# Patient Record
Sex: Male | Born: 1995 | Hispanic: No | Marital: Single
Health system: Southern US, Community
[De-identification: ages and names within clinical notes are randomized; demographics above are authoritative.]

## PROBLEM LIST (undated history)

## (undated) ENCOUNTER — Ambulatory Visit (HOSPITAL_COMMUNITY): Discharge status: Absent without leave | Payer: Self-pay

## (undated) ENCOUNTER — Ambulatory Visit (HOSPITAL_COMMUNITY): Admission: EM | Payer: Self-pay | Source: Home / Self Care

## (undated) DIAGNOSIS — E669 Obesity, unspecified: Secondary | ICD-10-CM

## (undated) DIAGNOSIS — S83106A Unspecified dislocation of unspecified knee, initial encounter: Secondary | ICD-10-CM

## (undated) DIAGNOSIS — F419 Anxiety disorder, unspecified: Secondary | ICD-10-CM

## (undated) HISTORY — DX: Anxiety disorder, unspecified: F41.9

## (undated) HISTORY — PX: TONSILLECTOMY: SUR1361

---
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
## 2016-05-15 ENCOUNTER — Encounter (HOSPITAL_COMMUNITY): Payer: Self-pay

## 2016-05-15 DIAGNOSIS — Z79899 Other long term (current) drug therapy: Secondary | ICD-10-CM | POA: Insufficient documentation

## 2016-05-15 DIAGNOSIS — K6289 Other specified diseases of anus and rectum: Secondary | ICD-10-CM | POA: Insufficient documentation

## 2016-05-15 LAB — CBC
HEMATOCRIT: 45.7 % (ref 39.0–52.0)
Hemoglobin: 14.2 g/dL (ref 13.0–17.0)
MCH: 25.4 pg — AB (ref 26.0–34.0)
MCHC: 31.1 g/dL (ref 30.0–36.0)
MCV: 81.9 fL (ref 78.0–100.0)
Platelets: 559 10*3/uL — ABNORMAL HIGH (ref 150–400)
RBC: 5.58 MIL/uL (ref 4.22–5.81)
RDW: 15.1 % (ref 11.5–15.5)
WBC: 10.6 10*3/uL — AB (ref 4.0–10.5)

## 2016-05-15 LAB — URINALYSIS, ROUTINE W REFLEX MICROSCOPIC
Bilirubin Urine: NEGATIVE
GLUCOSE, UA: NEGATIVE mg/dL
HGB URINE DIPSTICK: NEGATIVE
Ketones, ur: NEGATIVE mg/dL
LEUKOCYTES UA: NEGATIVE
Nitrite: NEGATIVE
PH: 6 (ref 5.0–8.0)
Protein, ur: NEGATIVE mg/dL
SPECIFIC GRAVITY, URINE: 1.026 (ref 1.005–1.030)

## 2016-05-15 LAB — COMPREHENSIVE METABOLIC PANEL
ALT: 23 U/L (ref 17–63)
AST: 21 U/L (ref 15–41)
Albumin: 3.8 g/dL (ref 3.5–5.0)
Alkaline Phosphatase: 80 U/L (ref 38–126)
Anion gap: 5 (ref 5–15)
CHLORIDE: 108 mmol/L (ref 101–111)
CO2: 26 mmol/L (ref 22–32)
Calcium: 9.1 mg/dL (ref 8.9–10.3)
Creatinine, Ser: 0.65 mg/dL (ref 0.61–1.24)
Glucose, Bld: 111 mg/dL — ABNORMAL HIGH (ref 65–99)
POTASSIUM: 3.9 mmol/L (ref 3.5–5.1)
SODIUM: 139 mmol/L (ref 135–145)
Total Bilirubin: 0.2 mg/dL — ABNORMAL LOW (ref 0.3–1.2)
Total Protein: 7 g/dL (ref 6.5–8.1)

## 2016-05-15 LAB — LIPASE, BLOOD: LIPASE: 22 U/L (ref 11–51)

## 2016-05-15 MED ORDER — OXYCODONE-ACETAMINOPHEN 5-325 MG PO TABS
1.0000 | ORAL_TABLET | ORAL | Status: DC | PRN
  Administered 2016-05-15: 1 via ORAL

## 2016-05-15 MED ORDER — OXYCODONE-ACETAMINOPHEN 5-325 MG PO TABS
ORAL_TABLET | ORAL | Status: AC
  Filled 2016-05-15: qty 1

## 2016-05-15 NOTE — ED Triage Notes (Signed)
Pt complaining of abdominal pain and rectal pain x 2 weeks. Pt denies any bleeding. Pt states painful to have bowel movement.

## 2016-05-16 ENCOUNTER — Emergency Department (HOSPITAL_COMMUNITY)
Admission: EM | Admit: 2016-05-16 | Discharge: 2016-05-16 | Disposition: A | Discharge status: Home / Self Care | Payer: Medicaid Other | Attending: Emergency Medicine | Admitting: Emergency Medicine

## 2016-05-16 ENCOUNTER — Emergency Department (HOSPITAL_COMMUNITY): Payer: Medicaid Other

## 2016-05-16 DIAGNOSIS — K6289 Other specified diseases of anus and rectum: Secondary | ICD-10-CM

## 2016-05-16 MED ORDER — ONDANSETRON HCL 4 MG/2ML IJ SOLN
4.0000 mg | Freq: Once | INTRAMUSCULAR | Status: AC
  Administered 2016-05-16: 4 mg via INTRAVENOUS
  Filled 2016-05-16: qty 2

## 2016-05-16 MED ORDER — IOPAMIDOL (ISOVUE-300) INJECTION 61%
INTRAVENOUS | Status: AC
  Administered 2016-05-16: 100 mL
  Filled 2016-05-16: qty 100

## 2016-05-16 MED ORDER — TRAMADOL HCL 50 MG PO TABS: 50.0000 mg | ORAL_TABLET | Freq: Four times a day (QID) | ORAL | 0 refills | Status: DC | PRN

## 2016-05-16 MED ORDER — HYDROCORTISONE ACETATE 25 MG RE SUPP: 25.0000 mg | Freq: Two times a day (BID) | RECTAL | 0 refills | Status: DC

## 2016-05-16 MED ORDER — DOCUSATE SODIUM 100 MG PO CAPS: 100.0000 mg | ORAL_CAPSULE | Freq: Two times a day (BID) | ORAL | 0 refills | Status: DC

## 2016-05-16 MED ORDER — SODIUM CHLORIDE 0.9 % IV BOLUS (SEPSIS)
1000.0000 mL | Freq: Once | INTRAVENOUS | Status: AC
  Administered 2016-05-16: 1000 mL via INTRAVENOUS

## 2016-05-16 MED ORDER — HYDROMORPHONE HCL 1 MG/ML IJ SOLN
1.0000 mg | Freq: Once | INTRAMUSCULAR | Status: AC
  Administered 2016-05-16: 1 mg via INTRAVENOUS
  Filled 2016-05-16: qty 1

## 2016-05-16 NOTE — ED Notes (Signed)
Patient ambulated to the room independently.   

## 2016-05-16 NOTE — ED Provider Notes (Signed)
[facility]-EMERGENCY DEPT Provider Note   CSN: [id number] Arrival date & time: 05/15/16  1914  By signing my name below, I, Emmanuella Mensah, attest that this documentation has been prepared under the direction and in the presence of Gilda Creasehristopher J Halona Amstutz, MD. Electronically Signed: Angelene GiovanniEmmanuella Mensah, ED Scribe. 05/16/16. 1:03 AM.    History   Chief Complaint Chief Complaint  Patient presents with  . Abdominal Pain  . Rectal Pain    HPI Comments: Devin Barker is a 20 y.o. male who presents to the Emergency Department complaining of gradually worsening moderate left lateral side pain that radiates to his left back onset 2 weeks ago. He reports associated nausea and a stabbing, itchy rectal pain. He adds that he has painful BM with blood. No alleviating factors noted. Pt has not tried any medications PTA. No hx of hemorrhoids. He denies any fever, chills, vomiting, diarrhea, SOB, or any generalized rash.    The history is provided by the patient. No language interpreter was used.  Abdominal Pain  Associated symptoms include nausea. Pertinent negatives include fever, diarrhea and vomiting.    Past Medical History:  Diagnosis Date  . Knee dislocation   . Obesity     Patient Active Problem List   Diagnosis Date Noted  . 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 MEDIAL PATELLOFEMORAL LIGAMENT REPAIR Right 10/16/2013   Procedure: ARTHROSCOPY, RIGHT OPEN RECONSTRUCTION AT THE MEDIAL PATELLA FEMORAL LIGAMENT;  Surgeon: Senaida LangeKevin M Supple, MD;  Location: [facility] OR;  Service: Orthopedics;  Laterality: Right;  ANESTHESIA: GENERAL/FEMORAL NERVE BLOCK  . TONSILLECTOMY         Home Medications    Prior to Admission medications   Medication Sig Start Date End Date Taking? Authorizing Provider  diazepam (VALIUM) 5 MG tablet Take 0.5-1 tablets (2.5-5 mg total) by mouth every 6 (six) hours as needed for muscle spasms or  sedation. Patient not taking: Reported on 08/10/2014 10/16/13   Ralene Batheracy Shuford, PA-C  naproxen (NAPROSYN) 500 MG tablet Take 1 tablet (500 mg total) by mouth 2 (two) times daily with a meal. Patient not taking: Reported on 08/10/2014 10/16/13   Ralene Batheracy Shuford, PA-C  oxyCODONE-acetaminophen (PERCOCET) 5-325 MG per tablet Take 1-2 tablets by mouth every 4 (four) hours as needed. Patient not taking: Reported on 08/10/2014 10/16/13   Ralene Batheracy Shuford, PA-C    Family History Family History  Problem Relation Age of Onset  . Diabetes Mother   . Hyperlipidemia Mother   . Hypertension Father   . Kidney disease Sister   . Diabetes Maternal Grandmother   . Diabetes Paternal Grandfather     Social History Social History  Substance Use Topics  . Smoking status: Never Smoker  . Smokeless tobacco: Never Used  . Alcohol use No     Allergies   Other   Review of Systems Review of Systems  Constitutional: Negative for chills and fever.  Respiratory: Negative for shortness of breath.   Gastrointestinal: Positive for abdominal pain and nausea. Negative for diarrhea and vomiting.  Skin: Negative for rash.  All other systems reviewed and are negative.    Physical Exam Updated Vital Signs BP 151/86 (BP Location: Right Arm)   Pulse 91   Temp 98.5 F (36.9 C) (Oral)   Resp 20   Ht 5\' 7"  (1.702 m)   Wt 259 lb 5 oz (117.6 kg)   SpO2 99%   BMI 40.61 kg/m   Physical Exam  Constitutional:  He is oriented to person, place, and time. He appears well-developed and well-nourished. No distress.  HENT:  Head: Normocephalic and atraumatic.  Right Ear: Hearing normal.  Left Ear: Hearing normal.  Nose: Nose normal.  Mouth/Throat: Oropharynx is clear and moist and mucous membranes are normal.  Eyes: Conjunctivae and EOM are normal. Pupils are equal, round, and reactive to light.  Neck: Normal range of motion. Neck supple.  Cardiovascular: Regular rhythm, S1 normal and S2 normal.  Exam reveals no  gallop and no friction rub.   No murmur heard. Pulmonary/Chest: Effort normal and breath sounds normal. No respiratory distress. He exhibits no tenderness.  Abdominal: Soft. Normal appearance and bowel sounds are normal. There is no hepatosplenomegaly. There is no tenderness. There is no rebound, no guarding, no tenderness at McBurney's point and negative Murphy's sign. No hernia.  Genitourinary:  Genitourinary Comments: Chaperone present Peri-anal tenderness without fluctuance, abscess, or mass.   Musculoskeletal: Normal range of motion.  Neurological: He is alert and oriented to person, place, and time. He has normal strength. No cranial nerve deficit or sensory deficit. Coordination normal. GCS eye subscore is 4. GCS verbal subscore is 5. GCS motor subscore is 6.  Skin: Skin is warm, dry and intact. No rash noted. No cyanosis.  Psychiatric: He has a normal mood and affect. His speech is normal and behavior is normal. Thought content normal.  Nursing note and vitals reviewed.    ED Treatments / Results  DIAGNOSTIC STUDIES: Oxygen Saturation is 100% on RA, normal by my interpretation.    COORDINATION OF CARE: 1:02 AM- Pt advised of plan for treatment and pt agrees. Pt will receive lab work and CT scan for further evaluation.    Labs (all labs ordered are listed, but only abnormal results are displayed) Labs Reviewed  COMPREHENSIVE METABOLIC PANEL - Abnormal; Notable for the following:       Result Value   Glucose, Bld 111 (*)    BUN <5 (*)    Total Bilirubin 0.2 (*)    All other components within normal limits  CBC - Abnormal; Notable for the following:    WBC 10.6 (*)    MCH 25.4 (*)    Platelets 559 (*)    All other components within normal limits  LIPASE, BLOOD  URINALYSIS, ROUTINE W REFLEX MICROSCOPIC (NOT AT [facility])    EKG  EKG Interpretation None       Radiology Ct Abdomen Pelvis W Contrast  Result Date: 05/16/2016 CLINICAL DATA:  Nausea and left-sided abdominal  pain. Onset 2 weeks ago. EXAM: CT ABDOMEN AND PELVIS WITH CONTRAST TECHNIQUE: Multidetector CT imaging of the abdomen and pelvis was performed using the standard protocol following bolus administration of intravenous contrast. CONTRAST:  ISOVUE-300 IOPAMIDOL (ISOVUE-300) INJECTION 61% COMPARISON:  None. FINDINGS: Lower chest:  No significant abnormality Hepatobiliary: Mild fatty infiltration of the liver. No focal liver lesions. Gallbladder and bile ducts are unremarkable. Pancreas: Normal Spleen: Normal Adrenals/Urinary Tract: The adrenals and kidneys are normal in appearance. There is no urinary calculus evident. There is no hydronephrosis or ureteral dilatation. Collecting systems and ureters appear unremarkable. The urinary bladder is unremarkable. Stomach/Bowel: There are normal appearances of the stomach, small bowel and colon. The appendix is normal. Vascular/Lymphatic: The abdominal aorta is normal in caliber. There is no atherosclerotic calcification. There is no adenopathy in the abdomen or pelvis. Reproductive: Unremarkable Other: No acute inflammatory changes are evident in the abdomen or pelvis. There is no ascites. Musculoskeletal: No significant skeletal  lesions. IMPRESSION: Fatty infiltration of the liver.  No acute findings are evident. Electronically Signed   By: Ellery Plunk M.D.   On: 05/16/2016 03:54    Procedures Procedures (including critical care time)  Medications Ordered in ED Medications  oxyCODONE-acetaminophen (PERCOCET/ROXICET) 5-325 MG per tablet 1 tablet (1 tablet Oral Given 05/15/16 2152)  sodium chloride 0.9 % bolus 1,000 mL (0 mLs Intravenous Stopped 05/16/16 0232)  ondansetron (ZOFRAN) injection 4 mg (4 mg Intravenous Given 05/16/16 0142)  HYDROmorphone (DILAUDID) injection 1 mg (1 mg Intravenous Given 05/16/16 0142)  iopamidol (ISOVUE-300) 61 % injection (100 mLs  Contrast Given 05/16/16 9604)     Initial Impression / Assessment and Plan / ED Course   Gilda Crease, MD has reviewed the triage vital signs and the nursing notes.  Pertinent labs & imaging results that were available during my care of the patient were reviewed by me and considered in my medical decision making (see chart for details).  Clinical Course   Patient presents to the emergency department with complaints of rectal pain. He reports experiencing bilateral upper abdominal pain with lower back pain. The pain in these areas is mild and now he is experiencing severe and constant pain in his rectum. This pain worsens when he tries to have a bowel movement. He has not noticed any rectal bleeding. Examination did not reveal any external thrombosed or inflamed hemorrhoids. No tenderness of the Botox to suggest soft tissue abscess. He could not tolerate digital rectal exam. I did not appreciate any obvious fissures.  Patient underwent CT scan to further evaluate for the possibility of deep abscess and proctitis. No acute abnormality was noted. Patient will be treated with stool softener, analgesia, rectal steroids. Follow-up with general surgery.  Final Clinical Impressions(s) / ED Diagnoses   Final diagnoses:  Rectal pain    New Prescriptions New Prescriptions   No medications on file   I personally performed the services described in this documentation, which was scribed in my presence. The recorded information has been reviewed and is accurate.    Gilda Crease, MD 05/16/16 [phone]

## 2016-12-08 ENCOUNTER — Emergency Department (HOSPITAL_COMMUNITY): Payer: Medicaid Other

## 2016-12-08 ENCOUNTER — Encounter (HOSPITAL_COMMUNITY): Payer: Self-pay

## 2016-12-08 ENCOUNTER — Emergency Department (HOSPITAL_COMMUNITY)
Admission: EM | Admit: 2016-12-08 | Discharge: 2016-12-08 | Disposition: A | Discharge status: Home / Self Care | Payer: Medicaid Other | Attending: Emergency Medicine | Admitting: Emergency Medicine

## 2016-12-08 DIAGNOSIS — E876 Hypokalemia: Secondary | ICD-10-CM | POA: Diagnosis not present

## 2016-12-08 DIAGNOSIS — R072 Precordial pain: Secondary | ICD-10-CM | POA: Diagnosis not present

## 2016-12-08 DIAGNOSIS — K2960 Other gastritis without bleeding: Secondary | ICD-10-CM

## 2016-12-08 DIAGNOSIS — Z720 Tobacco use: Secondary | ICD-10-CM

## 2016-12-08 DIAGNOSIS — R1012 Left upper quadrant pain: Secondary | ICD-10-CM | POA: Insufficient documentation

## 2016-12-08 DIAGNOSIS — Z79899 Other long term (current) drug therapy: Secondary | ICD-10-CM | POA: Insufficient documentation

## 2016-12-08 DIAGNOSIS — R0602 Shortness of breath: Secondary | ICD-10-CM | POA: Diagnosis not present

## 2016-12-08 DIAGNOSIS — R079 Chest pain, unspecified: Secondary | ICD-10-CM | POA: Diagnosis present

## 2016-12-08 DIAGNOSIS — R110 Nausea: Secondary | ICD-10-CM

## 2016-12-08 LAB — CBC WITH DIFFERENTIAL/PLATELET
BASOS PCT: 1 %
Basophils Absolute: 0.1 10*3/uL (ref 0.0–0.1)
EOS ABS: 0.3 10*3/uL (ref 0.0–0.7)
Eosinophils Relative: 2 %
HCT: 43.7 % (ref 39.0–52.0)
HEMOGLOBIN: 14.5 g/dL (ref 13.0–17.0)
Lymphocytes Relative: 30 %
Lymphs Abs: 3.7 10*3/uL (ref 0.7–4.0)
MCH: 25.8 pg — ABNORMAL LOW (ref 26.0–34.0)
MCHC: 33.2 g/dL (ref 30.0–36.0)
MCV: 77.6 fL — ABNORMAL LOW (ref 78.0–100.0)
MONOS PCT: 6 %
Monocytes Absolute: 0.7 10*3/uL (ref 0.1–1.0)
NEUTROS PCT: 61 %
Neutro Abs: 7.5 10*3/uL (ref 1.7–7.7)
Platelets: 473 10*3/uL — ABNORMAL HIGH (ref 150–400)
RBC: 5.63 MIL/uL (ref 4.22–5.81)
RDW: 16.1 % — ABNORMAL HIGH (ref 11.5–15.5)
WBC: 12.2 10*3/uL — AB (ref 4.0–10.5)

## 2016-12-08 LAB — COMPREHENSIVE METABOLIC PANEL
ALK PHOS: 70 U/L (ref 38–126)
ALT: 23 U/L (ref 17–63)
ANION GAP: 9 (ref 5–15)
AST: 18 U/L (ref 15–41)
Albumin: 4.1 g/dL (ref 3.5–5.0)
BILIRUBIN TOTAL: 0.5 mg/dL (ref 0.3–1.2)
BUN: 8 mg/dL (ref 6–20)
CALCIUM: 8.9 mg/dL (ref 8.9–10.3)
CO2: 21 mmol/L — ABNORMAL LOW (ref 22–32)
Chloride: 101 mmol/L (ref 101–111)
Creatinine, Ser: 0.5 mg/dL — ABNORMAL LOW (ref 0.61–1.24)
Glucose, Bld: 67 mg/dL (ref 65–99)
Potassium: 3.3 mmol/L — ABNORMAL LOW (ref 3.5–5.1)
Sodium: 131 mmol/L — ABNORMAL LOW (ref 135–145)
TOTAL PROTEIN: 8.1 g/dL (ref 6.5–8.1)

## 2016-12-08 LAB — I-STAT TROPONIN, ED: Troponin i, poc: 0 ng/mL (ref 0.00–0.08)

## 2016-12-08 LAB — D-DIMER, QUANTITATIVE: D-Dimer, Quant: <0.27 ug{FEU}/mL (ref 0.00–0.50)

## 2016-12-08 LAB — LIPASE, BLOOD: Lipase: 15 U/L (ref 11–51)

## 2016-12-08 MED ORDER — ASPIRIN 81 MG PO CHEW
324.0000 mg | CHEWABLE_TABLET | Freq: Once | ORAL | Status: AC
  Administered 2016-12-08: 324 mg via ORAL
  Filled 2016-12-08: qty 4

## 2016-12-08 MED ORDER — POTASSIUM CHLORIDE CRYS ER 20 MEQ PO TBCR
40.0000 meq | EXTENDED_RELEASE_TABLET | Freq: Once | ORAL | Status: AC
  Administered 2016-12-08: 40 meq via ORAL
  Filled 2016-12-08: qty 2

## 2016-12-08 MED ORDER — SODIUM CHLORIDE 0.9 % IV BOLUS (SEPSIS)
1000.0000 mL | Freq: Once | INTRAVENOUS | Status: AC
  Administered 2016-12-08: 1000 mL via INTRAVENOUS

## 2016-12-08 MED ORDER — ONDANSETRON HCL 4 MG/2ML IJ SOLN
4.0000 mg | Freq: Once | INTRAMUSCULAR | Status: AC
  Administered 2016-12-08: 4 mg via INTRAVENOUS
  Filled 2016-12-08: qty 2

## 2016-12-08 MED ORDER — GI COCKTAIL ~~LOC~~
30.0000 mL | Freq: Once | ORAL | Status: AC
  Administered 2016-12-08: 30 mL via ORAL
  Filled 2016-12-08: qty 30

## 2016-12-08 MED ORDER — ONDANSETRON 4 MG PO TBDP: 4.0000 mg | ORAL_TABLET | Freq: Three times a day (TID) | ORAL | 0 refills | Status: DC | PRN

## 2016-12-08 NOTE — Discharge Instructions (Signed)
Your labs, xray, and EKG are all reassuring, your chest pain could be a variety of things including gas pain, muscle pain, indigestion, and other nonemergent issues. You may want to consider using over the counter zantac as needed for symptom relief; Avoid spicy/fatty/acidic foods, avoid soda/coffee/tea/alcohol. Avoid laying down flat within 30 minutes of eating. Avoid NSAIDs like ibuprofen/aleve/motrin/etc on an empty stomach. May consider using over the counter tums/maalox as needed for additional relief. Use zofran as directed as needed for nausea. Use tylenol as needed for pain. You may consider using heat to the areas of pain, no more than 20 minutes every hour. STOP SMOKING! See the list of foods below to help supplement your diet with potassium rich foods. Follow up with your regular doctor in 1 week for recheck of symptoms. Return to the ER for changes or worsening symptoms.  SEEK IMMEDIATE MEDICAL ATTENTION IF: You develop a fever.  Your chest pains become severe or intolerable.  You develop new, unexplained symptoms (problems).  You develop shortness of breath, nausea, vomiting, sweating or feel light headed.  You develop a new cough or you cough up blood. You develop new leg swelling

## 2016-12-08 NOTE — ED Notes (Signed)
Unable to collect labs at this time PT in xray 

## 2016-12-08 NOTE — ED Provider Notes (Signed)
[facility]-EMERGENCY DEPT Provider Note   CSN: [id number] Arrival date & time: 12/08/16  1422     History   Chief Complaint Chief Complaint  Patient presents with  . Chest Pain    HPI Devin Barker is a 21 y.o. male with a PMHx of obesity, who presents to the ED with complaints of gradual onset left-sided chest pain that began last night around 10 PM while he was in bed, was intermittent until this morning when he became more constant. He describes the pain as 10/10 sharp initially intermittent and now constant left-sided chest pain radiating into the left shoulder and neck area, worse with movement, and with no treatments tried prior to arrival. He denies any change with exertion or inspiration. He reports associated intermittent lightheadedness with no known triggers, as well as nausea. He also states that the pain "takes his breath away" and thus he feels 'short of breath' due to pain, but denies actually having difficulty breathing or a genuine short of breath symptom. He states that last night his upper abdomen hurt as well, but this has subsided and has not returned.  He denies diaphoresis, fevers, chills, cough, true SOB, wheezing, hemoptysis, LE swelling, recent travel/surgery/immobilization, personal/family hx of DVT/PE, ongoing abd pain, V/D/C, hematochezia, melena, hematuria, dysuria, myalgias, arthralgias, claudication, orthopnea, numbness, tingling, focal weakness, or any other complaints at this time. +Smoker. No FHx of cardiac disease. Denies EtOH intake or NSAID use. Denies current caffeine intake, however he states he used to drink energy drinks, stopped drinking them ~1 month ago. Denies any heavy lifting.    The history is provided by the patient and medical records. No language interpreter was used.  Chest Pain   This is a new problem. The current episode started yesterday. The problem occurs constantly. The problem has not changed since onset.The pain is associated  with rest. The pain is present in the lateral region. The pain is at a severity of 10/10. The pain is severe. The quality of the pain is described as sharp. The pain radiates to the left neck and left shoulder. Duration of episode(s) is 1 day. Exacerbated by: movement. Associated symptoms include nausea and shortness of breath (pain "takes breath away" but denies SOB). Pertinent negatives include no abdominal pain, no claudication, no cough, no diaphoresis, no fever, no hemoptysis, no lower extremity edema, no numbness, no orthopnea, no vomiting and no weakness. He has tried nothing for the symptoms. The treatment provided no relief. Risk factors include male gender, obesity and smoking/tobacco exposure.  Pertinent negatives for past medical history include no diabetes, no DVT, no hyperlipidemia, no hypertension and no PE.  Pertinent negatives for family medical history include: no CAD, no early MI and no PE.    Past Medical History:  Diagnosis Date  . Knee dislocation   . Obesity     Patient Active Problem List   Diagnosis Date Noted  . 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 MEDIAL PATELLOFEMORAL LIGAMENT REPAIR Right 10/16/2013   Procedure: ARTHROSCOPY, RIGHT OPEN RECONSTRUCTION AT THE MEDIAL PATELLA FEMORAL LIGAMENT;  Surgeon: Senaida Lange, MD;  Location: [facility] OR;  Service: Orthopedics;  Laterality: Right;  ANESTHESIA: GENERAL/FEMORAL NERVE BLOCK  . TONSILLECTOMY         Home Medications    Prior to Admission medications   Medication Sig Start Date End Date Taking? Authorizing Provider  diazepam (VALIUM) 5 MG tablet Take 0.5-1 tablets (2.5-5 mg total) by  mouth every 6 (six) hours as needed for muscle spasms or sedation. Patient not taking: Reported on 08/10/2014 10/16/13   Ralene Bathe, PA-C  docusate sodium (COLACE) 100 MG capsule Take 1 capsule (100 mg total) by mouth every 12 (twelve) hours. 05/16/16   Gilda Crease, MD  hydrocortisone (ANUSOL-HC) 25 MG suppository Place 1 suppository (25 mg total) rectally 2 (two) times daily. For 7 days 05/16/16   Gilda Crease, MD  naproxen (NAPROSYN) 500 MG tablet Take 1 tablet (500 mg total) by mouth 2 (two) times daily with a meal. Patient not taking: Reported on 08/10/2014 10/16/13   Ralene Bathe, PA-C  oxyCODONE-acetaminophen (PERCOCET) 5-325 MG per tablet Take 1-2 tablets by mouth every 4 (four) hours as needed. Patient not taking: Reported on 08/10/2014 10/16/13   Ralene Bathe, PA-C  traMADol (ULTRAM) 50 MG tablet Take 1 tablet (50 mg total) by mouth every 6 (six) hours as needed. 05/16/16   Gilda Crease, MD    Family History Family History  Problem Relation Age of Onset  . Diabetes Mother   . Hyperlipidemia Mother   . Hypertension Father   . Kidney disease Sister   . Diabetes Maternal Grandmother   . Diabetes Paternal Grandfather     Social History Social History  Substance Use Topics  . Smoking status: Never Smoker  . Smokeless tobacco: Never Used  . Alcohol use No     Allergies   Other   Review of Systems Review of Systems  Constitutional: Negative for chills, diaphoresis and fever.  Respiratory: Positive for shortness of breath (pain "takes breath away" but denies SOB). Negative for cough, hemoptysis and wheezing.   Cardiovascular: Positive for chest pain. Negative for orthopnea, claudication and leg swelling.  Gastrointestinal: Positive for nausea. Negative for abdominal pain, blood in stool, constipation, diarrhea and vomiting.  Genitourinary: Negative for dysuria and hematuria.  Musculoskeletal: Negative for arthralgias and myalgias.  Skin: Negative for color change.  Allergic/Immunologic: Negative for immunocompromised state.  Neurological: Positive for light-headedness (intermittent). Negative for weakness and numbness.  Psychiatric/Behavioral: Negative for confusion.   10 Systems reviewed and are negative  for acute change except as noted in the HPI.   Physical Exam Updated Vital Signs BP (!) 141/66 (BP Location: Right Arm)   Pulse 100   Temp 97.9 F (36.6 C) (Oral)   Resp 18   Ht 5\' 7"  (1.702 m)   Wt 113.4 kg   SpO2 100%   BMI 39.16 kg/m   Physical Exam  Constitutional: He is oriented to person, place, and time. Vital signs are normal. He appears well-developed and well-nourished.  Non-toxic appearance. No distress.  Afebrile, nontoxic, NAD although mildly anxious appearing  HENT:  Head: Normocephalic and atraumatic.  Mouth/Throat: Oropharynx is clear and moist and mucous membranes are normal.  Eyes: Conjunctivae and EOM are normal. Right eye exhibits no discharge. Left eye exhibits no discharge.  Neck: Normal range of motion. Neck supple.  Cardiovascular: Regular rhythm, normal heart sounds and intact distal pulses.  Tachycardia present.  Exam reveals no gallop and no friction rub.   No murmur heard. Mildly tachycardic in low 100s. Reg rhythm, nl s1/s2, no m/r/g, distal pulses intact, no pedal edema   Pulmonary/Chest: Effort normal and breath sounds normal. No respiratory distress. He has no decreased breath sounds. He has no wheezes. He has no rhonchi. He has no rales. He exhibits tenderness. He exhibits no crepitus, no deformity and no retraction.    CTAB in all  lung fields, no w/r/r, no hypoxia or increased WOB, speaking in full sentences, SpO2 100% on RA Chest wall with mild L anterior TTP, without crepitus, deformities, or retractions   Abdominal: Soft. Normal appearance and bowel sounds are normal. He exhibits no distension. There is tenderness in the epigastric area and left upper quadrant. There is no rigidity, no rebound, no guarding, no CVA tenderness, no tenderness at McBurney's point and negative Murphy's sign.    Soft, nondistended, +BS throughout, with mild LUQ and epigastric TTP, no r/g/r, neg murphy's, neg mcburney's, no CVA TTP   Musculoskeletal: Normal range of  motion.  MAE x4 Strength and sensation grossly intact in all extremities Distal pulses intact Gait steady No pedal edema, neg homan's bilaterally   Neurological: He is alert and oriented to person, place, and time. He has normal strength. No sensory deficit.  Skin: Skin is warm, dry and intact. No rash noted.  Psychiatric: His mood appears anxious.  Anxious appearing  Nursing note and vitals reviewed.    ED Treatments / Results  Labs (all labs ordered are listed, but only abnormal results are displayed) Labs Reviewed  COMPREHENSIVE METABOLIC PANEL - Abnormal; Notable for the following:       Result Value   Sodium 131 (*)    Potassium 3.3 (*)    CO2 21 (*)    Creatinine, Ser 0.50 (*)    All other components within normal limits  CBC WITH DIFFERENTIAL/PLATELET - Abnormal; Notable for the following:    WBC 12.2 (*)    MCV 77.6 (*)    MCH 25.8 (*)    RDW 16.1 (*)    Platelets 473 (*)    All other components within normal limits  D-DIMER, QUANTITATIVE (NOT AT [facility])  LIPASE, BLOOD  I-STAT TROPOININ, ED    EKG  EKG Interpretation  Date/Time:  Friday December 08 2016 14:37:35 EDT Ventricular Rate:  101 PR Interval:    QRS Duration: 85 QT Interval:  328 QTC Calculation: 426 R Axis:   72 Text Interpretation:  Sinus tachycardia ST elev, probable normal early repol pattern Benign early repolarization no prior EKG  Confirmed by LIU MD, DANA [phone]([id number]) on 12/08/2016 3:52:23 PM       Radiology Dg Chest 2 View  Result Date: 12/08/2016 CLINICAL DATA:  Left chest pain. EXAM: CHEST  2 VIEW COMPARISON:  None. FINDINGS: The cardiomediastinal silhouette is within normal limits. The lungs are well inflated and clear. There is no evidence of pleural effusion or pneumothorax. No acute osseous abnormality is identified. IMPRESSION: No active cardiopulmonary disease. Electronically Signed   By: Sebastian AcheAllen  Grady M.D.   On: 12/08/2016 15:04    Procedures Procedures (including critical care  time)  Medications Ordered in ED Medications  potassium chloride SA (K-DUR,KLOR-CON) CR tablet 40 mEq (not administered)  sodium chloride 0.9 % bolus 1,000 mL (1,000 mLs Intravenous New Bag/Given 12/08/16 1559)  ondansetron (ZOFRAN) injection 4 mg (4 mg Intravenous Given 12/08/16 1558)  aspirin chewable tablet 324 mg (324 mg Oral Given 12/08/16 1545)  gi cocktail (Maalox,Lidocaine,Donnatal) (30 mLs Oral Given 12/08/16 1546)     Initial Impression / Assessment and Plan / ED Course  I have reviewed the triage vital signs and the nursing notes.  Pertinent labs & imaging results that were available during my care of the patient were reviewed by me and considered in my medical decision making (see chart for details).     21 y.o. male here with intermittent CP that began yesterday,  reports intermittent lightheadedness and nausea, and at times the pain feels like it takes his breath away however he denies feeling short of breath. On exam, appears anxious, mildly tachycardic in the low-100s, no hypoxia, distal pulses symmetric and intact, no LE swelling, with mild LUQ and epigastric TTP and L chest wall TTP. EKG without acute ischemic findings, with benign repolarization, and sinus tachycardia HR 101. CXR neg. Trop negative. Given tachycardia, PERC+, will obtain D-dimer given low-risk. Will get CBC w/diff, CMP, and lipase. Will give ASA, GI cocktail, zofran, and fluids. Will reassess shortly.  4:32 PM D-dimer negative, in this low-risk pt doubt further concern of PE; tachycardia improving with fluids. CBC w/diff with mildly elevated WBC 12.2 however differential unremarkable; plt 473 which is similar to prior values. CMP with mildly low K 3.3, will replete; also mildly low Na 131, unclear etiology, doubt clinical significance. Lipase WNL. Pt feeling better. Given that Trop neg at >12hr mark, doubt need for repeat troponin. Pain likely related to musculoskeletal vs GI related etiology; vs anxiety related.  Advised pt to stay well hydrated, list of potassium rich foods given, advised tylenol/motrin for pain, Smoking cessation advised. Discussed gastritis/GERD diet modifications and OTC remedies as well. Rx zofran given. F/up with PCP in 1wk for recheck. I explained the diagnosis and have given explicit precautions to return to the ER including for any other new or worsening symptoms. The patient understands and accepts the medical plan as it's been dictated and I have answered their questions. Discharge instructions concerning home care and prescriptions have been given. The patient is STABLE and is discharged to home in good condition.   Final Clinical Impressions(s) / ED Diagnoses   Final diagnoses:  Precordial chest pain  Abdominal pain, LUQ (left upper quadrant)  Nausea  SOB (shortness of breath)  Tobacco user  Hypokalemia  Other specified gastritis, presence of bleeding unspecified, unspecified chronicity    New Prescriptions New Prescriptions   ONDANSETRON (ZOFRAN ODT) 4 MG DISINTEGRATING TABLET    Take 1 tablet (4 mg total) by mouth every 8 (eight) hours as needed for nausea or vomiting.     [address] PA-C 12/08/16 1641    Lavera Guise, MD 12/09/16 [phone]

## 2016-12-08 NOTE — ED Triage Notes (Signed)
Pt went to urgent care for chest pain starting last night.  Pt describes pain as center chest to neck.  Denies cough/congestion/fever.  Pt states some shortness of breath and feels tight.  Denies lifting heavy object but does state more pain with movement.

## 2017-04-27 ENCOUNTER — Emergency Department (HOSPITAL_COMMUNITY): Payer: Medicaid Other

## 2017-04-27 ENCOUNTER — Encounter (HOSPITAL_COMMUNITY): Payer: Self-pay | Admitting: *Deleted

## 2017-04-27 ENCOUNTER — Emergency Department (HOSPITAL_COMMUNITY)
Admission: EM | Admit: 2017-04-27 | Discharge: 2017-04-27 | Disposition: A | Discharge status: Home / Self Care | Payer: Medicaid Other | Attending: Emergency Medicine | Admitting: Emergency Medicine

## 2017-04-27 DIAGNOSIS — Y92410 Unspecified street and highway as the place of occurrence of the external cause: Secondary | ICD-10-CM | POA: Insufficient documentation

## 2017-04-27 DIAGNOSIS — S20212A Contusion of left front wall of thorax, initial encounter: Secondary | ICD-10-CM

## 2017-04-27 DIAGNOSIS — Y939 Activity, unspecified: Secondary | ICD-10-CM | POA: Diagnosis not present

## 2017-04-27 DIAGNOSIS — R109 Unspecified abdominal pain: Secondary | ICD-10-CM | POA: Insufficient documentation

## 2017-04-27 DIAGNOSIS — R51 Headache: Secondary | ICD-10-CM | POA: Insufficient documentation

## 2017-04-27 DIAGNOSIS — M542 Cervicalgia: Secondary | ICD-10-CM | POA: Insufficient documentation

## 2017-04-27 DIAGNOSIS — S060X1A Concussion with loss of consciousness of 30 minutes or less, initial encounter: Secondary | ICD-10-CM | POA: Diagnosis present

## 2017-04-27 DIAGNOSIS — Y999 Unspecified external cause status: Secondary | ICD-10-CM | POA: Insufficient documentation

## 2017-04-27 LAB — CBG MONITORING, ED: Glucose-Capillary: 93 mg/dL (ref 65–99)

## 2017-04-27 MED ORDER — ACETAMINOPHEN 500 MG PO TABS
1000.0000 mg | ORAL_TABLET | Freq: Once | ORAL | Status: AC
  Administered 2017-04-27: 1000 mg via ORAL
  Filled 2017-04-27: qty 2

## 2017-04-27 MED ORDER — IOPAMIDOL (ISOVUE-300) INJECTION 61%
INTRAVENOUS | Status: AC
  Administered 2017-04-27: 100 mL
  Filled 2017-04-27: qty 100

## 2017-04-27 NOTE — ED Notes (Signed)
Patient transported to CT 
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
## 2017-04-27 NOTE — ED Notes (Signed)
Dr. Denton LankSteinl made aware of patients change in mental status and that patient is harder to arouse than before.

## 2017-04-27 NOTE — ED Notes (Signed)
Pt ambulated in hallway with assistance and steady gait
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
## 2017-04-27 NOTE — Discharge Instructions (Signed)
It was our pleasure to provide your ER care today - we hope that you feel better.  Rest. Take acetaminophen and/or ibuprofen as need for pain.  Follow up with primary care doctor in 1 week if symptoms fail to improve/resolve.  Return to ER if worse, new symptoms, new/severe pain, other concern.
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
## 2022-04-06 ENCOUNTER — Other Ambulatory Visit: Payer: Self-pay | Admitting: Internal Medicine

## 2022-04-07 LAB — COMPLETE METABOLIC PANEL WITH GFR
AG Ratio: 1.3 (calc) (ref 1.0–2.5)
ALT: 20 U/L (ref 9–46)
AST: 19 U/L (ref 10–40)
Albumin: 4.3 g/dL (ref 3.6–5.1)
Alkaline phosphatase (APISO): 73 U/L (ref 36–130)
BUN: 7 mg/dL (ref 7–25)
CO2: 22 mmol/L (ref 20–32)
Calcium: 9.5 mg/dL (ref 8.6–10.3)
Chloride: 103 mmol/L (ref 98–110)
Creat: 0.61 mg/dL (ref 0.60–1.24)
Globulin: 3.4 g/dL (calc) (ref 1.9–3.7)
Glucose, Bld: 59 mg/dL — ABNORMAL LOW (ref 65–99)
Potassium: 4.6 mmol/L (ref 3.5–5.3)
Sodium: 140 mmol/L (ref 135–146)
Total Bilirubin: 0.4 mg/dL (ref 0.2–1.2)
Total Protein: 7.7 g/dL (ref 6.1–8.1)
eGFR: 137 mL/min/{1.73_m2} (ref >=60)

## 2022-04-07 LAB — LIPID PANEL
Cholesterol: 157 mg/dL (ref <200)
HDL: 60 mg/dL (ref >=40)
LDL Cholesterol (Calc): 84 mg/dL (calc)
Non-HDL Cholesterol (Calc): 97 mg/dL (calc) (ref <130)
Total CHOL/HDL Ratio: 2.6 (calc) (ref <5.0)
Triglycerides: 57 mg/dL (ref <150)

## 2022-04-07 LAB — CBC
HCT: 46.3 % (ref 38.5–50.0)
Hemoglobin: 15.1 g/dL (ref 13.2–17.1)
MCH: 28.3 pg (ref 27.0–33.0)
MCHC: 32.6 g/dL (ref 32.0–36.0)
MCV: 86.9 fL (ref 80.0–100.0)
MPV: 9.2 fL (ref 7.5–12.5)
Platelets: 482 10*3/uL — ABNORMAL HIGH (ref 140–400)
RBC: 5.33 10*6/uL (ref 4.20–5.80)
RDW: 13 % (ref 11.0–15.0)
WBC: 14.7 10*3/uL — ABNORMAL HIGH (ref 3.8–10.8)

## 2022-04-07 LAB — TSH: TSH: 1.93 mIU/L (ref 0.40–4.50)

## 2022-05-01 ENCOUNTER — Emergency Department (HOSPITAL_COMMUNITY)
Admission: EM | Admit: 2022-05-01 | Discharge: 2022-05-01 | Disposition: A | Discharge status: Home / Self Care | Payer: Self-pay | Attending: Emergency Medicine | Admitting: Emergency Medicine

## 2022-05-01 ENCOUNTER — Emergency Department (HOSPITAL_COMMUNITY): Payer: Self-pay

## 2022-05-01 ENCOUNTER — Other Ambulatory Visit: Payer: Self-pay

## 2022-05-01 DIAGNOSIS — R1031 Right lower quadrant pain: Secondary | ICD-10-CM | POA: Insufficient documentation

## 2022-05-01 DIAGNOSIS — R109 Unspecified abdominal pain: Secondary | ICD-10-CM

## 2022-05-01 DIAGNOSIS — D72829 Elevated white blood cell count, unspecified: Secondary | ICD-10-CM | POA: Insufficient documentation

## 2022-05-01 LAB — URINALYSIS, ROUTINE W REFLEX MICROSCOPIC
Bilirubin Urine: NEGATIVE
Glucose, UA: NEGATIVE mg/dL
Hgb urine dipstick: NEGATIVE
Ketones, ur: 5 mg/dL — AB
Leukocytes,Ua: NEGATIVE
Nitrite: NEGATIVE
Protein, ur: NEGATIVE mg/dL
Specific Gravity, Urine: 1.016 (ref 1.005–1.030)
pH: 8 (ref 5.0–8.0)

## 2022-05-01 LAB — RAPID URINE DRUG SCREEN, HOSP PERFORMED
Amphetamines: NOT DETECTED
Barbiturates: NOT DETECTED
Benzodiazepines: NOT DETECTED
Cocaine: NOT DETECTED
Opiates: POSITIVE — AB
Tetrahydrocannabinol: POSITIVE — AB

## 2022-05-01 LAB — CBC
HCT: 48.7 % (ref 39.0–52.0)
Hemoglobin: 15.7 g/dL (ref 13.0–17.0)
MCH: 28 pg (ref 26.0–34.0)
MCHC: 32.2 g/dL (ref 30.0–36.0)
MCV: 86.8 fL (ref 80.0–100.0)
Platelets: 536 10*3/uL — ABNORMAL HIGH (ref 150–400)
RBC: 5.61 MIL/uL (ref 4.22–5.81)
RDW: 13.9 % (ref 11.5–15.5)
WBC: 12.8 10*3/uL — ABNORMAL HIGH (ref 4.0–10.5)
nRBC: 0 % (ref 0.0–0.2)

## 2022-05-01 LAB — BASIC METABOLIC PANEL
Anion gap: 11 (ref 5–15)
BUN: 8 mg/dL (ref 6–20)
CO2: 21 mmol/L — ABNORMAL LOW (ref 22–32)
Calcium: 9.1 mg/dL (ref 8.9–10.3)
Chloride: 107 mmol/L (ref 98–111)
Creatinine, Ser: 0.86 mg/dL (ref 0.61–1.24)
GFR, Estimated: >60 mL/min (ref >60)
Glucose, Bld: 96 mg/dL (ref 70–99)
Potassium: 3.8 mmol/L (ref 3.5–5.1)
Sodium: 139 mmol/L (ref 135–145)

## 2022-05-01 LAB — ETHANOL: Alcohol, Ethyl (B): <10 mg/dL (ref <10)

## 2022-05-01 LAB — LIPASE, BLOOD: Lipase: 24 U/L (ref 11–51)

## 2022-05-01 MED ORDER — LACTATED RINGERS IV BOLUS
1000.0000 mL | Freq: Once | INTRAVENOUS | Status: AC
  Administered 2022-05-01: 1000 mL via INTRAVENOUS

## 2022-05-01 MED ORDER — HYDROMORPHONE HCL 1 MG/ML IJ SOLN
1.0000 mg | Freq: Once | INTRAMUSCULAR | Status: AC
  Administered 2022-05-01: 1 mg via INTRAVENOUS
  Filled 2022-05-01: qty 1

## 2022-05-01 MED ORDER — DROPERIDOL 2.5 MG/ML IJ SOLN
1.2500 mg | Freq: Once | INTRAMUSCULAR | Status: AC
  Administered 2022-05-01: 1.25 mg via INTRAVENOUS
  Filled 2022-05-01: qty 2

## 2022-05-01 MED ORDER — FENTANYL CITRATE PF 50 MCG/ML IJ SOSY
100.0000 ug | PREFILLED_SYRINGE | Freq: Once | INTRAMUSCULAR | Status: AC
  Administered 2022-05-01: 100 ug via INTRAVENOUS
  Filled 2022-05-01: qty 2

## 2022-05-01 MED ORDER — LORAZEPAM 1 MG PO TABS
2.0000 mg | ORAL_TABLET | Freq: Once | ORAL | Status: AC
  Administered 2022-05-01: 2 mg via ORAL
  Filled 2022-05-01: qty 2

## 2022-05-01 MED ORDER — ONDANSETRON HCL 4 MG/2ML IJ SOLN
4.0000 mg | Freq: Once | INTRAMUSCULAR | Status: AC
  Administered 2022-05-01: 4 mg via INTRAVENOUS
  Filled 2022-05-01: qty 2

## 2022-05-01 NOTE — ED Triage Notes (Signed)
Pt bib [facility] c/o Rt abd pain radiating to back. Upon arrival pain is now in back. Pt endorses n/v this am. Pt has no past medical hx. Pt endorses painful and difficulty urinating.   EMS Vitals 158/98 BP 118 HR 95 O2 18 RR 96 CBG

## 2022-05-01 NOTE — ED Provider Notes (Signed)
[facility] EMERGENCY DEPARTMENT Provider Note   CSN: [id number] Arrival date & time: 05/01/22  0719     History  Chief Complaint  Patient presents with   Abdominal Pain    Devin Barker is a 26 y.o. male who with no prior past medical history presents the emergency department with gradual onset of right lower quadrant and flank pain for the past several hours.  The patient states that this morning he had experienced gradual onset of right lower abdominal pain and flank pain that has been intermittent in nature since.  He states that he feels as though his urine has been darker than normal over the past 24 hours.  He is not sure whether or not he has had hematuria.  He has never had a kidney stone in the past although notes that he has a significant family history of renal calculi.  Has had no preceding fevers or chills.    Abdominal Pain Associated symptoms: no chest pain, no chills, no cough, no dysuria, no fever, no hematuria, no shortness of breath, no sore throat and no vomiting        Home Medications Prior to Admission medications   Not on File      Allergies    Patient has no allergy information on record.    Review of Systems   Review of Systems  Constitutional:  Negative for chills and fever.  HENT:  Negative for ear pain and sore throat.   Eyes:  Negative for pain and visual disturbance.  Respiratory:  Negative for cough and shortness of breath.   Cardiovascular:  Negative for chest pain and palpitations.  Gastrointestinal:  Positive for abdominal pain. Negative for vomiting.  Genitourinary:  Positive for flank pain. Negative for dysuria and hematuria.  Musculoskeletal:  Negative for arthralgias and back pain.  Skin:  Negative for color change and rash.  Neurological:  Negative for seizures and syncope.  All other systems reviewed and are negative.   Physical Exam Updated Vital Signs There were no vitals taken for this  visit. Physical Exam Vitals and nursing note reviewed.  Constitutional:      General: He is in acute distress.     Appearance: He is well-developed. He is obese.     Comments: Upon entering the exam room, the patient is intermittently yelling and pain and is in apparent distress.  HENT:     Head: Normocephalic and atraumatic.  Eyes:     Conjunctiva/sclera: Conjunctivae normal.  Cardiovascular:     Rate and Rhythm: Normal rate and regular rhythm.     Heart sounds: No murmur heard. Pulmonary:     Effort: Pulmonary effort is normal. No respiratory distress.     Breath sounds: Normal breath sounds.  Abdominal:     Palpations: Abdomen is soft.     Tenderness: There is abdominal tenderness. There is right CVA tenderness.     Comments: Significant predominant right flank tenderness without frank peritonitis.  The patient will intermittently voluntarily guard.  No rebound.  Musculoskeletal:        General: No swelling.     Cervical back: Neck supple.  Skin:    General: Skin is warm and dry.     Capillary Refill: Capillary refill takes less than 2 seconds.  Neurological:     Mental Status: He is alert.     Comments: Fully alert and oriented moving all extremities spontaneously grossly neurologically intact  Psychiatric:  Mood and Affect: Mood normal.     ED Results / Procedures / Treatments   Labs (all labs ordered are listed, but only abnormal results are displayed) Labs Reviewed - No data to display  EKG None  Radiology No results found.  Procedures Procedures    Medications Ordered in ED Medications - No data to display  ED Course/ Medical Decision Making/ A&P                           Medical Decision Making Amount and/or Complexity of Data Reviewed Labs: ordered. Radiology: ordered and independent interpretation performed. Decision-making details documented in ED Course.  Risk Prescription drug management.   The patient presents to the emergency  department hemodynamically stable, afebrile and with several hours of intermittent severe right flank pain associated with patient reported dark urine and family history of kidney stones.  Differential diagnosis includes renal calculus versus less likely appendicitis given urinary changes and positive family history as above as well as predominant flank tenderness was opposed to right lower quadrant tenderness.  Less likely perforated viscus given absence of frank diffuse peritonitis and focalized findings as above.  Will provide 1 mg intravenous Dilaudid, Zofran, lactated ringer bolus and obtain CT stone study  Serial reassessment reveals that the patient is persistently in severe pain.  Patient was given additional 100 mcg of fentanyl.  Additional reassessment reveals that the patient has felt some relief with the pain medicine.  Blood pressure cuff is off and the patient's blood pressure reading of 90s over 50s is an accurate.  His mentation is the same as prior.  I have personally reviewed and interpreted the patient's stone study which was negative for any acute intra-abdominal pathology specifically stone.  work-up thus far is remarkable for mild leukocytosis only.  Additional reassessment reveals that the patient is yelling in pain and writhing around in bed.  He states that his abdominal pain has moved from his right flank to his left side and that it continues to move "all around my belly."  Patient was given additional milligram of Dilaudid.  Additional reassessment reveals that the patient's pain is now well controlled.  I have reviewed the patient's entire radiographic and laboratory work-up with him and family at bedside and explained and answered all questions.  Feel that the patient is appropriate for discharge at this time.  Patient and family have verbalized agreement and understanding of plan to discharge with instructions to follow-up with primary care physician in the next several  days.  Patient and family were given strict return precautions for inability to tolerate oral intake or any new symptom moving forward.        Final Clinical Impression(s) / ED Diagnoses Final diagnoses:  Abdominal pain, unspecified abdominal location    Rx / DC Orders ED Discharge Orders     None         Duard Brady, MD 05/01/22 1351    Lorre Nick, MD 05/02/22 [phone]

## 2022-05-01 NOTE — ED Provider Notes (Signed)
I saw and evaluated the patient, reviewed the resident's note and I agree with the findings and plan.   26 year old male who presents with cute onset of right-sided flank pain.  No pain or swelling to his testicles.  Clinical findings concerning for possible kidney stone.  We have medicated for pain here.  We will check urinalysis as well as CT renal stone.   Lorre Nick, MD 05/01/22 [phone]

## 2022-05-01 NOTE — ED Notes (Signed)
Pt requesting pain medication. MD notified. No new orders.

## 2022-05-01 NOTE — Discharge Instructions (Addendum)
You were seen in the emergency department today for abdominal pain.  We did a CT scan that did not show any kidney stone, infection, or anything else that requires immediate attention.  We think you are safe to go home at this time as your blood work and urine also look good.  Please take Tylenol and ibuprofen as needed for pain.  If you are unable to keep anything down by mouth or something changes significantly please come back to the emergency department.  Otherwise would like you to follow-up with your primary care doctor in the next several days.

## 2022-05-02 LAB — URINE CULTURE: Culture: 40000 — AB

## 2022-07-30 ENCOUNTER — Encounter (HOSPITAL_COMMUNITY): Payer: Self-pay | Admitting: Emergency Medicine

## 2022-07-30 ENCOUNTER — Emergency Department (HOSPITAL_COMMUNITY): Payer: BLUE CROSS/BLUE SHIELD

## 2022-07-30 ENCOUNTER — Other Ambulatory Visit: Payer: Self-pay

## 2022-07-30 ENCOUNTER — Inpatient Hospital Stay (HOSPITAL_COMMUNITY)
Admission: EM | Admit: 2022-07-30 | Discharge: 2022-08-22 | DRG: 024 | Disposition: A | Discharge status: Home / Self Care | Payer: BLUE CROSS/BLUE SHIELD | Attending: Internal Medicine | Admitting: Internal Medicine

## 2022-07-30 DIAGNOSIS — Z1159 Encounter for screening for other viral diseases: Secondary | ICD-10-CM

## 2022-07-30 DIAGNOSIS — F1410 Cocaine abuse, uncomplicated: Secondary | ICD-10-CM | POA: Diagnosis present

## 2022-07-30 DIAGNOSIS — F1510 Other stimulant abuse, uncomplicated: Secondary | ICD-10-CM | POA: Diagnosis present

## 2022-07-30 DIAGNOSIS — Z9889 Other specified postprocedural states: Secondary | ICD-10-CM

## 2022-07-30 DIAGNOSIS — F319 Bipolar disorder, unspecified: Secondary | ICD-10-CM | POA: Diagnosis present

## 2022-07-30 DIAGNOSIS — F1110 Opioid abuse, uncomplicated: Secondary | ICD-10-CM | POA: Diagnosis present

## 2022-07-30 DIAGNOSIS — B9689 Other specified bacterial agents as the cause of diseases classified elsewhere: Secondary | ICD-10-CM | POA: Diagnosis present

## 2022-07-30 DIAGNOSIS — F17210 Nicotine dependence, cigarettes, uncomplicated: Secondary | ICD-10-CM | POA: Diagnosis present

## 2022-07-30 DIAGNOSIS — M8668 Other chronic osteomyelitis, other site: Secondary | ICD-10-CM | POA: Diagnosis present

## 2022-07-30 DIAGNOSIS — J069 Acute upper respiratory infection, unspecified: Secondary | ICD-10-CM | POA: Diagnosis present

## 2022-07-30 DIAGNOSIS — F988 Other specified behavioral and emotional disorders with onset usually occurring in childhood and adolescence: Secondary | ICD-10-CM

## 2022-07-30 DIAGNOSIS — F1990 Other psychoactive substance use, unspecified, uncomplicated: Secondary | ICD-10-CM

## 2022-07-30 DIAGNOSIS — F909 Attention-deficit hyperactivity disorder, unspecified type: Secondary | ICD-10-CM | POA: Diagnosis present

## 2022-07-30 DIAGNOSIS — D75839 Thrombocytosis, unspecified: Secondary | ICD-10-CM | POA: Diagnosis present

## 2022-07-30 DIAGNOSIS — G060 Intracranial abscess and granuloma: Secondary | ICD-10-CM | POA: Diagnosis not present

## 2022-07-30 DIAGNOSIS — Z79899 Other long term (current) drug therapy: Secondary | ICD-10-CM

## 2022-07-30 DIAGNOSIS — R112 Nausea with vomiting, unspecified: Secondary | ICD-10-CM | POA: Insufficient documentation

## 2022-07-30 DIAGNOSIS — F1210 Cannabis abuse, uncomplicated: Secondary | ICD-10-CM | POA: Diagnosis present

## 2022-07-30 DIAGNOSIS — I472 Ventricular tachycardia, unspecified: Secondary | ICD-10-CM | POA: Diagnosis present

## 2022-07-30 DIAGNOSIS — M869 Osteomyelitis, unspecified: Secondary | ICD-10-CM

## 2022-07-30 DIAGNOSIS — G4700 Insomnia, unspecified: Secondary | ICD-10-CM | POA: Diagnosis present

## 2022-07-30 DIAGNOSIS — Z23 Encounter for immunization: Secondary | ICD-10-CM

## 2022-07-30 DIAGNOSIS — Z888 Allergy status to other drugs, medicaments and biological substances status: Secondary | ICD-10-CM

## 2022-07-30 DIAGNOSIS — F1910 Other psychoactive substance abuse, uncomplicated: Principal | ICD-10-CM

## 2022-07-30 DIAGNOSIS — Z1152 Encounter for screening for COVID-19: Secondary | ICD-10-CM

## 2022-07-30 DIAGNOSIS — Z6841 Body Mass Index (BMI) 40.0 and over, adult: Secondary | ICD-10-CM

## 2022-07-30 DIAGNOSIS — H0520 Unspecified exophthalmos: Secondary | ICD-10-CM | POA: Diagnosis present

## 2022-07-30 DIAGNOSIS — F419 Anxiety disorder, unspecified: Secondary | ICD-10-CM | POA: Diagnosis present

## 2022-07-30 LAB — COMPREHENSIVE METABOLIC PANEL
ALT: 22 U/L (ref 0–44)
AST: 18 U/L (ref 15–41)
Albumin: 3.8 g/dL (ref 3.5–5.0)
Alkaline Phosphatase: 64 U/L (ref 38–126)
Anion gap: 11 (ref 5–15)
BUN: 6 mg/dL (ref 6–20)
CO2: 26 mmol/L (ref 22–32)
Calcium: 9.5 mg/dL (ref 8.9–10.3)
Chloride: 101 mmol/L (ref 98–111)
Creatinine, Ser: 0.83 mg/dL (ref 0.61–1.24)
GFR, Estimated: >60 mL/min (ref >60)
Glucose, Bld: 111 mg/dL — ABNORMAL HIGH (ref 70–99)
Potassium: 4.3 mmol/L (ref 3.5–5.1)
Sodium: 138 mmol/L (ref 135–145)
Total Bilirubin: 0.6 mg/dL (ref 0.3–1.2)
Total Protein: 8 g/dL (ref 6.5–8.1)

## 2022-07-30 LAB — RAPID URINE DRUG SCREEN, HOSP PERFORMED
Amphetamines: POSITIVE — AB
Barbiturates: NOT DETECTED
Benzodiazepines: NOT DETECTED
Cocaine: POSITIVE — AB
Opiates: POSITIVE — AB
Tetrahydrocannabinol: POSITIVE — AB

## 2022-07-30 LAB — URINALYSIS, ROUTINE W REFLEX MICROSCOPIC
Bilirubin Urine: NEGATIVE
Glucose, UA: NEGATIVE mg/dL
Hgb urine dipstick: NEGATIVE
Ketones, ur: 5 mg/dL — AB
Leukocytes,Ua: NEGATIVE
Nitrite: NEGATIVE
Protein, ur: 100 mg/dL — AB
Specific Gravity, Urine: 1.021 (ref 1.005–1.030)
pH: 7 (ref 5.0–8.0)

## 2022-07-30 LAB — LIPASE, BLOOD: Lipase: 26 U/L (ref 11–51)

## 2022-07-30 LAB — RESP PANEL BY RT-PCR (FLU A&B, COVID) ARPGX2
Influenza A by PCR: NEGATIVE
Influenza B by PCR: NEGATIVE
SARS Coronavirus 2 by RT PCR: NEGATIVE

## 2022-07-30 LAB — CBG MONITORING, ED: Glucose-Capillary: 119 mg/dL — ABNORMAL HIGH (ref 70–99)

## 2022-07-30 MED ORDER — ONDANSETRON 4 MG PO TBDP
4.0000 mg | ORAL_TABLET | Freq: Once | ORAL | Status: AC
  Administered 2022-07-30: 4 mg via ORAL
  Filled 2022-07-30: qty 1

## 2022-07-30 NOTE — ED Provider Notes (Signed)
  11:59 PM Call from radiology regarding head CT-- seems to have break in calvarium on right.  Questionable abscess vs metastatic disease.  Recommended MRI brain with and without contrast for further evaluation which has been ordered.    Labs sent previously, no CBC ordered.  This has been added.  Acuity upgraded to level 2 to expedite room assignment.   Garlon Hatchet, PA-C 07/31/22 0001    Mesner, Barbara Cower, MD 07/31/22 [phone]
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
## 2022-07-31 ENCOUNTER — Emergency Department (HOSPITAL_COMMUNITY): Payer: BLUE CROSS/BLUE SHIELD

## 2022-07-31 ENCOUNTER — Other Ambulatory Visit: Payer: Self-pay

## 2022-07-31 ENCOUNTER — Inpatient Hospital Stay (HOSPITAL_COMMUNITY): Payer: BLUE CROSS/BLUE SHIELD | Admitting: Certified Registered Nurse Anesthetist

## 2022-07-31 ENCOUNTER — Other Ambulatory Visit: Payer: Self-pay | Admitting: Neurosurgery

## 2022-07-31 ENCOUNTER — Encounter (HOSPITAL_COMMUNITY): Payer: Self-pay | Admitting: Internal Medicine

## 2022-07-31 ENCOUNTER — Encounter (HOSPITAL_COMMUNITY)
Admission: EM | Disposition: A | Discharge status: Home / Self Care | Payer: Self-pay | Source: Home / Self Care | Attending: Internal Medicine

## 2022-07-31 DIAGNOSIS — F17210 Nicotine dependence, cigarettes, uncomplicated: Secondary | ICD-10-CM | POA: Diagnosis present

## 2022-07-31 DIAGNOSIS — D75839 Thrombocytosis, unspecified: Secondary | ICD-10-CM | POA: Insufficient documentation

## 2022-07-31 DIAGNOSIS — F1910 Other psychoactive substance abuse, uncomplicated: Secondary | ICD-10-CM | POA: Diagnosis not present

## 2022-07-31 DIAGNOSIS — F1410 Cocaine abuse, uncomplicated: Secondary | ICD-10-CM | POA: Diagnosis present

## 2022-07-31 DIAGNOSIS — Z9889 Other specified postprocedural states: Secondary | ICD-10-CM

## 2022-07-31 DIAGNOSIS — Z1152 Encounter for screening for COVID-19: Secondary | ICD-10-CM | POA: Diagnosis not present

## 2022-07-31 DIAGNOSIS — G060 Intracranial abscess and granuloma: Secondary | ICD-10-CM | POA: Diagnosis present

## 2022-07-31 DIAGNOSIS — F908 Attention-deficit hyperactivity disorder, other type: Secondary | ICD-10-CM | POA: Diagnosis not present

## 2022-07-31 DIAGNOSIS — H0520 Unspecified exophthalmos: Secondary | ICD-10-CM | POA: Insufficient documentation

## 2022-07-31 DIAGNOSIS — G4700 Insomnia, unspecified: Secondary | ICD-10-CM | POA: Diagnosis present

## 2022-07-31 DIAGNOSIS — B9689 Other specified bacterial agents as the cause of diseases classified elsewhere: Secondary | ICD-10-CM | POA: Diagnosis present

## 2022-07-31 DIAGNOSIS — F1510 Other stimulant abuse, uncomplicated: Secondary | ICD-10-CM | POA: Diagnosis present

## 2022-07-31 DIAGNOSIS — F1110 Opioid abuse, uncomplicated: Secondary | ICD-10-CM | POA: Diagnosis present

## 2022-07-31 DIAGNOSIS — F319 Bipolar disorder, unspecified: Secondary | ICD-10-CM | POA: Diagnosis present

## 2022-07-31 DIAGNOSIS — Z79899 Other long term (current) drug therapy: Secondary | ICD-10-CM | POA: Diagnosis not present

## 2022-07-31 DIAGNOSIS — F1990 Other psychoactive substance use, unspecified, uncomplicated: Secondary | ICD-10-CM | POA: Diagnosis not present

## 2022-07-31 DIAGNOSIS — Z1159 Encounter for screening for other viral diseases: Secondary | ICD-10-CM | POA: Diagnosis not present

## 2022-07-31 DIAGNOSIS — F1210 Cannabis abuse, uncomplicated: Secondary | ICD-10-CM | POA: Diagnosis present

## 2022-07-31 DIAGNOSIS — M8668 Other chronic osteomyelitis, other site: Secondary | ICD-10-CM | POA: Diagnosis present

## 2022-07-31 DIAGNOSIS — I472 Ventricular tachycardia, unspecified: Secondary | ICD-10-CM | POA: Diagnosis present

## 2022-07-31 DIAGNOSIS — F419 Anxiety disorder, unspecified: Secondary | ICD-10-CM | POA: Diagnosis present

## 2022-07-31 DIAGNOSIS — F909 Attention-deficit hyperactivity disorder, unspecified type: Secondary | ICD-10-CM | POA: Diagnosis present

## 2022-07-31 DIAGNOSIS — R112 Nausea with vomiting, unspecified: Secondary | ICD-10-CM

## 2022-07-31 DIAGNOSIS — Z23 Encounter for immunization: Secondary | ICD-10-CM | POA: Diagnosis not present

## 2022-07-31 DIAGNOSIS — M8618 Other acute osteomyelitis, other site: Secondary | ICD-10-CM | POA: Diagnosis not present

## 2022-07-31 DIAGNOSIS — Z888 Allergy status to other drugs, medicaments and biological substances status: Secondary | ICD-10-CM | POA: Diagnosis not present

## 2022-07-31 DIAGNOSIS — J069 Acute upper respiratory infection, unspecified: Secondary | ICD-10-CM | POA: Diagnosis present

## 2022-07-31 DIAGNOSIS — Z6841 Body Mass Index (BMI) 40.0 and over, adult: Secondary | ICD-10-CM | POA: Diagnosis not present

## 2022-07-31 HISTORY — PX: LUMBAR WOUND DEBRIDEMENT: SHX1988

## 2022-07-31 HISTORY — DX: Other specified postprocedural states: Z98.890

## 2022-07-31 HISTORY — DX: Nausea with vomiting, unspecified: R11.2

## 2022-07-31 LAB — CBC WITH DIFFERENTIAL/PLATELET
Abs Immature Granulocytes: 0.07 10*3/uL (ref 0.00–0.07)
Basophils Absolute: 0.1 10*3/uL (ref 0.0–0.1)
Basophils Relative: 1 %
Eosinophils Absolute: 0.5 10*3/uL (ref 0.0–0.5)
Eosinophils Relative: 4 %
HCT: 49.1 % (ref 39.0–52.0)
Hemoglobin: 15.4 g/dL (ref 13.0–17.0)
Immature Granulocytes: 1 %
Lymphocytes Relative: 24 %
Lymphs Abs: 2.8 10*3/uL (ref 0.7–4.0)
MCH: 26.4 pg (ref 26.0–34.0)
MCHC: 31.4 g/dL (ref 30.0–36.0)
MCV: 84.1 fL (ref 80.0–100.0)
Monocytes Absolute: 0.5 10*3/uL (ref 0.1–1.0)
Monocytes Relative: 4 %
Neutro Abs: 7.8 10*3/uL — ABNORMAL HIGH (ref 1.7–7.7)
Neutrophils Relative %: 66 %
Platelets: 617 10*3/uL — ABNORMAL HIGH (ref 150–400)
RBC: 5.84 MIL/uL — ABNORMAL HIGH (ref 4.22–5.81)
RDW: 14.5 % (ref 11.5–15.5)
WBC: 11.8 10*3/uL — ABNORMAL HIGH (ref 4.0–10.5)
nRBC: 0 % (ref 0.0–0.2)

## 2022-07-31 LAB — CBC
HCT: 46.6 % (ref 39.0–52.0)
Hemoglobin: 14.5 g/dL (ref 13.0–17.0)
MCH: 26.7 pg (ref 26.0–34.0)
MCHC: 31.1 g/dL (ref 30.0–36.0)
MCV: 85.7 fL (ref 80.0–100.0)
Platelets: 555 10*3/uL — ABNORMAL HIGH (ref 150–400)
RBC: 5.44 MIL/uL (ref 4.22–5.81)
RDW: 14.6 % (ref 11.5–15.5)
WBC: 13.5 10*3/uL — ABNORMAL HIGH (ref 4.0–10.5)
nRBC: 0 % (ref 0.0–0.2)

## 2022-07-31 LAB — TSH: TSH: 2.682 u[IU]/mL (ref 0.350–4.500)

## 2022-07-31 LAB — HIV ANTIBODY (ROUTINE TESTING W REFLEX): HIV Screen 4th Generation wRfx: NONREACTIVE

## 2022-07-31 SURGERY — LUMBAR WOUND DEBRIDEMENT
Anesthesia: General | Site: Head | Laterality: Right

## 2022-07-31 MED ORDER — VANCOMYCIN HCL IN DEXTROSE 1-5 GM/200ML-% IV SOLN: 1000.0000 mg | Freq: Once | INTRAVENOUS | Status: DC

## 2022-07-31 MED ORDER — SODIUM CHLORIDE 0.9 % IV SOLN: INTRAVENOUS | Status: DC

## 2022-07-31 MED ORDER — THROMBIN 20000 UNITS EX SOLR
CUTANEOUS | Status: AC
  Filled 2022-07-31: qty 20000

## 2022-07-31 MED ORDER — MIDAZOLAM HCL 5 MG/5ML IJ SOLN
INTRAMUSCULAR | Status: DC | PRN
  Administered 2022-07-31: 2 mg via INTRAVENOUS

## 2022-07-31 MED ORDER — HYDROMORPHONE HCL 1 MG/ML IJ SOLN
INTRAMUSCULAR | Status: AC
  Filled 2022-07-31: qty 1

## 2022-07-31 MED ORDER — POTASSIUM CHLORIDE IN NACL 20-0.9 MEQ/L-% IV SOLN
INTRAVENOUS | Status: DC
  Filled 2022-07-31 (×7): qty 1000

## 2022-07-31 MED ORDER — PHENYLEPHRINE 80 MCG/ML (10ML) SYRINGE FOR IV PUSH (FOR BLOOD PRESSURE SUPPORT)
PREFILLED_SYRINGE | INTRAVENOUS | Status: DC | PRN
  Administered 2022-07-31: 160 ug via INTRAVENOUS

## 2022-07-31 MED ORDER — TETANUS-DIPHTH-ACELL PERTUSSIS 5-2.5-18.5 LF-MCG/0.5 IM SUSY
0.5000 mL | PREFILLED_SYRINGE | Freq: Once | INTRAMUSCULAR | Status: AC
  Administered 2022-07-31: 0.5 mL via INTRAMUSCULAR
  Filled 2022-07-31: qty 0.5

## 2022-07-31 MED ORDER — ONDANSETRON HCL 4 MG PO TABS
4.0000 mg | ORAL_TABLET | ORAL | Status: DC | PRN
  Administered 2022-08-04: 4 mg via ORAL
  Filled 2022-07-31: qty 1

## 2022-07-31 MED ORDER — ACETAMINOPHEN 325 MG PO TABS
650.0000 mg | ORAL_TABLET | Freq: Four times a day (QID) | ORAL | Status: DC | PRN
  Administered 2022-08-01 – 2022-08-15 (×3): 650 mg via ORAL
  Filled 2022-07-31 (×3): qty 2

## 2022-07-31 MED ORDER — LIDOCAINE 2% (20 MG/ML) 5 ML SYRINGE
INTRAMUSCULAR | Status: DC | PRN
  Administered 2022-07-31: 40 mg via INTRAVENOUS

## 2022-07-31 MED ORDER — ORAL CARE MOUTH RINSE: 15.0000 mL | Freq: Once | OROMUCOSAL | Status: AC

## 2022-07-31 MED ORDER — SUGAMMADEX SODIUM 200 MG/2ML IV SOLN
INTRAVENOUS | Status: DC | PRN
  Administered 2022-07-31: 250 mg via INTRAVENOUS

## 2022-07-31 MED ORDER — SODIUM CHLORIDE 0.9 % IV SOLN
2.0000 g | INTRAVENOUS | Status: AC
  Administered 2022-07-31: 2 g via INTRAVENOUS
  Filled 2022-07-31: qty 12.5

## 2022-07-31 MED ORDER — HYDROCODONE-ACETAMINOPHEN 5-325 MG PO TABS
1.0000 | ORAL_TABLET | ORAL | Status: DC | PRN
  Administered 2022-08-01 – 2022-08-02 (×6): 1 via ORAL
  Filled 2022-07-31 (×7): qty 1

## 2022-07-31 MED ORDER — FENTANYL CITRATE (PF) 250 MCG/5ML IJ SOLN
INTRAMUSCULAR | Status: AC
  Filled 2022-07-31: qty 5

## 2022-07-31 MED ORDER — THROMBIN 20000 UNITS EX SOLR
CUTANEOUS | Status: DC | PRN
  Administered 2022-07-31: 20 mL

## 2022-07-31 MED ORDER — HALOPERIDOL LACTATE 5 MG/ML IJ SOLN
2.0000 mg | Freq: Once | INTRAMUSCULAR | Status: AC
  Administered 2022-07-31: 2 mg via INTRAVENOUS
  Filled 2022-07-31: qty 1

## 2022-07-31 MED ORDER — DEXMEDETOMIDINE HCL IN NACL 80 MCG/20ML IV SOLN
INTRAVENOUS | Status: DC | PRN
  Administered 2022-07-31: 12 ug via BUCCAL
  Administered 2022-07-31: 16 ug via BUCCAL

## 2022-07-31 MED ORDER — PROMETHAZINE HCL 12.5 MG PO TABS
12.5000 mg | ORAL_TABLET | ORAL | Status: DC | PRN
  Administered 2022-08-03: 12.5 mg via ORAL
  Filled 2022-07-31 (×2): qty 2

## 2022-07-31 MED ORDER — PROPOFOL 10 MG/ML IV BOLUS
INTRAVENOUS | Status: AC
  Filled 2022-07-31: qty 20

## 2022-07-31 MED ORDER — DEXAMETHASONE SODIUM PHOSPHATE 10 MG/ML IJ SOLN
INTRAMUSCULAR | Status: DC | PRN
  Administered 2022-07-31: 10 mg via INTRAVENOUS

## 2022-07-31 MED ORDER — LACTATED RINGERS IV SOLN: INTRAVENOUS | Status: DC | PRN

## 2022-07-31 MED ORDER — CHLORHEXIDINE GLUCONATE 0.12 % MT SOLN
15.0000 mL | Freq: Once | OROMUCOSAL | Status: AC
  Administered 2022-07-31: 15 mL via OROMUCOSAL
  Filled 2022-07-31 (×2): qty 15

## 2022-07-31 MED ORDER — ACETAMINOPHEN 650 MG RE SUPP: 650.0000 mg | Freq: Four times a day (QID) | RECTAL | Status: DC | PRN

## 2022-07-31 MED ORDER — VANCOMYCIN HCL 2000 MG/400ML IV SOLN
2000.0000 mg | INTRAVENOUS | Status: AC
  Administered 2022-07-31: 2000 mg via INTRAVENOUS
  Filled 2022-07-31: qty 400

## 2022-07-31 MED ORDER — MAGNESIUM SULFATE 2 GM/50ML IV SOLN: 2.0000 g | Freq: Once | INTRAVENOUS | Status: DC

## 2022-07-31 MED ORDER — MIDAZOLAM HCL 2 MG/2ML IJ SOLN
INTRAMUSCULAR | Status: AC
  Filled 2022-07-31: qty 2

## 2022-07-31 MED ORDER — PROPOFOL 10 MG/ML IV BOLUS
INTRAVENOUS | Status: DC | PRN
  Administered 2022-07-31: 120 mg via INTRAVENOUS

## 2022-07-31 MED ORDER — METRONIDAZOLE 500 MG/100ML IV SOLN
500.0000 mg | Freq: Four times a day (QID) | INTRAVENOUS | Status: DC
  Administered 2022-07-31 – 2022-08-07 (×28): 500 mg via INTRAVENOUS
  Filled 2022-07-31 (×28): qty 100

## 2022-07-31 MED ORDER — LIDOCAINE-EPINEPHRINE 0.5 %-1:200000 IJ SOLN
INTRAMUSCULAR | Status: DC | PRN
  Administered 2022-07-31: 7 mL via INTRADERMAL

## 2022-07-31 MED ORDER — BACITRACIN ZINC 500 UNIT/GM EX OINT
TOPICAL_OINTMENT | CUTANEOUS | Status: DC | PRN
  Administered 2022-07-31: 1 via TOPICAL

## 2022-07-31 MED ORDER — 0.9 % SODIUM CHLORIDE (POUR BTL) OPTIME
TOPICAL | Status: DC | PRN
  Administered 2022-07-31: 1000 mL

## 2022-07-31 MED ORDER — HYDROMORPHONE HCL 1 MG/ML IJ SOLN
0.2500 mg | INTRAMUSCULAR | Status: DC | PRN
  Administered 2022-07-31 (×3): 0.5 mg via INTRAVENOUS

## 2022-07-31 MED ORDER — LIDOCAINE-EPINEPHRINE 0.5 %-1:200000 IJ SOLN
INTRAMUSCULAR | Status: AC
  Filled 2022-07-31: qty 1

## 2022-07-31 MED ORDER — NALOXONE HCL 0.4 MG/ML IJ SOLN: 0.0800 mg | INTRAMUSCULAR | Status: DC | PRN

## 2022-07-31 MED ORDER — ONDANSETRON HCL 4 MG/2ML IJ SOLN
4.0000 mg | INTRAMUSCULAR | Status: DC | PRN
  Administered 2022-08-01 – 2022-08-03 (×2): 4 mg via INTRAVENOUS
  Filled 2022-07-31 (×2): qty 2

## 2022-07-31 MED ORDER — ONDANSETRON HCL 4 MG/2ML IJ SOLN: 4.0000 mg | Freq: Four times a day (QID) | INTRAMUSCULAR | Status: DC | PRN

## 2022-07-31 MED ORDER — LABETALOL HCL 5 MG/ML IV SOLN: 10.0000 mg | INTRAVENOUS | Status: DC | PRN

## 2022-07-31 MED ORDER — BACITRACIN ZINC 500 UNIT/GM EX OINT
TOPICAL_OINTMENT | CUTANEOUS | Status: AC
  Filled 2022-07-31: qty 28.35

## 2022-07-31 MED ORDER — MORPHINE SULFATE (PF) 2 MG/ML IV SOLN
1.0000 mg | INTRAVENOUS | Status: DC | PRN
  Administered 2022-08-01 (×3): 2 mg via INTRAVENOUS
  Filled 2022-07-31 (×2): qty 1

## 2022-07-31 MED ORDER — FENTANYL CITRATE (PF) 250 MCG/5ML IJ SOLN
INTRAMUSCULAR | Status: DC | PRN
  Administered 2022-07-31: 50 ug via INTRAVENOUS
  Administered 2022-07-31: 150 ug via INTRAVENOUS
  Administered 2022-07-31: 50 ug via INTRAVENOUS

## 2022-07-31 MED ORDER — ONDANSETRON HCL 4 MG/2ML IJ SOLN
INTRAMUSCULAR | Status: DC | PRN
  Administered 2022-07-31: 4 mg via INTRAVENOUS

## 2022-07-31 MED ORDER — BUPIVACAINE HCL (PF) 0.5 % IJ SOLN
INTRAMUSCULAR | Status: AC
  Filled 2022-07-31: qty 30

## 2022-07-31 MED ORDER — VANCOMYCIN HCL 1250 MG/250ML IV SOLN
1250.0000 mg | Freq: Three times a day (TID) | INTRAVENOUS | Status: DC
  Administered 2022-07-31 – 2022-08-02 (×5): 1250 mg via INTRAVENOUS
  Filled 2022-07-31 (×10): qty 250

## 2022-07-31 MED ORDER — GADOBUTROL 1 MMOL/ML IV SOLN
10.0000 mL | Freq: Once | INTRAVENOUS | Status: AC | PRN
  Administered 2022-07-31: 10 mL via INTRAVENOUS

## 2022-07-31 MED ORDER — ROCURONIUM BROMIDE 10 MG/ML (PF) SYRINGE
PREFILLED_SYRINGE | INTRAVENOUS | Status: DC | PRN
  Administered 2022-07-31 (×2): 50 mg via INTRAVENOUS

## 2022-07-31 MED ORDER — SODIUM CHLORIDE 0.9 % IV SOLN
2.0000 g | Freq: Three times a day (TID) | INTRAVENOUS | Status: DC
  Administered 2022-07-31 – 2022-08-07 (×20): 2 g via INTRAVENOUS
  Filled 2022-07-31 (×20): qty 12.5

## 2022-07-31 SURGICAL SUPPLY — 90 items
ALLOGRAFT SALERA POWDER 40 (Graft) IMPLANT
APL SKNCLS STERI-STRIP NONHPOA (GAUZE/BANDAGES/DRESSINGS)
BAG COUNTER SPONGE SURGICOUNT (BAG) ×2 IMPLANT
BAG SPNG CNTER NS LX DISP (BAG) ×2
BENZOIN TINCTURE PRP APPL 2/3 (GAUZE/BANDAGES/DRESSINGS) IMPLANT
BLADE CLIPPER SURG (BLADE) ×1 IMPLANT
BNDG GAUZE DERMACEA FLUFF 4 (GAUZE/BANDAGES/DRESSINGS) IMPLANT
BNDG GZE DERMACEA 4 6PLY (GAUZE/BANDAGES/DRESSINGS)
BUR ACORN 6.0 PRECISION (BURR) IMPLANT
BUR MATCHSTICK NEURO 3.0 LAGG (BURR) IMPLANT
BUR SPIRAL ROUTER 2.3 (BUR) IMPLANT
CANISTER SUCT 3000ML PPV (MISCELLANEOUS) ×2 IMPLANT
CLIP VESOCCLUDE MED 6/CT (CLIP) IMPLANT
DRAPE LAPAROTOMY 100X72X124 (DRAPES) ×1 IMPLANT
DRAPE NEUROLOGICAL W/INCISE (DRAPES) ×1 IMPLANT
DRAPE SURG 17X23 STRL (DRAPES) ×1 IMPLANT
DRAPE WARM FLUID 44X44 (DRAPES) ×1 IMPLANT
DRSG TELFA 3X8 NADH STRL (GAUZE/BANDAGES/DRESSINGS) IMPLANT
DURAPREP 26ML APPLICATOR (WOUND CARE) ×1 IMPLANT
DURAPREP 6ML APPLICATOR 50/CS (WOUND CARE) ×1 IMPLANT
ELECT REM PT RETURN 9FT ADLT (ELECTROSURGICAL) ×2
ELECTRODE REM PT RTRN 9FT ADLT (ELECTROSURGICAL) ×2 IMPLANT
EVACUATOR 1/8 PVC DRAIN (DRAIN) IMPLANT
EVACUATOR SILICONE 100CC (DRAIN) IMPLANT
GAUZE 4X4 16PLY ~~LOC~~+RFID DBL (SPONGE) IMPLANT
GAUZE SPONGE 4X4 12PLY STRL (GAUZE/BANDAGES/DRESSINGS) ×1 IMPLANT
GLOVE BIO SURGEON STRL SZ 6.5 (GLOVE) IMPLANT
GLOVE BIO SURGEON STRL SZ7 (GLOVE) IMPLANT
GLOVE BIO SURGEON STRL SZ7.5 (GLOVE) IMPLANT
GLOVE BIO SURGEON STRL SZ8 (GLOVE) IMPLANT
GLOVE BIO SURGEON STRL SZ8.5 (GLOVE) IMPLANT
GLOVE BIOGEL M 8.0 STRL (GLOVE) IMPLANT
GLOVE ECLIPSE 6.5 STRL STRAW (GLOVE) ×2 IMPLANT
GLOVE ECLIPSE 7.0 STRL STRAW (GLOVE) IMPLANT
GLOVE ECLIPSE 7.5 STRL STRAW (GLOVE) IMPLANT
GLOVE ECLIPSE 8.0 STRL XLNG CF (GLOVE) IMPLANT
GLOVE ECLIPSE 8.5 STRL (GLOVE) IMPLANT
GLOVE EXAM NITRILE XL STR (GLOVE) IMPLANT
GLOVE INDICATOR 6.5 STRL GRN (GLOVE) IMPLANT
GLOVE INDICATOR 7.0 STRL GRN (GLOVE) IMPLANT
GLOVE INDICATOR 7.5 STRL GRN (GLOVE) IMPLANT
GLOVE INDICATOR 8.0 STRL GRN (GLOVE) IMPLANT
GLOVE INDICATOR 8.5 STRL (GLOVE) IMPLANT
GLOVE OPTIFIT SS 8.0 STRL (GLOVE) IMPLANT
GLOVE SURG SS PI 6.5 STRL IVOR (GLOVE) IMPLANT
GOWN STRL REUS W/ TWL LRG LVL3 (GOWN DISPOSABLE) ×4 IMPLANT
GOWN STRL REUS W/ TWL XL LVL3 (GOWN DISPOSABLE) IMPLANT
GOWN STRL REUS W/TWL 2XL LVL3 (GOWN DISPOSABLE) IMPLANT
GOWN STRL REUS W/TWL LRG LVL3 (GOWN DISPOSABLE) ×4
GOWN STRL REUS W/TWL XL LVL3 (GOWN DISPOSABLE)
HEMOSTAT SURGICEL 2X14 (HEMOSTASIS) IMPLANT
KIT BASIN OR (CUSTOM PROCEDURE TRAY) ×2 IMPLANT
KIT TURNOVER KIT B (KITS) ×2 IMPLANT
NDL HYPO 25X1 1.5 SAFETY (NEEDLE) ×1 IMPLANT
NEEDLE HYPO 25X1 1.5 SAFETY (NEEDLE) ×1 IMPLANT
NS IRRIG 1000ML POUR BTL (IV SOLUTION) ×2 IMPLANT
PACK BATTERY CMF DISP FOR DVR (ORTHOPEDIC DISPOSABLE SUPPLIES) IMPLANT
PACK CRANIOTOMY CUSTOM (CUSTOM PROCEDURE TRAY) ×1 IMPLANT
PACK LAMINECTOMY NEURO (CUSTOM PROCEDURE TRAY) ×1 IMPLANT
PAD ARMBOARD 7.5X6 YLW CONV (MISCELLANEOUS) ×3 IMPLANT
PATTIES SURGICAL .5 X.5 (GAUZE/BANDAGES/DRESSINGS) IMPLANT
PATTIES SURGICAL .5 X3 (DISPOSABLE) IMPLANT
PATTIES SURGICAL 1X1 (DISPOSABLE) IMPLANT
PLATE CRANIAL 20 W/TAB F/SHN (Plate) IMPLANT
SCREW UNIII AXS SD 1.5X4 (Screw) IMPLANT
SPIKE FLUID TRANSFER (MISCELLANEOUS) ×1 IMPLANT
SPONGE NEURO XRAY DETECT 1X3 (DISPOSABLE) IMPLANT
SPONGE SURGIFOAM ABS GEL 100 (HEMOSTASIS) ×1 IMPLANT
SPONGE SURGIFOAM ABS GEL SZ50 (HEMOSTASIS) ×1 IMPLANT
SPONGE T-LAP 4X18 ~~LOC~~+RFID (SPONGE) IMPLANT
STAPLER VISISTAT 35W (STAPLE) ×1 IMPLANT
STRIP CLOSURE SKIN 1/2X4 (GAUZE/BANDAGES/DRESSINGS) IMPLANT
SUT ETHILON 3 0 FSL (SUTURE) IMPLANT
SUT ETHILON 3 0 PS 1 (SUTURE) IMPLANT
SUT NURALON 4 0 TR CR/8 (SUTURE) ×3 IMPLANT
SUT STEEL 0 (SUTURE)
SUT STEEL 0 18XMFL TIE 17 (SUTURE) IMPLANT
SUT VIC AB 0 CT1 18XCR BRD8 (SUTURE) ×1 IMPLANT
SUT VIC AB 0 CT1 8-18 (SUTURE)
SUT VIC AB 2-0 CT1 18 (SUTURE) ×1 IMPLANT
SUT VIC AB 2-0 CT2 18 VCP726D (SUTURE) ×2 IMPLANT
SUT VIC AB 3-0 SH 8-18 (SUTURE) ×1 IMPLANT
SWAB COLLECTION DEVICE MRSA (MISCELLANEOUS) IMPLANT
SWAB CULTURE ESWAB REG 1ML (MISCELLANEOUS) IMPLANT
TOWEL GREEN STERILE (TOWEL DISPOSABLE) ×2 IMPLANT
TOWEL GREEN STERILE FF (TOWEL DISPOSABLE) ×2 IMPLANT
TRAY FOLEY MTR SLVR 16FR STAT (SET/KITS/TRAYS/PACK) ×1 IMPLANT
TUBE CONNECTING 12X1/4 (SUCTIONS) ×1 IMPLANT
UNDERPAD 30X36 HEAVY ABSORB (UNDERPADS AND DIAPERS) ×1 IMPLANT
WATER STERILE IRR 1000ML POUR (IV SOLUTION) ×2 IMPLANT
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
## 2022-07-31 NOTE — Progress Notes (Signed)
No charge note  Patient seen and examined this morning, admitted overnight, H&P reviewed and agree with the assessment and plan.  This is a 26 year old male without significant past medical history who comes into the hospital with right-sided headache and vomiting.  Imaging in the emergency room shows soft tissue mass, skull defect with some erosion and dural enhancement, concerning for abscess.  Patient's urine drug screen was positive for opioids, cocaine, amphetamines, THC.  He admits to shooting cocaine.  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  Keep n.p.o. as recommended for potential surgery.  Has been darted on antibiotics, cultures were sent.  Consult ID today  Active problems Headache, nausea, vomiting-all resolved now  Polysubstance abuse-again, he does not want his family or parents to know  Chronic thrombocytosis-monitor  Scheduled Meds: Continuous Infusions:  ceFEPime (MAXIPIME) IV     metronidazole 500 mg (07/31/22 5361)   vancomycin     PRN Meds:.acetaminophen **OR** acetaminophen  Devin Labell M. Elvera Lennox, MD, PhD [facility]  Between 7 am - 7 pm you can contact me via Amion (for emergencies) or Securechat (non urgent matters).  I am not available 7 pm - 7 am, please contact night coverage MD/APP via Amion

## 2022-07-31 NOTE — ED Notes (Signed)
Reached out to MD for family questions. Surgery MD not available.
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
## 2022-07-31 NOTE — Progress Notes (Signed)
Pharmacy Antibiotic Note  Devin Barker is a 26 y.o. male admitted on 07/30/2022 with temporalis infection involving skull and dura.  Pharmacy has been consulted for vancomycin and cefepime dosing.  Pt also started on metronidazole.  Plan: Vancomycin 2000mg  IV x1 then 1250mg  IV every 8 hours.  Goal trough 15-20 mcg/mL. Cefepime 2g IV every 8 hours.  Height: 5\' 7"  (170.2 cm) Weight: 122.5 kg (270 lb) IBW/kg (Calculated) : 66.1  Temp (24hrs), Avg:97.9 F (36.6 C), Min:97.8 F (36.6 C), Max:98 F (36.7 C)  Recent Labs  Lab 07/30/22 2220 07/31/22 0020  WBC  --  11.8*  CREATININE 0.83  --     Estimated Creatinine Clearance: 170.7 mL/min (by C-G formula based on SCr of 0.83 mg/dL).    Allergies  Allergen Reactions   Other     Pt prefers medications that do not have gelatins. No capsules, gel caps etc....due to religious reasons  Pt also had a reaction to laughing gas (liquid form)     Thank you for allowing pharmacy to be a part of this patient's care.  , PharmD, BCPS  07/31/2022 3:50 AM

## 2022-07-31 NOTE — Anesthesia Postprocedure Evaluation (Signed)
Anesthesia Post Note  Patient: Katie Elobeid Eban  Procedure(s) Performed: Right Craniectomy for Mass Resection (Right: Head)     Patient location during evaluation: PACU Anesthesia Type: General Level of consciousness: awake and alert, patient cooperative and oriented Pain management: pain level controlled Vital Signs Assessment: post-procedure vital signs reviewed and stable Respiratory status: spontaneous breathing, nonlabored ventilation and respiratory function stable Cardiovascular status: blood pressure returned to baseline and stable Postop Assessment: no apparent nausea or vomiting Anesthetic complications: no   No notable events documented.  Last Vitals:  Vitals:   07/31/22 2200 07/31/22 2215  BP: 111/62 115/66  Pulse: 67 64  Resp: 14 15  Temp:    SpO2: 92% 94%    Last Pain:  Vitals:   07/31/22 2215  TempSrc:   PainSc: Asleep                 Ramzy Cappelletti,E. Trinda Harlacher

## 2022-07-31 NOTE — Consult Note (Signed)
[facility]    Date of Admission:  07/30/2022     Total days of antibiotics 2               Reason for Consult: Intracranial abcess   Referring Provider: Dr. Pamella Pert  Primary Care Provider: Patient, No Pcp Per   ASSESSMENT:  [name] is a 26 y/o male s/p trauma to his head about 3 months ago with metal rod presenting with increased nausea/vomiting and imaging showing a potential intracranial abscess. History of recent drug use as evidenced by toxicology screen which may also be a contributing factor. Awaiting skull debridement with Neurosurgery. Will continue with broad spectrum coverage with vancomycin, cefepime and metronidazole. Blood cultures are without growth to date and will continue to monitor for presence of bacteremia.  Monitor renal function and vancomycin levels for therapeutic drug monitoring. Remaining medical and supportive care per primary team.   PLAN:  Continue current dose of vancomycin, cefepime, and metronidazole.  Neurosurgery for skull debridement today.  Therapeutic drug monitoring of renal function and vancomycin levels.  Remaining medical and supportive care per primary team.    Principal Problem:   Intracranial abscess Active Problems:   Polysubstance abuse (HCC)   Thrombocytosis   Nausea and vomiting   Proptosis      HPI: Devin Barker is a 26 y.o. male with previous medical history of obesity presenting to the hospital with right sided headache and vomiting.   [name] has been having a right sided headache for approximately 3 months following a metal rod inadvertently falling on his head. Has been okay until recently when he developed vomiting which brought him to the hospital. Has recent polysubtance use (parents are not aware) and toxicology screen positive for amphetamines, opiates, cocaine and THC. Has been afebrile and recently seen by ophthalmology/optometry with changes  to his glasses prescription. MRI brain revealed an enhancing collection along the right frontal lobe extending through 18 mm calvarial defect along the right frontoparietal suture and parietal muscle. Neurosurgery concerned for probable temporalis infection involving the skull and the dura with plans for skull debridement. Blood cultures have been without growth to date and started on broad spectrum antibiotics with vancomycin, cefepime and metronidazole.  The bump located on the right side of his head has improved from what it was previously and feels better since arriving to the ED. Has not been back to [place] in the past 5 years.   Review of Systems: Review of Systems  Constitutional:  Negative for chills, fever and weight loss.  Respiratory:  Negative for cough, shortness of breath and wheezing.   Cardiovascular:  Negative for chest pain and leg swelling.  Gastrointestinal:  Negative for abdominal pain, constipation, diarrhea, nausea and vomiting.  Skin:  Negative for rash.  Neurological:  Positive for headaches.     Past Medical History:  Diagnosis Date   Knee dislocation    Obesity     Social History   Tobacco Use   Smoking status: Never   Smokeless tobacco: Never  Substance Use Topics   Alcohol use: No   Drug use: No    Family History  Problem Relation Age of Onset   Diabetes Mother    Hyperlipidemia Mother    Hypertension Father    Kidney disease Sister    Diabetes Maternal Grandmother    Diabetes Paternal Grandfather     Allergies  Allergen Reactions   Other     Pt  prefers medications that do not have gelatins. No capsules, gel caps etc....due to religious reasons  Pt also had a reaction to laughing gas (liquid form)     OBJECTIVE: Blood pressure (!) 125/90, pulse 66, temperature 98 F (36.7 C), resp. rate (!) 23, height 5\' 7"  (1.702 m), weight 122.5 kg, SpO2 99 %.  Physical Exam Constitutional:      General: He is not in acute distress.    Appearance: He  is well-developed.     Comments: Lying in bed with head of bed slightly elevated; pleasant.   HENT:     Head:     Comments: Lump located on right temporal region of head.  Cardiovascular:     Rate and Rhythm: Normal rate and regular rhythm.     Heart sounds: Normal heart sounds.  Pulmonary:     Effort: Pulmonary effort is normal.     Breath sounds: Normal breath sounds.  Skin:    General: Skin is warm and dry.  Neurological:     Mental Status: He is alert and oriented to person, place, and time.  Psychiatric:        Behavior: Behavior normal.        Thought Content: Thought content normal.        Judgment: Judgment normal.     Lab Results Lab Results  Component Value Date   WBC 13.5 (H) 07/31/2022   HGB 14.5 07/31/2022   HCT 46.6 07/31/2022   MCV 85.7 07/31/2022   PLT 555 (H) 07/31/2022    Lab Results  Component Value Date   CREATININE 0.83 07/30/2022   BUN 6 07/30/2022   NA 138 07/30/2022   K 4.3 07/30/2022   CL 101 07/30/2022   CO2 26 07/30/2022    Lab Results  Component Value Date   ALT 22 07/30/2022   AST 18 07/30/2022   ALKPHOS 64 07/30/2022   BILITOT 0.6 07/30/2022     Microbiology: Recent Results (from the past 240 hour(s))  Resp Panel by RT-PCR (Flu A&B, Covid) Anterior Nasal Swab     Status: None   Collection Time: 07/30/22 10:05 PM   Specimen: Anterior Nasal Swab  Result Value Ref Range Status   SARS Coronavirus 2 by RT PCR NEGATIVE NEGATIVE Final    Comment: (NOTE) SARS-CoV-2 target nucleic acids are NOT DETECTED.  The SARS-CoV-2 RNA is generally detectable in upper respiratory specimens during the acute phase of infection. The lowest concentration of SARS-CoV-2 viral copies this assay can detect is 138 copies/mL. A negative result does not preclude SARS-Cov-2 infection and should not be used as the sole basis for treatment or other patient management decisions. A negative result may occur with  improper specimen collection/handling,  submission of specimen other than nasopharyngeal swab, presence of viral mutation(s) within the areas targeted by this assay, and inadequate number of viral copies(<138 copies/mL). A negative result must be combined with clinical observations, patient history, and epidemiological information. The expected result is Negative.  Fact Sheet for Patients:  13/12/23  Fact Sheet for Healthcare Providers:  BloggerCourse.com  This test is no t yet approved or cleared by the [employer] FDA and  has been authorized for detection and/or diagnosis of SARS-CoV-2 by FDA under an Emergency Use Authorization (EUA). This EUA will remain  in effect (meaning this test can be used) for the duration of the COVID-19 declaration under Section 564(b)(1) of the Act, 21 U.S.C.section 360bbb-3(b)(1), unless the authorization is terminated  or revoked sooner.  Influenza A by PCR NEGATIVE NEGATIVE Final   Influenza B by PCR NEGATIVE NEGATIVE Final    Comment: (NOTE) The Xpert Xpress SARS-CoV-2/FLU/RSV plus assay is intended as an aid in the diagnosis of influenza from Nasopharyngeal swab specimens and should not be used as a sole basis for treatment. Nasal washings and aspirates are unacceptable for Xpert Xpress SARS-CoV-2/FLU/RSV testing.  Fact Sheet for Patients: BloggerCourse.com  Fact Sheet for Healthcare Providers: [employer]it  This test is not yet approved or cleared by the [place] FDA and has been authorized for detection and/or diagnosis of SARS-CoV-2 by FDA under an Emergency Use Authorization (EUA). This EUA will remain in effect (meaning this test can be used) for the duration of the COVID-19 declaration under Section 564(b)(1) of the Act, 21 U.S.C. section 360bbb-3(b)(1), unless the authorization is terminated or revoked.  Performed at [facility], [address], [place], [place] [address]   Blood culture (routine x 2)     Status: None (Preliminary result)   Collection Time: 07/31/22  2:30 AM   Specimen: BLOOD RIGHT HAND  Result Value Ref Range Status   Specimen Description BLOOD RIGHT HAND  Final   Special Requests   Final    BOTTLES DRAWN AEROBIC AND ANAEROBIC Blood Culture results may not be optimal due to an inadequate volume of blood received in culture bottles   Culture   Final    NO GROWTH < 12 HOURS Performed at [facility], [address] [place], [place] [address]    Report Status PENDING  Incomplete  Blood culture (routine x 2)     Status: None (Preliminary result)   Collection Time: 07/31/22  3:00 AM   Specimen: BLOOD  Result Value Ref Range Status   Specimen Description BLOOD RIGHT ANTECUBITAL  Final   Special Requests   Final    BOTTLES DRAWN AEROBIC AND ANAEROBIC Blood Culture adequate volume   Culture   Final    NO GROWTH < 12 HOURS Performed at [facility], [address] [place], [place] [address]    Report Status PENDING  Incomplete     Marcos Eke, NP [facility]  07/31/2022  12:23 PM

## 2022-07-31 NOTE — Transfer of Care (Signed)
Immediate Anesthesia Transfer of Care Note  Patient: Devin Barker  Procedure(s) Performed: Right Craniectomy for Mass Resection (Right: Head)  Patient Location: PACU  Anesthesia Type:General  Level of Consciousness: awake and alert   Airway & Oxygen Therapy: Patient Spontanous Breathing and Patient connected to face mask oxygen  Post-op Assessment: Report given to RN, Post -op Vital signs reviewed and stable, Patient moving all extremities X 4, and Patient able to stick tongue midline  Post vital signs: Reviewed and stable  Last Vitals:  Vitals Value Taken Time  BP 139/82 07/31/22 2107  Temp    Pulse 92 07/31/22 2112  Resp 19 07/31/22 2112  SpO2 92 % 07/31/22 2112  Vitals shown include unvalidated device data.  Last Pain:  Vitals:   07/31/22 1542  TempSrc: Oral  PainSc:          Complications: No notable events documented.

## 2022-07-31 NOTE — Op Note (Signed)
07/31/2022  9:56 PM  PATIENT:  Devin Barker  26 y.o. male with a soft tissue mass in the right coronal suture which eroded through the skull but did not violate the dura. He is taken to the operating room for mass resection and diagnosis.   PRE-OPERATIVE DIAGNOSIS:  Soft Tissue Skull Lesion  POST-OPERATIVE DIAGNOSIS:  Soft Tissue Skull Lesion  PROCEDURE:  Procedure(s): Right Craniectomy for Mass Resection  SURGEON: Surgeon(s): Coletta Memos, MD  ASSISTANTS:none  ANESTHESIA:   general  EBL:  Total I/O In: 1400 [I.V.:1400] Out: 75 [Blood:75]  BLOOD ADMINISTERED:none  COUNT:per nursing  DRAINS: none   SPECIMEN:  Source of Specimen:  right coronal suture  DICTATION: Devin Barker was taken to the operating room, intubated, and placed under a general anesthetic without difficulty. He was positioned supine with his head on a horseshoe. His head was prepped and draped in a sterile manner.  I made the incision with a no 10 bard parker blade. I placed Raney clips on the scalp edges. I used scissors to dissect over the palpable mass. I used curettes to define the bony edges, with this dissection what appeared to be purulent material was expressed. I cultured this liquid and sent the specimen to the lab.  I continued to define the bony edge. I used cautery to create a soft tissue equivalent of the skull defect underlying it. I elevated the soft tissue and more abnormal tissue was identified and sent both for pathological review but also microbiology review. I used curettes to scrape the inner table. I removed more of the soft tissue adherent to the dura. Not wanting to violate the dura, I relied on cautery to stop the bleeding and to kill the soft tissue. I irrigated copiously. I placed a burr hole cover to cover the skull defect. I closed the galea with suture. The scalp was closed with staples. I applied a sterile dressing. He was extubated in the OR and  taken to the PACU.  PLAN OF CARE: Admit to inpatient   PATIENT DISPOSITION:  PACU - hemodynamically stable.   Delay start of Pharmacological VTE agent (>24hrs) due to surgical blood loss or risk of bleeding:  no

## 2022-07-31 NOTE — Anesthesia Preprocedure Evaluation (Signed)
Anesthesia Evaluation  Patient identified by MRN, date of birth, ID band Patient awake  General Assessment Comment:History noted Dr. Chilton Si  Reviewed: Allergy & Precautions, NPO status , Patient's Chart, lab work & pertinent test results  Airway Mallampati: II       Dental   Pulmonary Current Smoker and Patient abstained from smoking.   breath sounds clear to auscultation       Cardiovascular negative cardio ROS  Rhythm:Regular Rate:Normal     Neuro/Psych    GI/Hepatic negative GI ROS, Neg liver ROS,,,  Endo/Other  negative endocrine ROS    Renal/GU negative Renal ROS     Musculoskeletal   Abdominal   Peds  Hematology   Anesthesia Other Findings   Reproductive/Obstetrics                             Anesthesia Physical Anesthesia Plan  ASA: 2 and emergent  Anesthesia Plan: General   Post-op Pain Management:    Induction:   PONV Risk Score and Plan: Ondansetron, Dexamethasone, Midazolam and Treatment may vary due to age or medical condition  Airway Management Planned: Oral ETT  Additional Equipment:   Intra-op Plan:   Post-operative Plan: Extubation in OR  Informed Consent: I have reviewed the patients History and Physical, chart, labs and discussed the procedure including the risks, benefits and alternatives for the proposed anesthesia with the patient or authorized representative who has indicated his/her understanding and acceptance.     Dental advisory given  Plan Discussed with: CRNA, Anesthesiologist and Surgeon  Anesthesia Plan Comments:        Anesthesia Quick Evaluation

## 2022-07-31 NOTE — Progress Notes (Signed)
Pt in SS, waiting for surgery. Family in waiting room per pt request. Family made aware surgery is scheduled for approximately 1900.

## 2022-07-31 NOTE — Anesthesia Procedure Notes (Signed)
Procedure Name: Intubation Date/Time: 07/31/2022 7:32 PM  Performed by: Lynnell Chad, CRNAPre-anesthesia Checklist: Patient identified, Emergency Drugs available, Suction available and Patient being monitored Patient Re-evaluated:Patient Re-evaluated prior to induction Oxygen Delivery Method: Circle System Utilized Preoxygenation: Pre-oxygenation with 100% oxygen Induction Type: IV induction Ventilation: Mask ventilation without difficulty Laryngoscope Size: Miller and 2 Grade View: Grade I Tube type: Oral Tube size: 7.5 mm Number of attempts: 1 Airway Equipment and Method: Stylet and Oral airway Placement Confirmation: ETT inserted through vocal cords under direct vision, positive ETCO2 and breath sounds checked- equal and bilateral Secured at: 21 cm Tube secured with: Tape Dental Injury: Teeth and Oropharynx as per pre-operative assessment
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
## 2022-08-01 DIAGNOSIS — G060 Intracranial abscess and granuloma: Secondary | ICD-10-CM | POA: Diagnosis not present

## 2022-08-01 LAB — SYPHILIS: RPR W/REFLEX TO RPR TITER AND TREPONEMAL ANTIBODIES, TRADITIONAL SCREENING AND DIAGNOSIS ALGORITHM: RPR Ser Ql: NONREACTIVE

## 2022-08-01 LAB — MRSA NEXT GEN BY PCR, NASAL: MRSA by PCR Next Gen: NOT DETECTED

## 2022-08-01 LAB — URINE CYTOLOGY ANCILLARY ONLY
Chlamydia: NEGATIVE
Comment: NEGATIVE
Comment: NORMAL
Neisseria Gonorrhea: NEGATIVE

## 2022-08-01 MED ORDER — GUAIFENESIN-DM 100-10 MG/5ML PO SYRP
5.0000 mL | ORAL_SOLUTION | ORAL | Status: DC | PRN
  Administered 2022-08-01 (×2): 5 mL via ORAL
  Filled 2022-08-01 (×2): qty 10

## 2022-08-01 MED ORDER — MORPHINE SULFATE (PF) 2 MG/ML IV SOLN
4.0000 mg | INTRAVENOUS | Status: DC | PRN
  Administered 2022-08-01 (×2): 4 mg via INTRAVENOUS
  Filled 2022-08-01 (×2): qty 2

## 2022-08-01 MED ORDER — CHLORHEXIDINE GLUCONATE CLOTH 2 % EX PADS
6.0000 | MEDICATED_PAD | Freq: Every day | CUTANEOUS | Status: DC
  Administered 2022-08-01: 6 via TOPICAL

## 2022-08-01 MED ORDER — KETOROLAC TROMETHAMINE 30 MG/ML IJ SOLN
30.0000 mg | Freq: Once | INTRAMUSCULAR | Status: AC
  Administered 2022-08-01: 30 mg via INTRAVENOUS
  Filled 2022-08-01: qty 1

## 2022-08-01 MED ORDER — MORPHINE SULFATE (PF) 4 MG/ML IV SOLN
4.0000 mg | INTRAVENOUS | Status: DC | PRN
  Administered 2022-08-01 (×2): 4 mg via INTRAVENOUS
  Filled 2022-08-01 (×2): qty 1

## 2022-08-01 MED ORDER — MORPHINE SULFATE (PF) 2 MG/ML IV SOLN
INTRAVENOUS | Status: AC
  Filled 2022-08-01: qty 1

## 2022-08-01 MED ORDER — ORAL CARE MOUTH RINSE: 15.0000 mL | OROMUCOSAL | Status: DC | PRN

## 2022-08-01 MED ORDER — NICOTINE 7 MG/24HR TD PT24
7.0000 mg | MEDICATED_PATCH | Freq: Once | TRANSDERMAL | Status: AC
  Administered 2022-08-01: 7 mg via TRANSDERMAL
  Filled 2022-08-01: qty 1

## 2022-08-01 MED ORDER — MENTHOL 3 MG MT LOZG
1.0000 | LOZENGE | OROMUCOSAL | Status: DC | PRN
  Filled 2022-08-01: qty 9

## 2022-08-01 NOTE — Progress Notes (Signed)
Patient ID: Devin Barker, male   DOB: 11-22-1995, 26 y.o.   MRN: [id number] BP (!) 128/50   Pulse 90   Temp 98.3 F (36.8 C) (Oral)   Resp (!) 27   Ht 5\' 7"  (1.702 m)   Wt 122.5 kg   SpO2 94%   BMI 42.29 kg/m  No findings yet about the mass. He is ok to transfer to progressive. Will give toradol.

## 2022-08-01 NOTE — Progress Notes (Signed)
Patient ID: Devin Barker, male   DOB: 1995-12-12, 26 y.o.   MRN: [id number] BP 95/69   Pulse 84   Temp 98.2 F (36.8 C) (Axillary)   Resp (!) 23   Ht 5\' 7"  (1.702 m)   Wt 122.5 kg   SpO2 95%   BMI 42.29 kg/m  Alert and oriented x 4, speech is clear, fluent Perrl, full eom Moving all extremities Wound is clean, dry, and without signs of infection No pathology report, no organisms identified

## 2022-08-01 NOTE — Progress Notes (Signed)
PROGRESS NOTE  Zathan Berth [id number] DOB: 11/18/1995 DOA: 07/30/2022 PCP: Patient, No Pcp Per   LOS: 1 day   Brief Narrative / Interim history: 26 year old male without significant past medical history who comes into the hospital with right-sided headache and vomiting.  Imaging in the emergency room shows soft tissue mass, skull defect with some erosion and dural enhancement, concerning for abscess.  Patient's urine drug screen was positive for opioids, cocaine, amphetamines, THC.  He admits to shooting cocaine.   Subjective / 24h Interval events: He did not expect to have this much pain at the surgical site.  Pain wraps around behind his ear and onto the right side of his neck.  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 Christella Noa consulted, patient was taken to the OR on 11/13 status post right craniectomy for resection.  Operative note mention that purulent material was expressed -ID consulted, continue broad-spectrum antibiotics, Gram stain was negative but cultures are still pending.   Active problems Headache, nausea, vomiting-all resolved now except for the postop pain   Polysubstance abuse-UDS positive for opiates, cocaine, amphetamines, THC.  Patient would like the treatment team to refrain in discussing his polysubstance use with his family   Chronic thrombocytosis-monitor  Early URI-afebrile.  Symptomatic management  Scheduled Meds:  Chlorhexidine Gluconate Cloth  6 each Topical Q0600   HYDROmorphone       morphine (PF)       nicotine  7 mg Transdermal Once   Continuous Infusions:  0.9 % NaCl with  KCl 20 mEq / L 80 mL/hr at 08/01/22 0800   ceFEPime (MAXIPIME) IV Stopped (08/01/22 0339)   metronidazole 500 mg (08/01/22 0942)   vancomycin Stopped (08/01/22 0602)   PRN Meds:.acetaminophen **OR** acetaminophen, guaiFENesin-dextromethorphan, HYDROcodone-acetaminophen, HYDROmorphone, labetalol, menthol-cetylpyridinium, morphine (PF), morphine injection, naLOXone (NARCAN)  injection, ondansetron **OR** ondansetron (ZOFRAN) IV, mouth rinse, promethazine  No current outpatient medications  Diet Orders (From admission, onward)     Start     Ordered   08/01/22 0245  Diet regular Room service appropriate? Yes; Fluid consistency: Thin  Diet effective now       Question Answer Comment  Room service appropriate? Yes   Fluid consistency: Thin      08/01/22 0244            DVT prophylaxis: SCDs Start: 07/31/22 2250 SCDs Start: 07/31/22 0328   Lab Results  Component Value Date   PLT 555 (H) 07/31/2022      Code Status: Full Code  Family Communication: Parents present at bedside  Status is: Inpatient  Remains inpatient appropriate because: severity of illness  Level of care: ICU  Consultants:  Neurosurgery   Objective: Vitals:   08/01/22 0600 08/01/22 0700 08/01/22 0800 08/01/22 0900  BP: 108/61 120/60 (!) 140/74 138/71  Pulse: 67 69 83 69  Resp: 18 18 (!) 28 17  Temp:   98.5 F (36.9 C)   TempSrc:   Axillary   SpO2: 94% 94% 96% 95%  Weight:      Height:        Intake/Output Summary (Last 24 hours) at 08/01/2022 0945 Last data filed at 08/01/2022 0800 Gross  per 24 hour  Intake 2925.07 ml  Output 775 ml  Net 2150.07 ml   Wt Readings from Last 3 Encounters:  07/31/22 122.5 kg  05/01/22 122.5 kg  12/08/16 113.4 kg (>99 %, Z= 2.38)*   * Growth percentiles are based on CDC (Boys, 2-20 Years) data.    Examination:  Constitutional: NAD Eyes: no scleral icterus ENMT: Mucous membranes are moist.  Neck: normal, supple Respiratory: clear to auscultation  bilaterally, no wheezing, no crackles. Normal respiratory effort. No accessory muscle use.  Cardiovascular: Regular rate and rhythm, no murmurs / rubs / gallops. No LE edema.  Abdomen: non distended, no tenderness. Bowel sounds positive.  Musculoskeletal: no clubbing / cyanosis.  Skin: no rashes Neurologic: non focal   Data Reviewed: I have independently reviewed following labs and imaging studies   CBC Recent Labs  Lab 07/31/22 0020 07/31/22 0430  WBC 11.8* 13.5*  HGB 15.4 14.5  HCT 49.1 46.6  PLT 617* 555*  MCV 84.1 85.7  MCH 26.4 26.7  MCHC 31.4 31.1  RDW 14.5 14.6  LYMPHSABS 2.8  --   MONOABS 0.5  --   EOSABS 0.5  --   BASOSABS 0.1  --     Recent Labs  Lab 07/30/22 2220 07/31/22 0430  NA 138  --   K 4.3  --   CL 101  --   CO2 26  --   GLUCOSE 111*  --   BUN 6  --   CREATININE 0.83  --   CALCIUM 9.5  --   AST 18  --   ALT 22  --   ALKPHOS 64  --   BILITOT 0.6  --   ALBUMIN 3.8  --   TSH  --  2.682    ------------------------------------------------------------------------------------------------------------------ No results for input(s): "CHOL", "HDL", "LDLCALC", "TRIG", "CHOLHDL", "LDLDIRECT" in the last 72 hours.  No results found for: "HGBA1C" ------------------------------------------------------------------------------------------------------------------ Recent Labs    07/31/22 0430  TSH 2.682    Cardiac Enzymes No results for input(s): "CKMB", "TROPONINI", "MYOGLOBIN" in the last 168 hours.  Invalid input(s): "CK" ------------------------------------------------------------------------------------------------------------------ No results found for: "BNP"  CBG: Recent Labs  Lab 07/30/22 2124  GLUCAP 119*    Recent Results (from the past 240 hour(s))  Resp Panel by RT-PCR (Flu A&B, Covid) Anterior Nasal Swab     Status: None   Collection Time: 07/30/22 10:05 PM   Specimen: Anterior Nasal Swab  Result Value Ref Range Status   SARS  Coronavirus 2 by RT PCR NEGATIVE NEGATIVE Final    Comment: (NOTE) SARS-CoV-2 target nucleic acids are NOT DETECTED.  The SARS-CoV-2 RNA is generally detectable in upper respiratory specimens during the acute phase of infection. The lowest concentration of SARS-CoV-2 viral copies this assay can detect is 138 copies/mL. A negative result does not preclude SARS-Cov-2 infection and should not be used as the sole basis for treatment or other patient management decisions. A negative result may occur with  improper specimen collection/handling, submission of specimen other than nasopharyngeal swab, presence of viral mutation(s) within the areas targeted by this assay, and inadequate number of viral copies(<138 copies/mL). A negative result must be combined with clinical observations, patient history, and epidemiological information. The expected result is Negative.  Fact Sheet for Patients:  BloggerCourse.com  Fact Sheet for Healthcare Providers:  SeriousBroker.it  This test is no t yet approved or cleared by the [place] FDA and  has been authorized for detection and/or diagnosis of SARS-CoV-2 by FDA under an Emergency  Use Authorization (EUA). This EUA will remain  in effect (meaning this test can be used) for the duration of the COVID-19 declaration under Section 564(b)(1) of the Act, 21 U.S.C.section 360bbb-3(b)(1), unless the authorization is terminated  or revoked sooner.       Influenza A by PCR NEGATIVE NEGATIVE Final   Influenza B by PCR NEGATIVE NEGATIVE Final    Comment: (NOTE) The Xpert Xpress SARS-CoV-2/FLU/RSV plus assay is intended as an aid in the diagnosis of influenza from Nasopharyngeal swab specimens and should not be used as a sole basis for treatment. Nasal washings and aspirates are unacceptable for Xpert Xpress SARS-CoV-2/FLU/RSV testing.  Fact Sheet for  Patients: EntrepreneurPulse.com.au  Fact Sheet for Healthcare Providers: IncredibleEmployment.be  This test is not yet approved or cleared by the [place] FDA and has been authorized for detection and/or diagnosis of SARS-CoV-2 by FDA under an Emergency Use Authorization (EUA). This EUA will remain in effect (meaning this test can be used) for the duration of the COVID-19 declaration under Section 564(b)(1) of the Act, 21 U.S.C. section 360bbb-3(b)(1), unless the authorization is terminated or revoked.  Performed at [facility], [place] [address] [place], [place] [address]   Blood culture (routine x 2)     Status: None (Preliminary result)   Collection Time: 07/31/22  2:30 AM   Specimen: BLOOD RIGHT HAND  Result Value Ref Range Status   Specimen Description BLOOD RIGHT HAND  Final   Special Requests   Final    BOTTLES DRAWN AEROBIC AND ANAEROBIC Blood Culture results may not be optimal due to an inadequate volume of blood received in culture bottles   Culture   Final    NO GROWTH < 12 HOURS Performed at [facility], [place] [address], [place], [place] [address]    Report Status PENDING  Incomplete  Blood culture (routine x 2)     Status: None (Preliminary result)   Collection Time: 07/31/22  3:00 AM   Specimen: BLOOD  Result Value Ref Range Status   Specimen Description BLOOD RIGHT ANTECUBITAL  Final   Special Requests   Final    BOTTLES DRAWN AEROBIC AND ANAEROBIC Blood Culture adequate volume   Culture   Final    NO GROWTH < 12 HOURS Performed at [facility], [place] [address] [place], [place] [address]    Report Status PENDING  Incomplete  Aerobic/Anaerobic Culture w Gram Stain (surgical/deep wound)     Status: None (Preliminary result)   Collection Time: 07/31/22  8:05 PM   Specimen: Soft Tissue, Other  Result Value Ref Range Status   Specimen Description WOUND  Final   Special Requests SWABS OF RIGHT CORONAL  SUTURE MASS  Final   Gram Stain   Final    RARE WBC PRESENT, PREDOMINANTLY MONONUCLEAR NO ORGANISMS SEEN    Culture   Final    NO GROWTH < 12 HOURS Performed at [facility], [place] [address] [place], [place] [address]    Report Status PENDING  Incomplete  Aerobic/Anaerobic Culture w Gram Stain (surgical/deep wound)     Status: None (Preliminary result)   Collection Time: 07/31/22  8:16 PM   Specimen: Soft Tissue, Other  Result Value Ref Range Status   Specimen Description TISSUE  Final   Special Requests RT CORONAL SUTURE MASS  Final   Gram Stain NO WBC SEEN NO ORGANISMS SEEN   Final   Culture   Final    NO GROWTH <  12 HOURS Performed at [facility], [place] [address] [place], [place] [address]    Report Status PENDING  Incomplete  MRSA Next Gen by PCR, Nasal     Status: None   Collection Time: 08/01/22  2:15 AM   Specimen: Nasal Mucosa; Nasal Swab  Result Value Ref Range Status   MRSA by PCR Next Gen NOT DETECTED NOT DETECTED Final    Comment: (NOTE) The GeneXpert MRSA Assay (FDA approved for NASAL specimens only), is one component of a comprehensive MRSA colonization surveillance program. It is not intended to diagnose MRSA infection nor to guide or monitor treatment for MRSA infections. Test performance is not FDA approved in patients less than 6 years old. Performed at [facility], [place] [address] [place], [place] [address]      Radiology Studies: No results found.   Marzetta Board, MD, PhD [facility]  Between 7 am - 7 pm I am available, please contact me via Amion (for emergencies) or Securechat (non urgent messages)  Between 7 pm - 7 am I am not available, please contact night coverage MD/APP via Amion

## 2022-08-01 NOTE — Progress Notes (Signed)
[facility]  Date of Admission:  07/30/2022     Total days of antibiotics 2         ASSESSMENT:  [name] is POD #1 from right craniectomy for mass resection with surgical specimens showing no organisms on gram stain and cultures without growth in <24 hours. AFB cultures added today. Continue with broad spectrum coverage with vancomycin and cefepime. Post-operative wound care per Neurosurgery. Monitor cultures for identification of organisms with narrowing of antibiotics as able.  Remaining medical and supportive care per primary team.   PLAN:  Continue vancomycin and cefepime.  Monitor cultures for organism identification. AFB cultures added. Post-op wound care per Neurosurgery. Remaining medical and supportive care per primary team.   Principal Problem:   Intracranial abscess Active Problems:   Polysubstance abuse (HCC)   Thrombocytosis   Nausea and vomiting   Proptosis   Status post surgery    Chlorhexidine Gluconate Cloth  6 each Topical Q0600   nicotine  7 mg Transdermal Once    SUBJECTIVE:  Afebrile overnight with no acute events. Having pain along surgical site and right side of face.   Allergies  Allergen Reactions   Other     Pt prefers medications that do not have gelatins. No capsules, gel caps etc....due to religious reasons  Pt also had a reaction to laughing gas (liquid form)      Review of Systems: Review of Systems  Constitutional:  Negative for chills, fever and weight loss.  Respiratory:  Negative for cough, shortness of breath and wheezing.   Cardiovascular:  Negative for chest pain and leg swelling.  Gastrointestinal:  Negative for abdominal pain, constipation, diarrhea, nausea and vomiting.  Skin:  Negative for rash.  Neurological:  Positive for headaches.      OBJECTIVE: Vitals:   08/01/22 1000 08/01/22 1100 08/01/22 1156 08/01/22 1200  BP: (!) 128/50   (!) 117/55  Pulse: 68 90  80  Resp: (!) 22  (!) 27  (!) 28  Temp:   98.3 F (36.8 C)   TempSrc:   Oral   SpO2: 94% 94%  94%  Weight:      Height:       Body mass index is 42.29 kg/m.  Physical Exam Constitutional:      General: He is not in acute distress.    Appearance: He is well-developed.  HENT:     Head:     Comments: Surgical site is clean and incision is well approximated.  Cardiovascular:     Rate and Rhythm: Normal rate and regular rhythm.     Heart sounds: Normal heart sounds.  Pulmonary:     Effort: Pulmonary effort is normal.     Breath sounds: Normal breath sounds.  Skin:    General: Skin is warm and dry.  Neurological:     Mental Status: He is alert and oriented to person, place, and time.  Psychiatric:        Behavior: Behavior normal.        Thought Content: Thought content normal.        Judgment: Judgment normal.     Lab Results Lab Results  Component Value Date   WBC 13.5 (H) 07/31/2022   HGB 14.5 07/31/2022   HCT 46.6 07/31/2022   MCV 85.7 07/31/2022   PLT 555 (H) 07/31/2022    Lab Results  Component Value Date   CREATININE 0.83 07/30/2022   BUN 6 07/30/2022   NA 138  07/30/2022   K 4.3 07/30/2022   CL 101 07/30/2022   CO2 26 07/30/2022    Lab Results  Component Value Date   ALT 22 07/30/2022   AST 18 07/30/2022   ALKPHOS 64 07/30/2022   BILITOT 0.6 07/30/2022     Microbiology: Recent Results (from the past 240 hour(s))  Resp Panel by RT-PCR (Flu A&B, Covid) Anterior Nasal Swab     Status: None   Collection Time: 07/30/22 10:05 PM   Specimen: Anterior Nasal Swab  Result Value Ref Range Status   SARS Coronavirus 2 by RT PCR NEGATIVE NEGATIVE Final    Comment: (NOTE) SARS-CoV-2 target nucleic acids are NOT DETECTED.  The SARS-CoV-2 RNA is generally detectable in upper respiratory specimens during the acute phase of infection. The lowest concentration of SARS-CoV-2 viral copies this assay can detect is 138 copies/mL. A negative result does not preclude  SARS-Cov-2 infection and should not be used as the sole basis for treatment or other patient management decisions. A negative result may occur with  improper specimen collection/handling, submission of specimen other than nasopharyngeal swab, presence of viral mutation(s) within the areas targeted by this assay, and inadequate number of viral copies(<138 copies/mL). A negative result must be combined with clinical observations, patient history, and epidemiological information. The expected result is Negative.  Fact Sheet for Patients:  BloggerCourse.com  Fact Sheet for Healthcare Providers:  SeriousBroker.it  This test is no t yet approved or cleared by the [place] FDA and  has been authorized for detection and/or diagnosis of SARS-CoV-2 by FDA under an Emergency Use Authorization (EUA). This EUA will remain  in effect (meaning this test can be used) for the duration of the COVID-19 declaration under Section 564(b)(1) of the Act, 21 U.S.C.section 360bbb-3(b)(1), unless the authorization is terminated  or revoked sooner.       Influenza A by PCR NEGATIVE NEGATIVE Final   Influenza B by PCR NEGATIVE NEGATIVE Final    Comment: (NOTE) The Xpert Xpress SARS-CoV-2/FLU/RSV plus assay is intended as an aid in the diagnosis of influenza from Nasopharyngeal swab specimens and should not be used as a sole basis for treatment. Nasal washings and aspirates are unacceptable for Xpert Xpress SARS-CoV-2/FLU/RSV testing.  Fact Sheet for Patients: BloggerCourse.com  Fact Sheet for Healthcare Providers: SeriousBroker.it  This test is not yet approved or cleared by the [place] FDA and has been authorized for detection and/or diagnosis of SARS-CoV-2 by FDA under an Emergency Use Authorization (EUA). This EUA will remain in effect (meaning this test can be used) for the duration of  the COVID-19 declaration under Section 564(b)(1) of the Act, 21 U.S.C. section 360bbb-3(b)(1), unless the authorization is terminated or revoked.  Performed at [facility], [address], [place], [place] [address]   Blood culture (routine x 2)     Status: None (Preliminary result)   Collection Time: 07/31/22  2:30 AM   Specimen: BLOOD RIGHT HAND  Result Value Ref Range Status   Specimen Description BLOOD RIGHT HAND  Final   Special Requests   Final    BOTTLES DRAWN AEROBIC AND ANAEROBIC Blood Culture results may not be optimal due to an inadequate volume of blood received in culture bottles   Culture   Final    NO GROWTH 1 DAY Performed at [facility], [address] [place], [place] [address]    Report Status PENDING  Incomplete  Blood culture (routine x 2)     Status:  None (Preliminary result)   Collection Time: 07/31/22  3:00 AM   Specimen: BLOOD  Result Value Ref Range Status   Specimen Description BLOOD RIGHT ANTECUBITAL  Final   Special Requests   Final    BOTTLES DRAWN AEROBIC AND ANAEROBIC Blood Culture adequate volume   Culture   Final    NO GROWTH 1 DAY Performed at [facility], [address], [place], [place] [address]    Report Status PENDING  Incomplete  Aerobic/Anaerobic Culture w Gram Stain (surgical/deep wound)     Status: None (Preliminary result)   Collection Time: 07/31/22  8:05 PM   Specimen: Soft Tissue, Other  Result Value Ref Range Status   Specimen Description WOUND  Final   Special Requests SWABS OF RIGHT CORONAL SUTURE MASS  Final   Gram Stain   Final    RARE WBC PRESENT, PREDOMINANTLY MONONUCLEAR NO ORGANISMS SEEN    Culture   Final    NO GROWTH < 12 HOURS Performed at [facility], [address] [place], [place] [address]    Report Status PENDING  Incomplete  Aerobic/Anaerobic Culture w Gram Stain (surgical/deep wound)     Status: None (Preliminary result)   Collection Time: 07/31/22  8:16 PM   Specimen:  Soft Tissue, Other  Result Value Ref Range Status   Specimen Description TISSUE  Final   Special Requests RT CORONAL SUTURE MASS  Final   Gram Stain NO WBC SEEN NO ORGANISMS SEEN   Final   Culture   Final    NO GROWTH < 12 HOURS Performed at [facility], [address], [place], [place] [address]    Report Status PENDING  Incomplete  MRSA Next Gen by PCR, Nasal     Status: None   Collection Time: 08/01/22  2:15 AM   Specimen: Nasal Mucosa; Nasal Swab  Result Value Ref Range Status   MRSA by PCR Next Gen NOT DETECTED NOT DETECTED Final    Comment: (NOTE) The GeneXpert MRSA Assay (FDA approved for NASAL specimens only), is one component of a comprehensive MRSA colonization surveillance program. It is not intended to diagnose MRSA infection nor to guide or monitor treatment for MRSA infections. Test performance is not FDA approved in patients less than 64 years old. Performed at [facility], [address], [place], [place] [address]      Marcos Eke, NP [facility]  08/01/2022  3:38 PM

## 2022-08-02 ENCOUNTER — Inpatient Hospital Stay (HOSPITAL_COMMUNITY): Payer: BLUE CROSS/BLUE SHIELD

## 2022-08-02 ENCOUNTER — Encounter (HOSPITAL_COMMUNITY): Payer: Self-pay | Admitting: Neurosurgery

## 2022-08-02 DIAGNOSIS — G060 Intracranial abscess and granuloma: Secondary | ICD-10-CM | POA: Diagnosis not present

## 2022-08-02 LAB — BASIC METABOLIC PANEL
Anion gap: 7 (ref 5–15)
BUN: 6 mg/dL (ref 6–20)
CO2: 22 mmol/L (ref 22–32)
Calcium: 8.3 mg/dL — ABNORMAL LOW (ref 8.9–10.3)
Chloride: 107 mmol/L (ref 98–111)
Creatinine, Ser: 0.61 mg/dL (ref 0.61–1.24)
GFR, Estimated: >60 mL/min (ref >60)
Glucose, Bld: 138 mg/dL — ABNORMAL HIGH (ref 70–99)
Potassium: 3.5 mmol/L (ref 3.5–5.1)
Sodium: 136 mmol/L (ref 135–145)

## 2022-08-02 LAB — CBC
HCT: 41.7 % (ref 39.0–52.0)
Hemoglobin: 13.4 g/dL (ref 13.0–17.0)
MCH: 26.4 pg (ref 26.0–34.0)
MCHC: 32.1 g/dL (ref 30.0–36.0)
MCV: 82.2 fL (ref 80.0–100.0)
Platelets: 511 10*3/uL — ABNORMAL HIGH (ref 150–400)
RBC: 5.07 MIL/uL (ref 4.22–5.81)
RDW: 14.4 % (ref 11.5–15.5)
WBC: 13.9 10*3/uL — ABNORMAL HIGH (ref 4.0–10.5)
nRBC: 0 % (ref 0.0–0.2)

## 2022-08-02 LAB — VANCOMYCIN, TROUGH
Vancomycin Tr: 35 ug/mL (ref 15–20)
Vancomycin Tr: 10 ug/mL — ABNORMAL LOW (ref 15–20)

## 2022-08-02 MED ORDER — HYDROCODONE-ACETAMINOPHEN 5-325 MG PO TABS
1.0000 | ORAL_TABLET | ORAL | Status: DC | PRN
  Administered 2022-08-02 – 2022-08-03 (×3): 2 via ORAL
  Administered 2022-08-03: 1 via ORAL
  Administered 2022-08-03 (×2): 2 via ORAL
  Administered 2022-08-04: 1 via ORAL
  Administered 2022-08-04 (×2): 2 via ORAL
  Administered 2022-08-04: 1 via ORAL
  Administered 2022-08-05 – 2022-08-15 (×36): 2 via ORAL
  Administered 2022-08-15: 1 via ORAL
  Administered 2022-08-15 (×2): 2 via ORAL
  Administered 2022-08-15: 1 via ORAL
  Administered 2022-08-16: 2 via ORAL
  Administered 2022-08-16: 1 via ORAL
  Administered 2022-08-16 (×3): 2 via ORAL
  Administered 2022-08-16: 1 via ORAL
  Administered 2022-08-17 – 2022-08-19 (×10): 2 via ORAL
  Filled 2022-08-02 (×16): qty 2
  Filled 2022-08-02: qty 1
  Filled 2022-08-02 (×28): qty 2
  Filled 2022-08-02: qty 1
  Filled 2022-08-02 (×20): qty 2
  Filled 2022-08-02: qty 1
  Filled 2022-08-02: qty 2
  Filled 2022-08-02: qty 1
  Filled 2022-08-02: qty 2

## 2022-08-02 MED ORDER — VANCOMYCIN HCL 1750 MG/350ML IV SOLN
1750.0000 mg | Freq: Three times a day (TID) | INTRAVENOUS | Status: DC
  Filled 2022-08-02: qty 350

## 2022-08-02 MED ORDER — NICOTINE 7 MG/24HR TD PT24
7.0000 mg | MEDICATED_PATCH | Freq: Every day | TRANSDERMAL | Status: DC
  Administered 2022-08-02 – 2022-08-20 (×15): 7 mg via TRANSDERMAL
  Filled 2022-08-02 (×20): qty 1

## 2022-08-02 MED ORDER — VANCOMYCIN HCL 1750 MG/350ML IV SOLN
1750.0000 mg | Freq: Three times a day (TID) | INTRAVENOUS | Status: DC
  Administered 2022-08-02 – 2022-08-04 (×6): 1750 mg via INTRAVENOUS
  Filled 2022-08-02 (×6): qty 350

## 2022-08-02 MED ORDER — MORPHINE SULFATE (PF) 2 MG/ML IV SOLN
4.0000 mg | INTRAVENOUS | Status: DC | PRN
  Administered 2022-08-02 (×3): 4 mg via INTRAVENOUS
  Filled 2022-08-02 (×3): qty 2

## 2022-08-02 NOTE — Progress Notes (Signed)
Patient ID: Devin Barker, male   DOB: 03-13-1996, 26 y.o.   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 No malignancy, not an infection.  Discharge tomorrow Wound is clean, dry, no signs of infection.

## 2022-08-02 NOTE — TOC Initial Note (Signed)
Transition of Care [facility]) - Initial/Assessment Note    Patient Details  Name: Devin Barker MRN: [id number] Date of Birth: 08-17-1996  [facility] CM/SW Contact:    Kermit Balo, RN Phone Number: 08/02/2022, 2:05 PM  Clinical Narrative:                 Pt is from home with his parents. He states someone is with him most of the time.  Denies issues with transportation. PCP: [facility] Pt was not taking home medications for about 3 months due to change in insurance.  TOC following for d/c needs.  Expected Discharge Plan: Home/Self Care Barriers to Discharge: Continued Medical Work up   Patient Goals and CMS Choice        Expected Discharge Plan and Services Expected Discharge Plan: Home/Self Care   Discharge Planning Services: CM Consult   Living arrangements for the past 2 months: Single Family Home                                      Prior Living Arrangements/Services Living arrangements for the past 2 months: Single Family Home Lives with:: Parents Patient language and need for interpreter reviewed:: Yes Do you feel safe going back to the place where you live?: Yes            Criminal Activity/Legal Involvement Pertinent to Current Situation/Hospitalization: No - Comment as needed  Activities of Daily Living      Permission Sought/Granted                  Emotional Assessment Appearance:: Appears stated age Attitude/Demeanor/Rapport: Engaged Affect (typically observed): Accepting Orientation: : Oriented to Self, Oriented to Place, Oriented to  Time, Oriented to Situation   Psych Involvement: No (comment)  Admission diagnosis:  Intracranial abscess [G06.0] Polysubstance abuse (HCC) [F19.10] Status post surgery [Z98.890] Patient Active Problem List   Diagnosis Date Noted   Intracranial abscess 07/31/2022   Polysubstance abuse (HCC) 07/31/2022   Thrombocytosis 07/31/2022   Nausea and vomiting  07/31/2022   Proptosis 07/31/2022   Status post surgery 07/31/2022   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   PCP:  Patient, No Pcp Per Pharmacy:   [id number] [place], [place] - [address] AT [facility] OF [facility] & [address] [place] [address] Phone: [phone] Fax: [phone]  [id number] [place], [place] - [address] AT [facility] OF [facility] [address] [place] [address] Phone: [phone] Fax: [phone]     Social Determinants of Health (SDOH) Interventions    Readmission Risk Interventions     No data to display

## 2022-08-02 NOTE — Progress Notes (Signed)
Patient complaining of right eye vision blurriness, and some right facial numbness with increased swelling on the right side of his face. Left a message for Dr. Franky Macho and RRT nurse notified. RRT nurse came to bedside to assess. Patients vital signs are stable and STAT MRI ordered. Will continue to monitor.

## 2022-08-02 NOTE — Progress Notes (Addendum)
PROGRESS NOTE  Carder Below [id number] DOB: 1996/09/12 DOA: 07/30/2022 PCP: Patient, No Pcp Per   LOS: 2 days   Brief Narrative / Interim history: 26 year old male without significant past medical history who comes into the hospital with right-sided headache and vomiting.  Imaging in the emergency room shows soft tissue mass, skull defect with some erosion and dural enhancement, concerning for abscess.  Patient's urine drug screen was positive for opioids, cocaine, amphetamines, THC.  He admits to shooting cocaine.   Subjective / 24h Interval events: He did not expect to have this much pain at the surgical site.  Pain wraps around behind his ear and onto the right side of his neck.  Also complains of mild congestion, mild sore throat feels like he is starting to get some URI  Assesement and Plan: Principal Problem:   Intracranial abscess Active Problems:   Polysubstance abuse (HCC)   Thrombocytosis   Nausea and vomiting   Proptosis   Status post surgery   Intracranial abscess/temporalis infection -infection appears to involve the skull and dura -Dr. Christella Noa consulted, patient was taken to the OR on 11/13 status post right craniectomy for resection.  Operative note mention that purulent material was expressed -ID consulted, continue broad-spectrum antibiotics: vancomycin and cefepime -Gram stain was negative but cultures are still pending.   Intractable headache/vision changes -Stat CT head ordered, MRI pending - awaiting neurosurgical evaluation -R lateral hemianopsia(blurred more than truly lost vision) with 'stabbing' headache behind his eye.  Polysubstance abuse-UDS positive for opiates, cocaine, amphetamines, THC.  Patient would like the treatment team to refrain in discussing his polysubstance use with his family  Nausea, vomiting-improved compared to admission   Chronic thrombocytosis-monitor  Early URI-afebrile.  Symptomatic  management  Scheduled Meds:  nicotine  7 mg Transdermal Daily   Continuous Infusions:  0.9 % NaCl with KCl 20 mEq / L 80 mL/hr at 08/01/22 2127   ceFEPime (MAXIPIME) IV 2 g (08/02/22 1200)   metronidazole 500 mg (08/02/22 1015)   vancomycin 1,250 mg (08/02/22 0611)   PRN Meds:.acetaminophen **OR** acetaminophen, guaiFENesin-dextromethorphan, HYDROcodone-acetaminophen, labetalol, menthol-cetylpyridinium, morphine injection, naLOXone (NARCAN)  injection, ondansetron **OR** ondansetron (ZOFRAN) IV, mouth rinse, promethazine  No current outpatient medications  Diet Orders (From admission, onward)     Start     Ordered   08/01/22 0245  Diet regular Room service appropriate? Yes; Fluid consistency: Thin  Diet effective now       Question Answer Comment  Room service appropriate? Yes   Fluid consistency: Thin      08/01/22 0244            DVT prophylaxis: SCDs Start: 07/31/22 2250 SCDs Start: 07/31/22 0328   Lab Results  Component Value Date   PLT 511 (H) 08/02/2022      Code Status: Full Code  Family Communication: Mother at bedside  Status is: Inpatient  Remains inpatient appropriate because: severity of illness  Level of care: Progressive  Consultants:  Neurosurgery   Objective: Vitals:   08/01/22 2343 08/02/22 0358 08/02/22 0840 08/02/22 1200  BP: (!) 124/54 (!) 105/55 (!) 123/42 (!) 126/58  Pulse: 69 62 92 84  Resp: 20 18  18   Temp: 98.3 F (36.8 C) 98 F (36.7 C) 98.4 F (36.9 C) 97.9 F (36.6 C)  TempSrc: Oral Oral Oral Oral  SpO2: 96% 99% 98% 100%  Weight:      Height:        Intake/Output Summary (Last 24 hours) at 08/02/2022 1355 Last  data filed at 08/02/2022 [id number] Gross per 24 hour  Intake 2257.37 ml  Output 550 ml  Net 1707.37 ml   Wt Readings from Last 3 Encounters:  07/31/22 122.5 kg  05/01/22 122.5 kg  12/08/16 113.4 kg (>99 %, Z= 2.38)*   * Growth percentiles are based on CDC (Boys, 2-20 Years) data.     Examination:  Constitutional: NAD HEENMT: Mucous membranes are moist. Moderate edema R temporal area with post op incision clean/dry/intact Neck: normal, supple Respiratory: clear to auscultation bilaterally, no wheezing, no crackles. Normal respiratory effort. No accessory muscle use.  Cardiovascular: Regular rate and rhythm, no murmurs / rubs / gallops. No LE edema.  Abdomen: non distended, no tenderness. Bowel sounds positive.  Musculoskeletal: no clubbing / cyanosis.  Neurologic: R eye vision 'blurring' laterally - non focal otherwise   Data Reviewed: I have independently reviewed following labs and imaging studies   CBC Recent Labs  Lab 07/31/22 0020 07/31/22 0430 08/02/22 0853  WBC 11.8* 13.5* 13.9*  HGB 15.4 14.5 13.4  HCT 49.1 46.6 41.7  PLT 617* 555* 511*  MCV 84.1 85.7 82.2  MCH 26.4 26.7 26.4  MCHC 31.4 31.1 32.1  RDW 14.5 14.6 14.4  LYMPHSABS 2.8  --   --   MONOABS 0.5  --   --   EOSABS 0.5  --   --   BASOSABS 0.1  --   --     Recent Labs  Lab 07/30/22 2220 07/31/22 0430 08/02/22 0853  NA 138  --  136  K 4.3  --  3.5  CL 101  --  107  CO2 26  --  22  GLUCOSE 111*  --  138*  BUN 6  --  6  CREATININE 0.83  --  0.61  CALCIUM 9.5  --  8.3*  AST 18  --   --   ALT 22  --   --   ALKPHOS 64  --   --   BILITOT 0.6  --   --   ALBUMIN 3.8  --   --   TSH  --  2.682  --     ------------------------------------------------------------------------------------------------------------------ No results for input(s): "CHOL", "HDL", "LDLCALC", "TRIG", "CHOLHDL", "LDLDIRECT" in the last 72 hours.  No results found for: "HGBA1C" ------------------------------------------------------------------------------------------------------------------ Recent Labs    07/31/22 0430  TSH 2.682    Cardiac Enzymes No results for input(s): "CKMB", "TROPONINI", "MYOGLOBIN" in the last 168 hours.  Invalid input(s):  "CK" ------------------------------------------------------------------------------------------------------------------ No results found for: "BNP"  CBG: Recent Labs  Lab 07/30/22 2124  GLUCAP 119*    Recent Results (from the past 240 hour(s))  Resp Panel by RT-PCR (Flu A&B, Covid) Anterior Nasal Swab     Status: None   Collection Time: 07/30/22 10:05 PM   Specimen: Anterior Nasal Swab  Result Value Ref Range Status   SARS Coronavirus 2 by RT PCR NEGATIVE NEGATIVE Final    Comment: (NOTE) SARS-CoV-2 target nucleic acids are NOT DETECTED.  The SARS-CoV-2 RNA is generally detectable in upper respiratory specimens during the acute phase of infection. The lowest concentration of SARS-CoV-2 viral copies this assay can detect is 138 copies/mL. A negative result does not preclude SARS-Cov-2 infection and should not be used as the sole basis for treatment or other patient management decisions. A negative result may occur with  improper specimen collection/handling, submission of specimen other than nasopharyngeal swab, presence of viral mutation(s) within the areas targeted by this assay, and inadequate number of viral copies(<138 copies/mL).  A negative result must be combined with clinical observations, patient history, and epidemiological information. The expected result is Negative.  Fact Sheet for Patients:  EntrepreneurPulse.com.au  Fact Sheet for Healthcare Providers:  IncredibleEmployment.be  This test is no t yet approved or cleared by the [place] FDA and  has been authorized for detection and/or diagnosis of SARS-CoV-2 by FDA under an Emergency Use Authorization (EUA). This EUA will remain  in effect (meaning this test can be used) for the duration of the COVID-19 declaration under Section 564(b)(1) of the Act, 21 U.S.C.section 360bbb-3(b)(1), unless the authorization is terminated  or revoked sooner.       Influenza A by PCR  NEGATIVE NEGATIVE Final   Influenza B by PCR NEGATIVE NEGATIVE Final    Comment: (NOTE) The Xpert Xpress SARS-CoV-2/FLU/RSV plus assay is intended as an aid in the diagnosis of influenza from Nasopharyngeal swab specimens and should not be used as a sole basis for treatment. Nasal washings and aspirates are unacceptable for Xpert Xpress SARS-CoV-2/FLU/RSV testing.  Fact Sheet for Patients: EntrepreneurPulse.com.au  Fact Sheet for Healthcare Providers: IncredibleEmployment.be  This test is not yet approved or cleared by the [place] FDA and has been authorized for detection and/or diagnosis of SARS-CoV-2 by FDA under an Emergency Use Authorization (EUA). This EUA will remain in effect (meaning this test can be used) for the duration of the COVID-19 declaration under Section 564(b)(1) of the Act, 21 U.S.C. section 360bbb-3(b)(1), unless the authorization is terminated or revoked.  Performed at [facility], [place] [address] [place], [place] [address]   Blood culture (routine x 2)     Status: None (Preliminary result)   Collection Time: 07/31/22  2:30 AM   Specimen: BLOOD RIGHT HAND  Result Value Ref Range Status   Specimen Description BLOOD RIGHT HAND  Final   Special Requests   Final    BOTTLES DRAWN AEROBIC AND ANAEROBIC Blood Culture results may not be optimal due to an inadequate volume of blood received in culture bottles   Culture   Final    NO GROWTH 2 DAYS Performed at [facility], [place] [address] [place], [place] [address]    Report Status PENDING  Incomplete  Blood culture (routine x 2)     Status: None (Preliminary result)   Collection Time: 07/31/22  3:00 AM   Specimen: BLOOD  Result Value Ref Range Status   Specimen Description BLOOD RIGHT ANTECUBITAL  Final   Special Requests   Final    BOTTLES DRAWN AEROBIC AND ANAEROBIC Blood Culture adequate volume   Culture   Final    NO GROWTH 2 DAYS Performed at [facility], [place] [address], [place], [place] [address]    Report Status PENDING  Incomplete  Aerobic/Anaerobic Culture w Gram Stain (surgical/deep wound)     Status: None (Preliminary result)   Collection Time: 07/31/22  8:05 PM   Specimen: Soft Tissue, Other  Result Value Ref Range Status   Specimen Description WOUND  Final   Special Requests SWABS OF RIGHT CORONAL SUTURE MASS  Final   Gram Stain   Final    RARE WBC PRESENT, PREDOMINANTLY MONONUCLEAR NO ORGANISMS SEEN    Culture   Final    NO GROWTH 2 DAYS NO ANAEROBES ISOLATED; CULTURE IN PROGRESS FOR 5 DAYS Performed at [facility], [place] [address], [place], [place] [address]    Report Status PENDING  Incomplete  Aerobic/Anaerobic Culture w Gram Stain (  surgical/deep wound)     Status: None (Preliminary result)   Collection Time: 07/31/22  8:16 PM   Specimen: Soft Tissue, Other  Result Value Ref Range Status   Specimen Description TISSUE  Final   Special Requests RT CORONAL SUTURE MASS  Final   Gram Stain NO WBC SEEN NO ORGANISMS SEEN   Final   Culture   Final    NO GROWTH 2 DAYS NO ANAEROBES ISOLATED; CULTURE IN PROGRESS FOR 5 DAYS Performed at [facility], [address] [place], [place] [address]    Report Status PENDING  Incomplete  MRSA Next Gen by PCR, Nasal     Status: None   Collection Time: 08/01/22  2:15 AM   Specimen: Nasal Mucosa; Nasal Swab  Result Value Ref Range Status   MRSA by PCR Next Gen NOT DETECTED NOT DETECTED Final    Comment: (NOTE) The GeneXpert MRSA Assay (FDA approved for NASAL specimens only), is one component of a comprehensive MRSA colonization surveillance program. It is not intended to diagnose MRSA infection nor to guide or monitor treatment for MRSA infections. Test performance is not FDA approved in patients less than 84 years old. Performed at [facility], [place] [address], [place], [place] [address]   Culture, fungus without smear     Status: None (Preliminary  result)   Collection Time: 08/01/22  9:16 AM   Specimen: Tissue; Other  Result Value Ref Range Status   Specimen Description TISSUE  Final   Special Requests NONE  Final   Culture   Final    NO FUNGUS ISOLATED AFTER 1 DAY Performed at [facility], [address], [place], [place] [address]    Report Status PENDING  Incomplete  Culture, fungus without smear     Status: None (Preliminary result)   Collection Time: 08/01/22  9:30 AM   Specimen: Tissue  Result Value Ref Range Status   Specimen Description TISSUE  Final   Special Requests NONE  Final   Culture   Final    NO FUNGUS ISOLATED AFTER 1 DAY Performed at [facility], [place] [address] [place], [place] [address]    Report Status PENDING  Incomplete     Radiology Studies: CT HEAD WO CONTRAST (5MM)  Result Date: 08/02/2022 CLINICAL DATA:  vision changes EXAM: CT HEAD WITHOUT CONTRAST TECHNIQUE: Contiguous axial images were obtained from the base of the skull through the vertex without intravenous contrast. RADIATION DOSE REDUCTION: This exam was performed according to the departmental dose-optimization program which includes automated exposure control, adjustment of the mA and/or kV according to patient size and/or use of iterative reconstruction technique. COMPARISON:  MRI July 31, 2022. FINDINGS: Brain: Postoperative changes of resection of lesion along the right frontoparietal suture with trace subjacent extra-axial hemorrhage. No significant mass effect. No evidence of acute large vascular territory infarct, midline shift, acute intraparenchymal hemorrhage, or hydrocephalus. Vascular: No hyperdense vessel identified. Skull: No acute fracture. 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 Sinuses/Orbits: Clear sinuses.  No acute orbital findings. Other: No mastoid effusions. IMPRESSION: 1. Postoperative changes of resection of lesion along the right  frontoparietal suture with trace subjacent extra-axial hemorrhage. No significant mass effect. 2. No evidence of acute intracranial abnormality. MRI could provide more sensitive evaluation if clinically warranted. 3. 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 Electronically Signed   By: Margaretha Sheffield M.D.   On: 08/02/2022 11:30  Carma Leaven DO [facility]  Between 7 am - 7 pm I am available, please contact me via Amion (for emergencies) or Securechat (non urgent messages)  Between 7 pm - 7 am I am not available, please contact night coverage MD/APP via Amion

## 2022-08-02 NOTE — Discharge Instructions (Signed)
Craniotomy °Care After °Please read the instructions outlined below and refer to this sheet in the next few weeks. These discharge instructions provide you with general information on caring for yourself after you leave the hospital. Your surgeon may also give you specific instructions. While your treatment has been planned according to the most current medical practices available, unavoidable complications occasionally occur. If you have any problems or questions after discharge, please call your surgeon. °Although there are many types of brain surgery, recovery following craniotomy (surgical opening of the skull) is much the same for each. However, recovery depends on many factors. These include the type and severity of brain injury and the type of surgery. It also depends on any nervous system function problems (neurological deficits) before surgery. If the craniotomy was done for cancer, chemotherapy and radiation could follow. You could be in the hospital from 5 days to a couple weeks. This depends on the type of surgery, findings, and whether there are complications. °HOME CARE INSTRUCTIONS  °· It is not unusual to hear a clicking noise after a craniotomy, the plates and screws used to attach the bone flap can sometimes cause this. It is a normal occurrence if this does happen °· Do not drive for 10 days after the operation °· Your scalp may feel spongy for a while, because of fluid under it. This will gradually get better. Occasionally, the surgeon will not replace the bone that was removed to access the brain. If there is a bony defect, the surgeon will ask you to wear a helmet for protection. This is a discussion you should have with your surgeon prior to leaving the hospital (discharge). °· Numbness may persist in some areas of your scalp. °· Take all medications as directed. Sometimes steroids to control swelling are prescribed. Anticonvulsants to prevent seizures may also be given. Do not use alcohol,  other drugs, or medications unless your surgeon says it is OK. °· Keep the wound dry and clean. The wound may be washed gently with soap and water. Then, you may gently blot or dab it dry, without rubbing. Do not take baths, use swimming pools or hot tubs for 10 days, or as instructed by your caregiver. It is best to wait to see you surgeon at your first postoperative visit, and to get directions at that time. °· Only take over-the-counter or prescription medicines for pain, discomfort, or fever as directed by your caregiver. °· You may continue your normal diet, as directed. °· Walking is OK for exercise. Wait at least 3 months before you return to mild, non-contact sports or as your surgeon suggests. Contact sports should be avoided for at least 1 year, unless your surgeon says it is OK. °· If you are prescribed steroids, take them exactly as prescribed. If you start having a decrease in nervous system functions (neurological deficits) and headaches as the dose of steroids is reduced, tell your surgeon right away. °· When the anticonvulsant prescription is finished you no longer need to take it. °SEEK IMMEDIATE MEDICAL CARE IF:  °· You develop nausea, vomiting, severe headaches, confusion, or you have a seizure. °· You develop chest pain, a stiff neck, or difficulty breathing. °· There is redness, swelling, or increasing pain in the wound or pin insertion sites. °· You have an increase in swelling or bruising around the eyes. °· There is drainage or pus coming from the wound. °· You have an oral temperature above 102° F (38.9° C), not controlled by medicine. °·   You notice a foul smell coming from the wound or dressing. °· The wound breaks open (edges not staying together) after the stitches have been removed. °· You develop dizziness or fainting while standing. °· You develop a rash. °· You develop any reaction or side effects to the medications given. °Document Released: 12/05/2005 Document Revised: 11/27/2011  Document Reviewed: 09/13/2009 °ExitCare® Patient Information ©2013 [employer]. ° °

## 2022-08-02 NOTE — Progress Notes (Signed)
[facility]  Date of Admission:  07/30/2022   Total days of inpatient antibiotics 3  Principal Problem:   Intracranial abscess Active Problems:   Polysubstance abuse (HCC)   Thrombocytosis   Nausea and vomiting   Proptosis   Status post surgery          Assessment:  [name] admitted with skull mass/abscess. 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 Report the mass has been there for a couple months with HA. He noted the mass after a fall. NSY engages.  MRI showed peripherally enhancing collection along right frontal lobe c.f abscess, with neoplasm in the differential. -RPR negative. HIV negative, Urine GC negative -Noted right temporal blurry vision this AM, will get imaging. NSY on board.  Recommendations: -Continue vanc+ cefepime+metronidazole -Follow OR Cx(bacterial/fungal AFB), path -CT head stat given right temporal blurry vision was negative for acute intracranial abnormality. Will defer to NSY.  -Fungitell/ Aspergillus Ag   Microbiology:   Antibiotics: Vnac+ cefepime, metro 11/12- Cultures: Blood 11/12p  OR 11/13p   SUBJECTIVE: Reports transient blurry vision in right eye that comes and goes. Started this AM Interval: Afebrile overnight, wbc 13.9k  Review of Systems: Review of Systems  All other systems reviewed and are negative.    Scheduled Meds:  nicotine  7 mg Transdermal Daily   Continuous Infusions:  0.9 % NaCl with KCl 20 mEq / L 80 mL/hr at 08/01/22 2127   ceFEPime (MAXIPIME) IV 2 g (08/02/22 1200)   metronidazole 500 mg (08/02/22 1015)   vancomycin     PRN Meds:.acetaminophen **OR** acetaminophen, guaiFENesin-dextromethorphan, HYDROcodone-acetaminophen, labetalol, menthol-cetylpyridinium, morphine injection, naLOXone (NARCAN)  injection, ondansetron **OR** ondansetron (ZOFRAN) IV, mouth rinse,  promethazine Allergies  Allergen Reactions   Other     Pt prefers medications that do not have gelatins. No capsules, gel caps etc....due to religious reasons  Pt also had a reaction to laughing gas (liquid form)     OBJECTIVE: Vitals:   08/01/22 2343 08/02/22 0358 08/02/22 0840 08/02/22 1200  BP: (!) 124/54 (!) 105/55 (!) 123/42 (!) 126/58  Pulse: 69 62 92 84  Resp: 20 18  18   Temp: 98.3 F (36.8 C) 98 F (36.7 C) 98.4 F (36.9 C) 97.9 F (36.6 C)  TempSrc: Oral Oral Oral Oral  SpO2: 96% 99% 98% 100%  Weight:      Height:       Body mass index is 42.29 kg/m.  Physical Exam Constitutional:      General: He is not in acute distress.    Appearance: He is normal weight. He is not toxic-appearing.  HENT:     Head: Normocephalic and atraumatic.     Comments: Right temporal blurry vision    Right Ear: External ear normal.     Left Ear: External ear normal.     Nose: No congestion or rhinorrhea.     Mouth/Throat:     Mouth: Mucous membranes are moist.     Pharynx: Oropharynx is clear.  Eyes:     Extraocular Movements: Extraocular movements intact.     Conjunctiva/sclera: Conjunctivae normal.     Pupils: Pupils are equal, round, and reactive to light.  Cardiovascular:     Rate and Rhythm: Normal rate and regular rhythm.     Heart sounds: No murmur heard.    No friction rub. No gallop.  Pulmonary:     Effort: Pulmonary effort is normal.     Breath sounds: Normal breath sounds.  Abdominal:     General: Abdomen is flat. Bowel sounds are normal.     Palpations: Abdomen is soft.  Musculoskeletal:        General: No swelling. Normal range of motion.     Cervical back: Normal range of motion and neck supple.  Skin:    General: Skin is warm and dry.  Neurological:     General: No focal deficit present.     Mental Status: He is oriented to person, place, and time.  Psychiatric:        Mood and Affect: Mood normal.       Lab Results Lab Results  Component Value  Date   WBC 13.9 (H) 08/02/2022   HGB 13.4 08/02/2022   HCT 41.7 08/02/2022   MCV 82.2 08/02/2022   PLT 511 (H) 08/02/2022    Lab Results  Component Value Date   CREATININE 0.61 08/02/2022   BUN 6 08/02/2022   NA 136 08/02/2022   K 3.5 08/02/2022   CL 107 08/02/2022   CO2 22 08/02/2022    Lab Results  Component Value Date   ALT 22 07/30/2022   AST 18 07/30/2022   ALKPHOS 64 07/30/2022   BILITOT 0.6 07/30/2022        Danelle Earthly, MD [facility] 08/02/2022, 3:09 PM

## 2022-08-02 NOTE — Progress Notes (Addendum)
Pharmacy Antibiotic Note- follow-up  Devin Barker is a 26 y.o. male admitted on 07/30/2022 with temporalis infection involving skull and dura.  Pharmacy has been consulted for vancomycin and cefepime dosing.  Pt also started on metronidazole.  Vanco trough reported as 10 mcg/mL drawn ~8 hours s/p last dose (given 0611 on 11/15)  Goal trough 15-20 mcg/mL  Plan: Increase vanco 1750 mg IV every 8 hours Re-assess vanco trough level before 4th dose (11/16 1500 dose) Continue Cefepime 2g IV every 8 hours.  Height: 5\' 7"  (170.2 cm) Weight: 122.5 kg (270 lb) IBW/kg (Calculated) : 66.1  Temp (24hrs), Avg:98.1 F (36.7 C), Min:97.9 F (36.6 C), Max:98.4 F (36.9 C)  Recent Labs  Lab 07/30/22 2220 07/31/22 0020 07/31/22 0430 08/02/22 0853 08/02/22 1401  WBC  --  11.8* 13.5* 13.9*  --   CREATININE 0.83  --   --  0.61  --   VANCOTROUGH  --   --   --  35* 10*     Estimated Creatinine Clearance: 177.1 mL/min (by C-G formula based on SCr of 0.61 mg/dL).    Allergies  Allergen Reactions   Other     Pt prefers medications that do not have gelatins. No capsules, gel caps etc....due to religious reasons  Pt also had a reaction to laughing gas (liquid form)     Thank you for allowing pharmacy to be a part of this patient's care.  08/04/22 BS, PharmD, BCPS Clinical Pharmacist 08/02/2022 2:38 PM  Contact: [phone] after 3 PM  "Be curious, not judgmental..." -[phone]

## 2022-08-03 DIAGNOSIS — G060 Intracranial abscess and granuloma: Secondary | ICD-10-CM | POA: Diagnosis not present

## 2022-08-03 LAB — CBC
HCT: 43.5 % (ref 39.0–52.0)
Hemoglobin: 13.7 g/dL (ref 13.0–17.0)
MCH: 26.4 pg (ref 26.0–34.0)
MCHC: 31.5 g/dL (ref 30.0–36.0)
MCV: 84 fL (ref 80.0–100.0)
Platelets: 521 10*3/uL — ABNORMAL HIGH (ref 150–400)
RBC: 5.18 MIL/uL (ref 4.22–5.81)
RDW: 14.5 % (ref 11.5–15.5)
WBC: 12.4 10*3/uL — ABNORMAL HIGH (ref 4.0–10.5)
nRBC: 0 % (ref 0.0–0.2)

## 2022-08-03 LAB — BASIC METABOLIC PANEL
Anion gap: 5 (ref 5–15)
BUN: <5 mg/dL — ABNORMAL LOW (ref 6–20)
CO2: 25 mmol/L (ref 22–32)
Calcium: 8.3 mg/dL — ABNORMAL LOW (ref 8.9–10.3)
Chloride: 104 mmol/L (ref 98–111)
Creatinine, Ser: 0.56 mg/dL — ABNORMAL LOW (ref 0.61–1.24)
GFR, Estimated: >60 mL/min (ref >60)
Glucose, Bld: 91 mg/dL (ref 70–99)
Potassium: 3.9 mmol/L (ref 3.5–5.1)
Sodium: 134 mmol/L — ABNORMAL LOW (ref 135–145)

## 2022-08-03 LAB — VANCOMYCIN, TROUGH: Vancomycin Tr: 14 ug/mL — ABNORMAL LOW (ref 15–20)

## 2022-08-03 NOTE — Progress Notes (Signed)
PROGRESS NOTE  Devin Barker [id number] DOB: 06-19-1996 DOA: 07/30/2022 PCP: Patient, No Pcp Per   LOS: 3 days   Brief Narrative / Interim history: 26 year old male without significant past medical history who comes into the hospital with right-sided headache and vomiting.  Imaging in the emergency room shows soft tissue mass, skull defect with some erosion and dural enhancement, concerning for abscess.  Patient's urine drug screen was positive for opioids, cocaine, amphetamines, THC.  He admits to shooting cocaine.   Subjective / 24h Interval events: He did not expect to have this much pain at the surgical site.  Pain wraps around behind his ear and onto the right side of his neck.  Also complains of mild congestion, mild sore throat feels like he is starting to get some URI  Assesement and Plan: Principal Problem:   Intracranial abscess Active Problems:   Polysubstance abuse (HCC)   Thrombocytosis   Nausea and vomiting   Proptosis   Status post surgery   Intracranial abscess/temporalis infection -infection appears to involve the skull and dura -Dr. Christella Noa consulted, patient was taken to the OR on 11/13 status post right craniectomy for resection.  Operative note mention that purulent material was expressed -ID consulted, continue broad-spectrum antibiotics: vancomycin and cefepime -Gram stain was negative but cultures are still pending -AFB and fungal stain pending   Intractable headache/vision changes -Stat CT head ordered, MRI pending - awaiting neurosurgical evaluation -R lateral hemianopsia(blurred more than truly lost vision) with 'stabbing' headache behind his eye.  Polysubstance abuse-UDS positive for opiates, cocaine, amphetamines, THC.  Patient would like the treatment team to refrain in discussing his polysubstance use with his family  Nausea, vomiting-improved compared to admission   Chronic thrombocytosis-monitor  Early URI-afebrile.   Symptomatic management  Scheduled Meds:  nicotine  7 mg Transdermal Daily   Continuous Infusions:  0.9 % NaCl with KCl 20 mEq / L 80 mL/hr at 08/03/22 0603   ceFEPime (MAXIPIME) IV 2 g (08/03/22 0417)   metronidazole 500 mg (08/03/22 0419)   vancomycin 1,750 mg (08/02/22 2241)   PRN Meds:.acetaminophen **OR** acetaminophen, guaiFENesin-dextromethorphan, HYDROcodone-acetaminophen, labetalol, menthol-cetylpyridinium, morphine injection, naLOXone (NARCAN)  injection, ondansetron **OR** ondansetron (ZOFRAN) IV, mouth rinse, promethazine  No current outpatient medications  Diet Orders (From admission, onward)     Start     Ordered   08/01/22 0245  Diet regular Room service appropriate? Yes; Fluid consistency: Thin  Diet effective now       Question Answer Comment  Room service appropriate? Yes   Fluid consistency: Thin      08/01/22 0244            DVT prophylaxis: SCDs Start: 07/31/22 2250 SCDs Start: 07/31/22 0328   Lab Results  Component Value Date   PLT 521 (H) 08/03/2022      Code Status: Full Code  Family Communication: None present  Status is: Inpatient  Remains inpatient appropriate because: severity of illness; need for IV antibiotics  Level of care: Progressive  Consultants:  Neurosurgery   Objective: Vitals:   08/02/22 1937 08/02/22 1946 08/02/22 2342 08/03/22 0359  BP: 131/60  (!) 119/59 (!) 141/53  Pulse: 73  61 63  Resp: 18  19 17   Temp:  97.9 F (36.6 C) 98.5 F (36.9 C) 98.5 F (36.9 C)  TempSrc:  Oral    SpO2: 99%  99% 96%  Weight:      Height:        Intake/Output Summary (Last 24 hours) at  08/03/2022 0722 Last data filed at 08/03/2022 0555 Gross per 24 hour  Intake 3234.44 ml  Output 1450 ml  Net 1784.44 ml    Wt Readings from Last 3 Encounters:  07/31/22 122.5 kg  05/01/22 122.5 kg  12/08/16 113.4 kg (>99 %, Z= 2.38)*   * Growth percentiles are based on CDC (Boys, 2-20 Years) data.    Examination:  Constitutional:  NAD HEENMT: Mucous membranes are moist. Moderate edema R temporal area with post op incision clean/dry/intact Neck: normal, supple Respiratory: clear to auscultation bilaterally, no wheezing, no crackles. Normal respiratory effort. No accessory muscle use.  Cardiovascular: Regular rate and rhythm, no murmurs / rubs / gallops. No LE edema.  Abdomen: non distended, no tenderness. Bowel sounds positive.  Musculoskeletal: no clubbing / cyanosis.  Neurologic: R eye vision 'blurring' laterally - non focal otherwise   Data Reviewed: I have independently reviewed following labs and imaging studies   CBC Recent Labs  Lab 07/31/22 0020 07/31/22 0430 08/02/22 0853 08/03/22 0605  WBC 11.8* 13.5* 13.9* 12.4*  HGB 15.4 14.5 13.4 13.7  HCT 49.1 46.6 41.7 43.5  PLT 617* 555* 511* 521*  MCV 84.1 85.7 82.2 84.0  MCH 26.4 26.7 26.4 26.4  MCHC 31.4 31.1 32.1 31.5  RDW 14.5 14.6 14.4 14.5  LYMPHSABS 2.8  --   --   --   MONOABS 0.5  --   --   --   EOSABS 0.5  --   --   --   BASOSABS 0.1  --   --   --      Recent Labs  Lab 07/30/22 2220 07/31/22 0430 08/02/22 0853 08/03/22 0605  NA 138  --  136 134*  K 4.3  --  3.5 3.9  CL 101  --  107 104  CO2 26  --  22 25  GLUCOSE 111*  --  138* 91  BUN 6  --  6 <5*  CREATININE 0.83  --  0.61 0.56*  CALCIUM 9.5  --  8.3* 8.3*  AST 18  --   --   --   ALT 22  --   --   --   ALKPHOS 64  --   --   --   BILITOT 0.6  --   --   --   ALBUMIN 3.8  --   --   --   TSH  --  2.682  --   --      ------------------------------------------------------------------------------------------------------------------ No results for input(s): "CHOL", "HDL", "LDLCALC", "TRIG", "CHOLHDL", "LDLDIRECT" in the last 72 hours.  No results found for: "HGBA1C" ------------------------------------------------------------------------------------------------------------------ No results for input(s): "TSH", "T4TOTAL", "T3FREE", "THYROIDAB" in the last 72 hours.  Invalid  input(s): "FREET3"   Cardiac Enzymes No results for input(s): "CKMB", "TROPONINI", "MYOGLOBIN" in the last 168 hours.  Invalid input(s): "CK" ------------------------------------------------------------------------------------------------------------------ No results found for: "BNP"  CBG: Recent Labs  Lab 07/30/22 2124  GLUCAP 119*     Recent Results (from the past 240 hour(s))  Resp Panel by RT-PCR (Flu A&B, Covid) Anterior Nasal Swab     Status: None   Collection Time: 07/30/22 10:05 PM   Specimen: Anterior Nasal Swab  Result Value Ref Range Status   SARS Coronavirus 2 by RT PCR NEGATIVE NEGATIVE Final    Comment: (NOTE) SARS-CoV-2 target nucleic acids are NOT DETECTED.  The SARS-CoV-2 RNA is generally detectable in upper respiratory specimens during the acute phase of infection. The lowest concentration of SARS-CoV-2 viral copies this assay can  detect is 138 copies/mL. A negative result does not preclude SARS-Cov-2 infection and should not be used as the sole basis for treatment or other patient management decisions. A negative result may occur with  improper specimen collection/handling, submission of specimen other than nasopharyngeal swab, presence of viral mutation(s) within the areas targeted by this assay, and inadequate number of viral copies(<138 copies/mL). A negative result must be combined with clinical observations, patient history, and epidemiological information. The expected result is Negative.  Fact Sheet for Patients:  EntrepreneurPulse.com.au  Fact Sheet for Healthcare Providers:  IncredibleEmployment.be  This test is no t yet approved or cleared by the [place] FDA and  has been authorized for detection and/or diagnosis of SARS-CoV-2 by FDA under an Emergency Use Authorization (EUA). This EUA will remain  in effect (meaning this test can be used) for the duration of the COVID-19 declaration under Section  564(b)(1) of the Act, 21 U.S.C.section 360bbb-3(b)(1), unless the authorization is terminated  or revoked sooner.       Influenza A by PCR NEGATIVE NEGATIVE Final   Influenza B by PCR NEGATIVE NEGATIVE Final    Comment: (NOTE) The Xpert Xpress SARS-CoV-2/FLU/RSV plus assay is intended as an aid in the diagnosis of influenza from Nasopharyngeal swab specimens and should not be used as a sole basis for treatment. Nasal washings and aspirates are unacceptable for Xpert Xpress SARS-CoV-2/FLU/RSV testing.  Fact Sheet for Patients: EntrepreneurPulse.com.au  Fact Sheet for Healthcare Providers: IncredibleEmployment.be  This test is not yet approved or cleared by the [place] FDA and has been authorized for detection and/or diagnosis of SARS-CoV-2 by FDA under an Emergency Use Authorization (EUA). This EUA will remain in effect (meaning this test can be used) for the duration of the COVID-19 declaration under Section 564(b)(1) of the Act, 21 U.S.C. section 360bbb-3(b)(1), unless the authorization is terminated or revoked.  Performed at [facility], [place] [address] [place], [place] [address]   Blood culture (routine x 2)     Status: None (Preliminary result)   Collection Time: 07/31/22  2:30 AM   Specimen: BLOOD RIGHT HAND  Result Value Ref Range Status   Specimen Description BLOOD RIGHT HAND  Final   Special Requests   Final    BOTTLES DRAWN AEROBIC AND ANAEROBIC Blood Culture results may not be optimal due to an inadequate volume of blood received in culture bottles   Culture   Final    NO GROWTH 2 DAYS Performed at [facility], [place] [address] [place], [place] [address]    Report Status PENDING  Incomplete  Blood culture (routine x 2)     Status: None (Preliminary result)   Collection Time: 07/31/22  3:00 AM   Specimen: BLOOD  Result Value Ref Range Status   Specimen Description BLOOD RIGHT ANTECUBITAL  Final   Special  Requests   Final    BOTTLES DRAWN AEROBIC AND ANAEROBIC Blood Culture adequate volume   Culture   Final    NO GROWTH 2 DAYS Performed at [facility], [place] [address] [place], [place] [address]    Report Status PENDING  Incomplete  Aerobic/Anaerobic Culture w Gram Stain (surgical/deep wound)     Status: None (Preliminary result)   Collection Time: 07/31/22  8:05 PM   Specimen: Soft Tissue, Other  Result Value Ref Range Status   Specimen Description WOUND  Final   Special Requests SWABS OF RIGHT CORONAL SUTURE MASS  Final   Gram Stain  Final    RARE WBC PRESENT, PREDOMINANTLY MONONUCLEAR NO ORGANISMS SEEN    Culture   Final    NO GROWTH 2 DAYS NO ANAEROBES ISOLATED; CULTURE IN PROGRESS FOR 5 DAYS Performed at [place] [address], [place], [place] [address]    Report Status PENDING  Incomplete  Aerobic/Anaerobic Culture w Gram Stain (surgical/deep wound)     Status: None (Preliminary result)   Collection Time: 07/31/22  8:16 PM   Specimen: Soft Tissue, Other  Result Value Ref Range Status   Specimen Description TISSUE  Final   Special Requests RT CORONAL SUTURE MASS  Final   Gram Stain NO WBC SEEN NO ORGANISMS SEEN   Final   Culture   Final    NO GROWTH 2 DAYS NO ANAEROBES ISOLATED; CULTURE IN PROGRESS FOR 5 DAYS Performed at [facility], [place] [address], [place], [place] [address]    Report Status PENDING  Incomplete  MRSA Next Gen by PCR, Nasal     Status: None   Collection Time: 08/01/22  2:15 AM   Specimen: Nasal Mucosa; Nasal Swab  Result Value Ref Range Status   MRSA by PCR Next Gen NOT DETECTED NOT DETECTED Final    Comment: (NOTE) The GeneXpert MRSA Assay (FDA approved for NASAL specimens only), is one component of a comprehensive MRSA colonization surveillance program. It is not intended to diagnose MRSA infection nor to guide or monitor treatment for MRSA infections. Test performance is not FDA approved in patients less than 40  years old. Performed at [facility], [place] [address], [place], [place] [address]   Culture, fungus without smear     Status: None (Preliminary result)   Collection Time: 08/01/22  9:16 AM   Specimen: Tissue; Other  Result Value Ref Range Status   Specimen Description TISSUE  Final   Special Requests NONE  Final   Culture   Final    NO FUNGUS ISOLATED AFTER 1 DAY Performed at [facility], [address] [place], [place] [address]    Report Status PENDING  Incomplete  Culture, fungus without smear     Status: None (Preliminary result)   Collection Time: 08/01/22  9:30 AM   Specimen: Tissue  Result Value Ref Range Status   Specimen Description TISSUE  Final   Special Requests NONE  Final   Culture   Final    NO FUNGUS ISOLATED AFTER 1 DAY Performed at [facility], [place] [address] [place], [place] [address]    Report Status PENDING  Incomplete     Radiology Studies: CT HEAD WO CONTRAST (5MM)  Result Date: 08/02/2022 CLINICAL DATA:  vision changes EXAM: CT HEAD WITHOUT CONTRAST TECHNIQUE: Contiguous axial images were obtained from the base of the skull through the vertex without intravenous contrast. RADIATION DOSE REDUCTION: This exam was performed according to the departmental dose-optimization program which includes automated exposure control, adjustment of the mA and/or kV according to patient size and/or use of iterative reconstruction technique. COMPARISON:  MRI July 31, 2022. FINDINGS: Brain: Postoperative changes of resection of lesion along the right frontoparietal suture with trace subjacent extra-axial hemorrhage. No significant mass effect. No evidence of acute large vascular territory infarct, midline shift, acute intraparenchymal hemorrhage, or hydrocephalus. Vascular: No hyperdense vessel identified. Skull: No acute fracture. 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  Sinuses/Orbits: Clear sinuses.  No acute orbital findings. Other: No mastoid  effusions. IMPRESSION: 1. Postoperative changes of resection of lesion along the right frontoparietal suture with trace subjacent extra-axial hemorrhage. No significant mass effect. 2. No evidence of acute intracranial abnormality. MRI could provide more sensitive evaluation if clinically warranted. 3. 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 Electronically Signed   By: Feliberto Harts M.D.   On: 08/02/2022 11:30     Carma Leaven DO [facility]  Between 7 am - 7 pm I am available, please contact me via Amion (for emergencies) or Securechat (non urgent messages)  Between 7 pm - 7 am I am not available, please contact night coverage MD/APP via Amion

## 2022-08-03 NOTE — Progress Notes (Signed)
Patient ID: Devin Barker, male   DOB: 10-May-1996, 26 y.o.   MRN: [id number] BP (!) 124/59 (BP Location: Left Arm)   Pulse 80   Temp 99 F (37.2 C) (Oral)   Resp 16   Ht 5\' 7"  (1.702 m)   Wt 122.5 kg   SpO2 98%   BMI 42.29 kg/m  There is no growth of any culture. ID anticipating long term antibiotics or antifungal. Await reports.  Normal neurologically

## 2022-08-03 NOTE — Progress Notes (Addendum)
Pharmacy Antibiotic Note- follow-up  Devin Barker is a 26 y.o. male admitted on 07/30/2022 with temporalis infection involving skull and dura.  Pharmacy has been consulted for vancomycin and cefepime dosing.  Pt also started on metronidazole.  Goal trough 15-20 mcg/mL Vanc trough came back at 14 tonight but it was drawn 4 hr late. Suspected true trough to be 18-20. Possible dc vanc in AM if culture remains neg.    Plan: Cont vanco 1750 mg IV every 8 hours Continue Cefepime 2g IV every 8 hours.  Height: 5\' 7"  (170.2 cm) Weight: 122.5 kg (270 lb) IBW/kg (Calculated) : 66.1  Temp (24hrs), Avg:98.5 F (36.9 C), Min:98.2 F (36.8 C), Max:99 F (37.2 C)  Recent Labs  Lab 07/30/22 2220 07/31/22 0020 07/31/22 0430 08/02/22 0853 08/02/22 0853 08/02/22 1401 08/03/22 0605 08/03/22 1803  WBC  --  11.8* 13.5* 13.9*  --   --  12.4*  --   CREATININE 0.83  --   --  0.61  --   --  0.56*  --   VANCOTROUGH  --   --   --  35*   < > 10*  --  14*   < > = values in this interval not displayed.     Estimated Creatinine Clearance: 177.1 mL/min (A) (by C-G formula based on SCr of 0.56 mg/dL (L)).    Allergies  Allergen Reactions   Other     Pt prefers medications that do not have gelatins. No capsules, gel caps etc....due to religious reasons  Pt also had a reaction to laughing gas (liquid form)     08/05/22, PharmD, BCIDP, AAHIVP, CPP Infectious Disease Pharmacist 08/03/2022 8:00 PM

## 2022-08-03 NOTE — Progress Notes (Signed)
[facility]  Date of Admission:  07/30/2022   Total days of inpatient antibiotics 3  Principal Problem:   Intracranial abscess Active Problems:   Polysubstance abuse (HCC)   Thrombocytosis   Nausea and vomiting   Proptosis   Status post surgery          Assessment:  [name] admitted with skull mass/abscess. 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 Report the mass has been there for a couple months with HA. He noted the mass after a fall. NSY engages.  MRI showed peripherally enhancing collection along right frontal lobe c.f abscess, with neoplasm in the differential. -RPR negative. HIV negative, Urine GC negative -Noted right temporal blurry vision this AM, will get imaging. NSY on board.  Recommendations: -Continue vanc+ cefepime+metronidazole(If Cx negative tomorrow will stop vanc) -Follow OR Cx(bacterial/fungal AFB)  I spoke with Dr. Luisa Hart in regards to path, specimen seems compatible with abscess. AFB/fungal stain pinding(should result by tomorrow. I discussed with pt that he will llikely need for prolonged IV therapy, not a home PICC line candidate.  -Fungitell/ Aspergillus pending   Microbiology:   Antibiotics: Vnac+ cefepime, metro 11/12- Cultures: Blood 11/12p  OR 11/13p   SUBJECTIVE: No new complaints. Resting in bed.  Interval: Afebrile overnight, wbc12.4k  Review of Systems: Review of Systems  All other systems reviewed and are negative.    Scheduled Meds:  nicotine  7 mg Transdermal Daily   Continuous Infusions:  0.9 % NaCl with KCl 20 mEq / L 80 mL/hr at 08/03/22 0603   ceFEPime (MAXIPIME) IV 2 g (08/03/22 1336)   metronidazole 500 mg (08/03/22 0945)   vancomycin 1,750 mg (08/03/22 0803)   PRN Meds:.acetaminophen **OR** acetaminophen, guaiFENesin-dextromethorphan, HYDROcodone-acetaminophen, labetalol,  menthol-cetylpyridinium, naLOXone (NARCAN)  injection, ondansetron **OR** ondansetron (ZOFRAN) IV, mouth rinse, promethazine Allergies  Allergen Reactions   Other     Pt prefers medications that do not have gelatins. No capsules, gel caps etc....due to religious reasons  Pt also had a reaction to laughing gas (liquid form)     OBJECTIVE: Vitals:   08/02/22 2342 08/03/22 0359 08/03/22 0816 08/03/22 1117  BP: (!) 119/59 (!) 141/53 119/72 (!) 123/54  Pulse: 61 63 89 81  Resp: 19 17 20 16   Temp: 98.5 F (36.9 C) 98.5 F (36.9 C) 98.2 F (36.8 C) 98.3 F (36.8 C)  TempSrc:   Oral Oral  SpO2: 99% 96% 97% 98%  Weight:      Height:       Body mass index is 42.29 kg/m.  Physical Exam Constitutional:      General: He is not in acute distress.    Appearance: He is normal weight. He is not toxic-appearing.  HENT:     Head: Normocephalic and atraumatic.     Comments: Right post op scar    Right Ear: External ear normal.     Left Ear: External ear normal.     Nose: No congestion or rhinorrhea.     Mouth/Throat:     Mouth: Mucous membranes are moist.     Pharynx: Oropharynx is clear.  Eyes:     Extraocular Movements: Extraocular movements intact.     Conjunctiva/sclera: Conjunctivae normal.     Pupils: Pupils are equal, round, and reactive to light.  Cardiovascular:     Rate and Rhythm: Normal rate and regular rhythm.  Heart sounds: No murmur heard.    No friction rub. No gallop.  Pulmonary:     Effort: Pulmonary effort is normal.     Breath sounds: Normal breath sounds.  Abdominal:     General: Abdomen is flat. Bowel sounds are normal.     Palpations: Abdomen is soft.  Musculoskeletal:        General: No swelling. Normal range of motion.     Cervical back: Normal range of motion and neck supple.  Skin:    General: Skin is warm and dry.  Neurological:     General: No focal deficit present.     Mental Status: He is oriented to person, place, and time.  Psychiatric:         Mood and Affect: Mood normal.       Lab Results Lab Results  Component Value Date   WBC 12.4 (H) 08/03/2022   HGB 13.7 08/03/2022   HCT 43.5 08/03/2022   MCV 84.0 08/03/2022   PLT 521 (H) 08/03/2022    Lab Results  Component Value Date   CREATININE 0.56 (L) 08/03/2022   BUN <5 (L) 08/03/2022   NA 134 (L) 08/03/2022   K 3.9 08/03/2022   CL 104 08/03/2022   CO2 25 08/03/2022    Lab Results  Component Value Date   ALT 22 07/30/2022   AST 18 07/30/2022   ALKPHOS 64 07/30/2022   BILITOT 0.6 07/30/2022        Danelle Earthly, MD [facility] 08/03/2022, 2:17 PM

## 2022-08-04 LAB — ACID FAST SMEAR (AFB, MYCOBACTERIA)
Acid Fast Smear: NEGATIVE
Acid Fast Smear: NEGATIVE

## 2022-08-04 MED ORDER — MAGNESIUM CITRATE PO SOLN: 1.0000 | Freq: Once | ORAL | Status: DC | PRN

## 2022-08-04 MED ORDER — PANTOPRAZOLE SODIUM 40 MG PO TBEC
40.0000 mg | DELAYED_RELEASE_TABLET | Freq: Every day | ORAL | Status: DC
  Administered 2022-08-04 – 2022-08-22 (×14): 40 mg via ORAL
  Filled 2022-08-04 (×18): qty 1

## 2022-08-04 MED ORDER — IBUPROFEN 200 MG PO TABS
600.0000 mg | ORAL_TABLET | ORAL | Status: DC | PRN
  Administered 2022-08-04 – 2022-08-19 (×8): 600 mg via ORAL
  Filled 2022-08-04 (×9): qty 3

## 2022-08-04 MED ORDER — SENNOSIDES-DOCUSATE SODIUM 8.6-50 MG PO TABS
1.0000 | ORAL_TABLET | Freq: Two times a day (BID) | ORAL | Status: DC
  Administered 2022-08-04 – 2022-08-22 (×21): 1 via ORAL
  Filled 2022-08-04 (×31): qty 1

## 2022-08-04 MED ORDER — BISACODYL 5 MG PO TBEC: 5.0000 mg | DELAYED_RELEASE_TABLET | Freq: Every day | ORAL | Status: DC | PRN

## 2022-08-04 MED ORDER — POLYETHYLENE GLYCOL 3350 17 G PO PACK
17.0000 g | PACK | Freq: Every day | ORAL | Status: DC | PRN
  Filled 2022-08-04: qty 1

## 2022-08-04 MED ORDER — ALUM & MAG HYDROXIDE-SIMETH 200-200-20 MG/5ML PO SUSP
30.0000 mL | Freq: Once | ORAL | Status: AC
  Administered 2022-08-04: 30 mL via ORAL
  Filled 2022-08-04: qty 30

## 2022-08-04 NOTE — TOC Progression Note (Signed)
Transition of Care [facility]) - Progression Note    Patient Details  Name: Devin Barker MRN: [id number] Date of Birth: October 20, 1995  [facility]) CM/SW Contact  Kermit Balo, RN Phone Number: 08/04/2022, 11:20 AM  Clinical Narrative:    Plan is for IV abx at the hospital. Pt not a candidate for home IV abx.  TOC following.   Expected Discharge Plan: Home/Self Care Barriers to Discharge: Continued Medical Work up  Expected Discharge Plan and Services Expected Discharge Plan: Home/Self Care   Discharge Planning Services: CM Consult   Living arrangements for the past 2 months: Single Family Home                                       Social Determinants of Health (SDOH) Interventions    Readmission Risk Interventions     No data to display

## 2022-08-04 NOTE — Progress Notes (Signed)
PROGRESS NOTE  Nolon Hanneman RCB:[id number] DOB: [birth year] DOA: 07/30/2022 PCP: Patient, No Pcp Per   LOS: 4 days   Brief Narrative / Interim history: 26 year old male without significant past medical history who comes into the hospital with right-sided headache and vomiting.  Imaging in the emergency room shows soft tissue mass, skull defect with some erosion and dural enhancement, concerning for abscess.  Patient's urine drug screen was positive for opioids, cocaine, amphetamines, THC.  He admits to shooting cocaine.   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 Franky Macho consulted, patient was taken to the OR on 11/13 status post right craniectomy for resection.  Operative note mention that purulent material was expressed -ID consulted, continue broad-spectrum antibiotics: vancomycin and cefepime -Gram stain was negative but cultures are still pending -AFB and fungal stain pending   Intractable headache/vision changes -Stat CT head ordered, MRI pending - awaiting neurosurgical evaluation -R lateral hemianopsia(blurred more than truly lost vision) with 'stabbing' headache behind his eye.  Polysubstance abuse-UDS positive for opiates, cocaine, amphetamines, THC.  Patient would like the treatment team to refrain in discussing his polysubstance use with his family  Nausea, vomiting-improved compared to admission   Chronic thrombocytosis -Continue to follow, stable near baseline  Scheduled Meds:  nicotine  7 mg Transdermal Daily   senna-docusate  1  tablet Oral BID   Continuous Infusions:  0.9 % NaCl with KCl 20 mEq / L 80 mL/hr at 08/04/22 1147   ceFEPime (MAXIPIME) IV 2 g (08/04/22 1218)   metronidazole 500 mg (08/04/22 0952)   PRN Meds:.acetaminophen **OR** [DISCONTINUED] acetaminophen, guaiFENesin-dextromethorphan, HYDROcodone-acetaminophen, ibuprofen, labetalol, menthol-cetylpyridinium, naLOXone (NARCAN)  injection, ondansetron **OR** ondansetron (ZOFRAN) IV, mouth rinse, promethazine  No current outpatient medications  Diet Orders (From admission, onward)     Start     Ordered   08/01/22 0245  Diet regular Room service appropriate? Yes; Fluid consistency: Thin  Diet effective now       Question Answer Comment  Room service appropriate? Yes   Fluid consistency: Thin      08/01/22 0244            DVT prophylaxis: SCDs Start: 07/31/22 2250 SCDs Start: 07/31/22 0328   Lab Results  Component Value Date   PLT 521 (H) 08/03/2022      Code Status: Full Code  Family Communication: None present  Status is: Inpatient Remains inpatient appropriate because: severity of illness; need for IV antibiotics Level of care: MedSurg  Consultants:  Neurosurgery   Objective: Vitals:   08/03/22 2324 08/04/22 0324 08/04/22 0746 08/04/22 1139  BP: (!) 131/59 138/65 115/67 (!) 101/52  Pulse: 74 66 73 76  Resp: 16 16 17 17   Temp: 98.4 F (36.9 C) 98.8 F (37.1 C) 98.8 F (37.1 C) 98.7 F (37.1 C)  TempSrc: Oral Oral Oral Oral  SpO2: 99% 97% 96% 98%  Weight:      Height:        Intake/Output Summary (Last 24 hours) at 08/04/2022 1236 Last data filed at 08/04/2022 0421 Gross per 24 hour  Intake 3558.66 ml  Output 1400 ml  Net 2158.66 ml  Wt Readings from Last 3 Encounters:  07/31/22 122.5 kg  05/01/22 122.5 kg  12/08/16 113.4 kg (>99 %, Z= 2.38)*   * Growth percentiles are based on CDC (Boys, 2-20 Years) data.    Examination:  Constitutional: NAD HEENMT: Mucous membranes are moist. Moderate edema R  temporal area with post op incision clean/dry/intact Neck: normal, supple Respiratory: clear to auscultation bilaterally, no wheezing, no crackles. Normal respiratory effort. No accessory muscle use.  Cardiovascular: Regular rate and rhythm, no murmurs / rubs / gallops. No LE edema.  Abdomen: non distended, no tenderness. Bowel sounds positive.  Musculoskeletal: no clubbing / cyanosis.  Neurologic: Without overt deficits   Data Reviewed: I have independently reviewed following labs and imaging studies   CBC Recent Labs  Lab 07/31/22 0020 07/31/22 0430 08/02/22 0853 08/03/22 0605  WBC 11.8* 13.5* 13.9* 12.4*  HGB 15.4 14.5 13.4 13.7  HCT 49.1 46.6 41.7 43.5  PLT 617* 555* 511* 521*  MCV 84.1 85.7 82.2 84.0  MCH 26.4 26.7 26.4 26.4  MCHC 31.4 31.1 32.1 31.5  RDW 14.5 14.6 14.4 14.5  LYMPHSABS 2.8  --   --   --   MONOABS 0.5  --   --   --   EOSABS 0.5  --   --   --   BASOSABS 0.1  --   --   --      Recent Labs  Lab 07/30/22 2220 07/31/22 0430 08/02/22 0853 08/03/22 0605  NA 138  --  136 134*  K 4.3  --  3.5 3.9  CL 101  --  107 104  CO2 26  --  22 25  GLUCOSE 111*  --  138* 91  BUN 6  --  6 <5*  CREATININE 0.83  --  0.61 0.56*  CALCIUM 9.5  --  8.3* 8.3*  AST 18  --   --   --   ALT 22  --   --   --   ALKPHOS 64  --   --   --   BILITOT 0.6  --   --   --   ALBUMIN 3.8  --   --   --   TSH  --  2.682  --   --      ------------------------------------------------------------------------------------------------------------------ No results for input(s): "CHOL", "HDL", "LDLCALC", "TRIG", "CHOLHDL", "LDLDIRECT" in the last 72 hours.  No results found for: "HGBA1C" ------------------------------------------------------------------------------------------------------------------ No results for input(s): "TSH", "T4TOTAL", "T3FREE", "THYROIDAB" in the last 72 hours.  Invalid input(s): "FREET3"   Cardiac Enzymes No results for input(s): "CKMB", "TROPONINI",  "MYOGLOBIN" in the last 168 hours.  Invalid input(s): "CK" ------------------------------------------------------------------------------------------------------------------ No results found for: "BNP"  CBG: Recent Labs  Lab 07/30/22 2124  GLUCAP 119*     Recent Results (from the past 240 hour(s))  Resp Panel by RT-PCR (Flu A&B, Covid) Anterior Nasal Swab     Status: None   Collection Time: 07/30/22 10:05 PM   Specimen: Anterior Nasal Swab  Result Value Ref Range Status   SARS Coronavirus 2 by RT PCR NEGATIVE NEGATIVE Final    Comment: (NOTE) SARS-CoV-2 target nucleic acids are NOT DETECTED.  The SARS-CoV-2 RNA is generally detectable in upper respiratory specimens during the acute phase of infection. The lowest concentration of SARS-CoV-2 viral copies this assay can detect is 138 copies/mL. A negative result does not preclude SARS-Cov-2 infection and should not be used as the sole basis for treatment or other patient management decisions. A negative result may occur  with  improper specimen collection/handling, submission of specimen other than nasopharyngeal swab, presence of viral mutation(s) within the areas targeted by this assay, and inadequate number of viral copies(<138 copies/mL). A negative result must be combined with clinical observations, patient history, and epidemiological information. The expected result is Negative.  Fact Sheet for Patients:  EntrepreneurPulse.com.au  Fact Sheet for Healthcare Providers:  IncredibleEmployment.be  This test is no t yet approved or cleared by the [place] FDA and  has been authorized for detection and/or diagnosis of SARS-CoV-2 by FDA under an Emergency Use Authorization (EUA). This EUA will remain  in effect (meaning this test can be used) for the duration of the COVID-19 declaration under Section 564(b)(1) of the Act, 21 U.S.C.section 360bbb-3(b)(1), unless the authorization is  terminated  or revoked sooner.       Influenza A by PCR NEGATIVE NEGATIVE Final   Influenza B by PCR NEGATIVE NEGATIVE Final    Comment: (NOTE) The Xpert Xpress SARS-CoV-2/FLU/RSV plus assay is intended as an aid in the diagnosis of influenza from Nasopharyngeal swab specimens and should not be used as a sole basis for treatment. Nasal washings and aspirates are unacceptable for Xpert Xpress SARS-CoV-2/FLU/RSV testing.  Fact Sheet for Patients: EntrepreneurPulse.com.au  Fact Sheet for Healthcare Providers: IncredibleEmployment.be  This test is not yet approved or cleared by the [place] FDA and has been authorized for detection and/or diagnosis of SARS-CoV-2 by FDA under an Emergency Use Authorization (EUA). This EUA will remain in effect (meaning this test can be used) for the duration of the COVID-19 declaration under Section 564(b)(1) of the Act, 21 U.S.C. section 360bbb-3(b)(1), unless the authorization is terminated or revoked.  Performed at [facility], [place] [address] [place], [place] [address]   Blood culture (routine x 2)     Status: None (Preliminary result)   Collection Time: 07/31/22  2:30 AM   Specimen: BLOOD RIGHT HAND  Result Value Ref Range Status   Specimen Description BLOOD RIGHT HAND  Final   Special Requests   Final    BOTTLES DRAWN AEROBIC AND ANAEROBIC Blood Culture results may not be optimal due to an inadequate volume of blood received in culture bottles   Culture   Final    NO GROWTH 4 DAYS Performed at [facility], [place] [address] [place], [place] [address]    Report Status PENDING  Incomplete  Blood culture (routine x 2)     Status: None (Preliminary result)   Collection Time: 07/31/22  3:00 AM   Specimen: BLOOD  Result Value Ref Range Status   Specimen Description BLOOD RIGHT ANTECUBITAL  Final   Special Requests   Final    BOTTLES DRAWN AEROBIC AND ANAEROBIC Blood Culture adequate  volume   Culture   Final    NO GROWTH 4 DAYS Performed at [facility], [place] [address], [place], [place] [address]    Report Status PENDING  Incomplete  Aerobic/Anaerobic Culture w Gram Stain (surgical/deep wound)     Status: None (Preliminary result)   Collection Time: 07/31/22  8:05 PM   Specimen: Soft Tissue, Other  Result Value Ref Range Status   Specimen Description WOUND  Final   Special Requests SWABS OF RIGHT CORONAL SUTURE MASS  Final   Gram Stain   Final    RARE WBC PRESENT, PREDOMINANTLY MONONUCLEAR NO ORGANISMS SEEN    Culture   Final    NO GROWTH 4 DAYS NO ANAEROBES ISOLATED; CULTURE IN PROGRESS  FOR 5 DAYS Performed at [facility], [place] [address] [place], [place] [address]    Report Status PENDING  Incomplete  Aerobic/Anaerobic Culture w Gram Stain (surgical/deep wound)     Status: None (Preliminary result)   Collection Time: 07/31/22  8:16 PM   Specimen: Soft Tissue, Other  Result Value Ref Range Status   Specimen Description TISSUE  Final   Special Requests RT CORONAL SUTURE MASS  Final   Gram Stain NO WBC SEEN NO ORGANISMS SEEN   Final   Culture   Final    NO GROWTH 4 DAYS NO ANAEROBES ISOLATED; CULTURE IN PROGRESS FOR 5 DAYS Performed at [facility], [place] [address] [place], [place] [address]    Report Status PENDING  Incomplete  MRSA Next Gen by PCR, Nasal     Status: None   Collection Time: 08/01/22  2:15 AM   Specimen: Nasal Mucosa; Nasal Swab  Result Value Ref Range Status   MRSA by PCR Next Gen NOT DETECTED NOT DETECTED Final    Comment: (NOTE) The GeneXpert MRSA Assay (FDA approved for NASAL specimens only), is one component of a comprehensive MRSA colonization surveillance program. It is not intended to diagnose MRSA infection nor to guide or monitor treatment for MRSA infections. Test performance is not FDA approved in patients less than 91 years old. Performed at [facility], [place] [address] [place],  [place] [address]   Culture, fungus without smear     Status: None (Preliminary result)   Collection Time: 08/01/22  9:16 AM   Specimen: Tissue; Other  Result Value Ref Range Status   Specimen Description TISSUE  Final   Special Requests NONE  Final   Culture   Final    NO FUNGUS ISOLATED AFTER 2 DAYS Performed at [facility], [address] [place], [place] [address]    Report Status PENDING  Incomplete  Culture, fungus without smear     Status: None (Preliminary result)   Collection Time: 08/01/22  9:30 AM   Specimen: Tissue  Result Value Ref Range Status   Specimen Description TISSUE  Final   Special Requests NONE  Final   Culture   Final    NO FUNGUS ISOLATED AFTER 2 DAYS Performed at [facility], [place] [address], [place], [place] [address]    Report Status PENDING  Incomplete     Radiology Studies: No results found.   Holli Humbles DO [facility]  Between 7 am - 7 pm I am available, please contact me via Amion (for emergencies) or Securechat (non urgent messages)  Between 7 pm - 7 am I am not available, please contact night coverage MD/APP via Amion

## 2022-08-05 LAB — AEROBIC/ANAEROBIC CULTURE W GRAM STAIN (SURGICAL/DEEP WOUND): Gram Stain: NONE SEEN

## 2022-08-05 LAB — BASIC METABOLIC PANEL
Anion gap: 7 (ref 5–15)
BUN: <5 mg/dL — ABNORMAL LOW (ref 6–20)
CO2: 25 mmol/L (ref 22–32)
Calcium: 8.6 mg/dL — ABNORMAL LOW (ref 8.9–10.3)
Chloride: 106 mmol/L (ref 98–111)
Creatinine, Ser: 0.61 mg/dL (ref 0.61–1.24)
GFR, Estimated: >60 mL/min (ref >60)
Glucose, Bld: 101 mg/dL — ABNORMAL HIGH (ref 70–99)
Potassium: 3.9 mmol/L (ref 3.5–5.1)
Sodium: 138 mmol/L (ref 135–145)

## 2022-08-05 LAB — CULTURE, BLOOD (ROUTINE X 2)
Culture: NO GROWTH
Culture: NO GROWTH
Special Requests: ADEQUATE

## 2022-08-05 LAB — CBC
HCT: 43.4 % (ref 39.0–52.0)
Hemoglobin: 14 g/dL (ref 13.0–17.0)
MCH: 26.7 pg (ref 26.0–34.0)
MCHC: 32.3 g/dL (ref 30.0–36.0)
MCV: 82.7 fL (ref 80.0–100.0)
Platelets: 547 10*3/uL — ABNORMAL HIGH (ref 150–400)
RBC: 5.25 MIL/uL (ref 4.22–5.81)
RDW: 14.4 % (ref 11.5–15.5)
WBC: 12.9 10*3/uL — ABNORMAL HIGH (ref 4.0–10.5)
nRBC: 0 % (ref 0.0–0.2)

## 2022-08-05 LAB — MISC LABCORP TEST (SEND OUT): Labcorp test code: 832599

## 2022-08-05 LAB — ASPERGILLUS ANTIGEN, BAL/SERUM: Aspergillus Ag, BAL/Serum: 0.05 Index (ref 0.00–0.49)

## 2022-08-05 NOTE — Progress Notes (Signed)
Patient progressing well.  No new issues or problems.  Remains on IV antibiotics.  Patient felt to be unable to be safely discharged with long-term IV access secondary to history of IV drug abuse.  We will keep in hospital for now.

## 2022-08-05 NOTE — Progress Notes (Signed)
PROGRESS NOTE  Devin Barker [id number] DOB: [birth year] DOA: 07/30/2022 PCP: Patient, No Pcp Per   LOS: 5 days   Brief Narrative / Interim history: 26 year old male without significant past medical history who comes into the hospital with right-sided headache and vomiting.  Imaging in the emergency room shows soft tissue mass, skull defect with some erosion and dural enhancement, concerning for abscess.  Patient's urine drug screen was positive for opioids, cocaine, amphetamines, THC.  He admits to shooting cocaine.   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 Christella Noa consulted, patient was taken to the OR on 11/13 status post right craniectomy for resection.  Operative note mention that purulent material was expressed -ID consulted, continue broad-spectrum antibiotics: cefepime (vancomycin discontinued 17th given negative MRSA) -Gram stain was negative but cultures are still pending -AFB and fungal stain pending   Intractable headache/vision changes -Stat CT head ordered, MRI pending - awaiting neurosurgical evaluation -R lateral hemianopsia(blurred more than truly lost vision) with 'stabbing' headache behind his eye.  Polysubstance abuse Pain-seeking behavior -UDS positive for opiates, cocaine, amphetamines, THC.  -Patient continues to complaint of transient pain in the eye, chest, leg, and arm - these are fleeting events lasting maybe seconds to a minute. No IV narcotics to be given. Patient would like the treatment team to refrain in discussing  his polysubstance use with his family  Nausea, vomiting-Resolved  Chronic thrombocytosis -Continue to follow, stable near baseline  Tobacco abuse Continues to ask when he can start smoking again - we discussed ideally that would be never. Offered nicotine replacement  Scheduled Meds:  nicotine  7 mg Transdermal Daily   pantoprazole  40 mg Oral Daily   senna-docusate  1 tablet Oral BID   Continuous Infusions:  0.9 % NaCl with KCl 20 mEq / L 80 mL/hr at 08/04/22 1147   ceFEPime (MAXIPIME) IV 2 g (08/05/22 0423)   metronidazole 500 mg (08/05/22 0418)   PRN Meds:.acetaminophen **OR** [DISCONTINUED] acetaminophen, bisacodyl, guaiFENesin-dextromethorphan, HYDROcodone-acetaminophen, ibuprofen, labetalol, magnesium citrate, menthol-cetylpyridinium, naLOXone (NARCAN)  injection, ondansetron **OR** ondansetron (ZOFRAN) IV, mouth rinse, polyethylene glycol, promethazine  No current outpatient medications  Diet Orders (From admission, onward)     Start     Ordered   08/01/22 0245  Diet regular Room service appropriate? Yes; Fluid consistency: Thin  Diet effective now       Question Answer Comment  Room service appropriate? Yes   Fluid consistency: Thin      08/01/22 0244            DVT prophylaxis: SCDs Start: 07/31/22 2250 SCDs Start: 07/31/22 0328   Lab Results  Component Value Date   PLT 547 (H) 08/05/2022      Code Status: Full Code  Family Communication: None present  Status is: Inpatient Remains inpatient appropriate because: severity of illness; need for IV antibiotics and unsafe to discharge with PICC due to IV drug abuse  Level of care: MedSurg  Consultants:  Neurosurgery   Objective: Vitals:   08/04/22 1741 08/04/22 1951 08/04/22 2356 08/05/22 0414  BP: 129/68 121/77 (!) 111/46 111/77  Pulse: (!) 52 (!) 56 Marland Kitchen)  56 81  Resp:  20 20 20   Temp:  (!) 97.4 F (36.3 C) (!) 97.5 F (36.4 C) 98.3 F (36.8 C)  TempSrc:  Oral Oral Oral  SpO2:  90% 95% 98%   Weight:      Height:       No intake or output data in the 24 hours ending 08/05/22 0740  Wt Readings from Last 3 Encounters:  07/31/22 122.5 kg  05/01/22 122.5 kg  12/08/16 113.4 kg (>99 %, Z= 2.38)*   * Growth percentiles are based on CDC (Boys, 2-20 Years) data.    Examination:  Constitutional: NAD HEENMT: Mucous membranes are moist. Moderate edema R temporal area with post op incision clean/dry/intact Neck: normal, supple Respiratory: clear to auscultation bilaterally, no wheezing, no crackles. Normal respiratory effort. No accessory muscle use.  Cardiovascular: Regular rate and rhythm, no murmurs / rubs / gallops. No LE edema.  Abdomen: non distended, no tenderness. Bowel sounds positive.  Musculoskeletal: no clubbing / cyanosis.  Neurologic: Without overt deficits   Data Reviewed: I have independently reviewed following labs and imaging studies   CBC Recent Labs  Lab 07/31/22 0020 07/31/22 0430 08/02/22 0853 08/03/22 0605 08/05/22 0440  WBC 11.8* 13.5* 13.9* 12.4* 12.9*  HGB 15.4 14.5 13.4 13.7 14.0  HCT 49.1 46.6 41.7 43.5 43.4  PLT 617* 555* 511* 521* 547*  MCV 84.1 85.7 82.2 84.0 82.7  MCH 26.4 26.7 26.4 26.4 26.7  MCHC 31.4 31.1 32.1 31.5 32.3  RDW 14.5 14.6 14.4 14.5 14.4  LYMPHSABS 2.8  --   --   --   --   MONOABS 0.5  --   --   --   --   EOSABS 0.5  --   --   --   --   BASOSABS 0.1  --   --   --   --      Recent Labs  Lab 07/30/22 2220 07/31/22 0430 08/02/22 0853 08/03/22 0605 08/05/22 0440  NA 138  --  136 134* 138  K 4.3  --  3.5 3.9 3.9  CL 101  --  107 104 106  CO2 26  --  22 25 25   GLUCOSE 111*  --  138* 91 101*  BUN 6  --  6 <5* <5*  CREATININE 0.83  --  0.61 0.56* 0.61  CALCIUM 9.5  --  8.3* 8.3* 8.6*  AST 18  --   --   --   --   ALT 22  --   --   --   --   ALKPHOS 64  --   --   --   --   BILITOT 0.6  --   --   --   --   ALBUMIN 3.8  --   --   --   --   TSH  --  2.682  --   --   --       ------------------------------------------------------------------------------------------------------------------ No results for input(s): "CHOL", "HDL", "LDLCALC", "TRIG", "CHOLHDL", "LDLDIRECT" in the last 72 hours.  No results found for: "HGBA1C" ------------------------------------------------------------------------------------------------------------------ No results for input(s): "TSH", "T4TOTAL", "T3FREE", "THYROIDAB" in the last 72 hours.  Invalid input(s): "FREET3"   Cardiac Enzymes No results for input(s): "CKMB", "TROPONINI", "MYOGLOBIN" in the last 168 hours.  Invalid input(s): "CK" ------------------------------------------------------------------------------------------------------------------ No results found for: "BNP"  CBG: Recent Labs  Lab 07/30/22 2124  GLUCAP 119*     Recent Results (from the past 240 hour(s))  Resp Panel by RT-PCR (  Flu A&B, Covid) Anterior Nasal Swab     Status: None   Collection Time: 07/30/22 10:05 PM   Specimen: Anterior Nasal Swab  Result Value Ref Range Status   SARS Coronavirus 2 by RT PCR NEGATIVE NEGATIVE Final    Comment: (NOTE) SARS-CoV-2 target nucleic acids are NOT DETECTED.  The SARS-CoV-2 RNA is generally detectable in upper respiratory specimens during the acute phase of infection. The lowest concentration of SARS-CoV-2 viral copies this assay can detect is 138 copies/mL. A negative result does not preclude SARS-Cov-2 infection and should not be used as the sole basis for treatment or other patient management decisions. A negative result may occur with  improper specimen collection/handling, submission of specimen other than nasopharyngeal swab, presence of viral mutation(s) within the areas targeted by this assay, and inadequate number of viral copies(<138 copies/mL). A negative result must be combined with clinical observations, patient history, and epidemiological information. The expected result is  Negative.  Fact Sheet for Patients:  EntrepreneurPulse.com.au  Fact Sheet for Healthcare Providers:  IncredibleEmployment.be  This test is no t yet approved or cleared by the [place] FDA and  has been authorized for detection and/or diagnosis of SARS-CoV-2 by FDA under an Emergency Use Authorization (EUA). This EUA will remain  in effect (meaning this test can be used) for the duration of the COVID-19 declaration under Section 564(b)(1) of the Act, 21 U.S.C.section 360bbb-3(b)(1), unless the authorization is terminated  or revoked sooner.       Influenza A by PCR NEGATIVE NEGATIVE Final   Influenza B by PCR NEGATIVE NEGATIVE Final    Comment: (NOTE) The Xpert Xpress SARS-CoV-2/FLU/RSV plus assay is intended as an aid in the diagnosis of influenza from Nasopharyngeal swab specimens and should not be used as a sole basis for treatment. Nasal washings and aspirates are unacceptable for Xpert Xpress SARS-CoV-2/FLU/RSV testing.  Fact Sheet for Patients: EntrepreneurPulse.com.au  Fact Sheet for Healthcare Providers: IncredibleEmployment.be  This test is not yet approved or cleared by the [place] FDA and has been authorized for detection and/or diagnosis of SARS-CoV-2 by FDA under an Emergency Use Authorization (EUA). This EUA will remain in effect (meaning this test can be used) for the duration of the COVID-19 declaration under Section 564(b)(1) of the Act, 21 U.S.C. section 360bbb-3(b)(1), unless the authorization is terminated or revoked.  Performed at [facility], [place] [address], [place], [place] [address]   Blood culture (routine x 2)     Status: None   Collection Time: 07/31/22  2:30 AM   Specimen: BLOOD RIGHT HAND  Result Value Ref Range Status   Specimen Description BLOOD RIGHT HAND  Final   Special Requests   Final    BOTTLES DRAWN AEROBIC AND ANAEROBIC Blood Culture results  may not be optimal due to an inadequate volume of blood received in culture bottles   Culture   Final    NO GROWTH 5 DAYS Performed at [facility], [place] [address] [place], [place] [address]    Report Status 08/05/2022 FINAL  Final  Blood culture (routine x 2)     Status: None   Collection Time: 07/31/22  3:00 AM   Specimen: BLOOD  Result Value Ref Range Status   Specimen Description BLOOD RIGHT ANTECUBITAL  Final   Special Requests   Final    BOTTLES DRAWN AEROBIC AND ANAEROBIC Blood Culture adequate volume   Culture   Final    NO GROWTH 5 DAYS Performed at [facility]  [facility], [address] [place], [place] [address]    Report Status 08/05/2022 FINAL  Final  Aerobic/Anaerobic Culture w Gram Stain (surgical/deep wound)     Status: None (Preliminary result)   Collection Time: 07/31/22  8:05 PM   Specimen: Soft Tissue, Other  Result Value Ref Range Status   Specimen Description WOUND  Final   Special Requests SWABS OF RIGHT CORONAL SUTURE MASS  Final   Gram Stain   Final    RARE WBC PRESENT, PREDOMINANTLY MONONUCLEAR NO ORGANISMS SEEN    Culture   Final    NO GROWTH 4 DAYS NO ANAEROBES ISOLATED; CULTURE IN PROGRESS FOR 5 DAYS Performed at [facility], [address], [place], [place] [address]    Report Status PENDING  Incomplete  Aerobic/Anaerobic Culture w Gram Stain (surgical/deep wound)     Status: None (Preliminary result)   Collection Time: 07/31/22  8:16 PM   Specimen: Soft Tissue, Other  Result Value Ref Range Status   Specimen Description TISSUE  Final   Special Requests RT CORONAL SUTURE MASS  Final   Gram Stain NO WBC SEEN NO ORGANISMS SEEN   Final   Culture   Final    NO GROWTH 4 DAYS NO ANAEROBES ISOLATED; CULTURE IN PROGRESS FOR 5 DAYS Performed at [facility], [address], [place], [place] [address]    Report Status PENDING  Incomplete  MRSA Next Gen by PCR, Nasal     Status: None   Collection Time: 08/01/22  2:15 AM   Specimen:  Nasal Mucosa; Nasal Swab  Result Value Ref Range Status   MRSA by PCR Next Gen NOT DETECTED NOT DETECTED Final    Comment: (NOTE) The GeneXpert MRSA Assay (FDA approved for NASAL specimens only), is one component of a comprehensive MRSA colonization surveillance program. It is not intended to diagnose MRSA infection nor to guide or monitor treatment for MRSA infections. Test performance is not FDA approved in patients less than 21 years old. Performed at [facility], [address] [place], [place] [address]   Acid Fast Smear (AFB)     Status: None   Collection Time: 08/01/22  9:01 AM   Specimen: Tissue  Result Value Ref Range Status   AFB Specimen Processing Concentration  Final   Acid Fast Smear Negative  Final    Comment: (NOTE) Performed At: [facility] [address] [place], [place] [id number] Jolene Schimke MD [id number]    Source (AFB) TISSUE  Final    Comment: Performed at [facility], [address] [place], [place] [address]  Acid Fast Smear (AFB)     Status: None   Collection Time: 08/01/22  9:16 AM   Specimen: Tissue  Result Value Ref Range Status   AFB Specimen Processing Concentration  Final   Acid Fast Smear Negative  Final    Comment: (NOTE) Performed At: [facility] [address] [place], [place] [id number] Jolene Schimke MD [id number]    Source (AFB) TISSUE  Final    Comment: Performed at [facility], [address], [place], [place] [address]  Culture, fungus without smear     Status: None (Preliminary result)   Collection Time: 08/01/22  9:16 AM   Specimen: Tissue; Other  Result Value Ref Range Status   Specimen Description TISSUE  Final   Special Requests NONE  Final   Culture   Final    NO FUNGUS ISOLATED AFTER  3 DAYS Performed at [facility], [place] [address] [place], [place] [address]    Report Status PENDING  Incomplete  Culture, fungus without smear     Status: None (Preliminary result)    Collection Time: 08/01/22  9:30 AM   Specimen: Tissue  Result Value Ref Range Status   Specimen Description TISSUE  Final   Special Requests NONE  Final   Culture   Final    NO FUNGUS ISOLATED AFTER 3 DAYS Performed at [facility], [place] [address] [place], [place] [address]    Report Status PENDING  Incomplete  Aspergillus Ag, BAL/Serum     Status: None   Collection Time: 08/03/22  8:41 AM   Specimen: Vein; Blood  Result Value Ref Range Status   Aspergillus Ag, BAL/Serum 0.05 0.00 - 0.49 Index Final    Comment: (NOTE) Performed At: [facility] [place], [place] [id number] Rush Farmer MD [id number]      Radiology Studies: No results found.   Holli Humbles DO [facility]  Between 7 am - 7 pm I am available, please contact me via Amion (for emergencies) or Securechat (non urgent messages)  Between 7 pm - 7 am I am not available, please contact night coverage MD/APP via Amion

## 2022-08-05 NOTE — Progress Notes (Signed)
Patient ID: Devin Barker, male   DOB: 1996/03/09, 26 y.o.   MRN: [id number] Visual field changes would have nothing to do with the lesion. This is not an intradural process. Anatomically impossible for the lesion or resection site to affect his vision. Stabbing pain in the eye would be localized to the eye.. either ophthalmology or possible ent to evaluate for a sinus problem. Though that is highly unlikely since his sinuses are ok on recent CTs. The postoperative periorbital edema is not painful, and can only indirectly cause vision loss by hindering opening of his eye.  No real indication for mri based on the lesion resection. If you believe there is an intrinsic optic nerve, or occipital lobe issue then MRI would be helpful.

## 2022-08-06 NOTE — Progress Notes (Signed)
Overall stable.  Minimal headache.  No other complaints today.  Cultures have grown Propionibacterium.  This is an indolent nonaggressive infection but is consistent with his overall progression.  Patient will need antibiotic course per infectious disease.  Mobilize ad lib.

## 2022-08-06 NOTE — Progress Notes (Addendum)
PROGRESS NOTE  Devin Barker [id number] DOB: 11/09/1995 DOA: 07/30/2022 PCP: Patient, No Pcp Per   LOS: 6 days   Brief Narrative / Interim history: 26 year old male without significant past medical history who comes into the hospital with right-sided headache and vomiting.  Imaging in the emergency room shows soft tissue mass, skull defect with some erosion and dural enhancement, concerning for abscess.  Patient's urine drug screen was positive for opioids, cocaine, amphetamines, THC.  He admits to shooting cocaine.   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 Christella Noa consulted, patient was taken to the OR on 11/13 status post right craniectomy for resection.  Operative note mention that purulent material was expressed -ID consulted, continue broad-spectrum antibiotics: cefepime (vancomycin discontinued 17th given negative MRSA) -Gram stain was negative, follow cultures - preliminary surgical cultures showing 'PROPIONIBACTERIUM ACNES' - sensitivities pending -AFB and fungal stain pending, Fungitel negative   Intractable headache/vision changes -Stat CT head ordered, MRI pending - awaiting neurosurgical evaluation -R lateral hemianopsia(blurred more than truly lost vision) with 'stabbing' headache behind his eye.  Polysubstance abuse Pain-seeking behavior -UDS positive for opiates, cocaine, amphetamines, THC.  -Patient continues to complaint of transient pain in the eye, chest, leg, and arm - these are fleeting events lasting maybe seconds to a minute.  No IV narcotics to be given. Patient would like the treatment team to refrain in discussing his polysubstance use with his family  Nausea, vomiting-Resolved  Chronic thrombocytosis -Continue to follow, stable near baseline  Tobacco abuse Continues to ask when he can start smoking again - we discussed ideally that would be never. Offered nicotine replacement  Scheduled Meds:  nicotine  7 mg Transdermal Daily   pantoprazole  40 mg Oral Daily   senna-docusate  1 tablet Oral BID   Continuous Infusions:  ceFEPime (MAXIPIME) IV 2 g (08/06/22 0629)   metronidazole 500 mg (08/06/22 0518)   PRN Meds:.acetaminophen **OR** [DISCONTINUED] acetaminophen, bisacodyl, guaiFENesin-dextromethorphan, HYDROcodone-acetaminophen, ibuprofen, labetalol, magnesium citrate, menthol-cetylpyridinium, naLOXone (NARCAN)  injection, ondansetron **OR** ondansetron (ZOFRAN) IV, mouth rinse, polyethylene glycol, promethazine  No current outpatient medications  Diet Orders (From admission, onward)     Start     Ordered   08/01/22 0245  Diet regular Room service appropriate? Yes; Fluid consistency: Thin  Diet effective now       Question Answer Comment  Room service appropriate? Yes   Fluid consistency: Thin      08/01/22 0244            DVT prophylaxis: SCDs Start: 07/31/22 2250 SCDs Start: 07/31/22 0328   Lab Results  Component Value Date   PLT 547 (H) 08/05/2022      Code Status: Full Code  Family Communication: None present  Status is: Inpatient Remains inpatient appropriate because: severity of illness; need for IV antibiotics and unsafe to discharge with PICC due to IV drug abuse  Level of care: MedSurg  Consultants:  Neurosurgery   Objective: Vitals:   08/05/22 1643 08/05/22 2000 08/06/22 0000 08/06/22 0400  BP: 134/65 128/68 130/70 138/61  Pulse: 87 82 84 86  Resp:  16 16 16   Temp:  98.7 F (37.1 C) 98.6 F (37 C) 98.6 F (37 C) 98.5 F (36.9 C)  TempSrc: Oral Oral Oral  Oral  SpO2: 98% 99% 98% 98%  Weight:      Height:       No intake or output data in the 24 hours ending 08/06/22 0745  Wt Readings from Last 3 Encounters:  07/31/22 122.5 kg  05/01/22 122.5 kg  12/08/16 113.4 kg (>99 %, Z= 2.38)*   * Growth percentiles are based on CDC (Boys, 2-20 Years) data.    Examination:  Constitutional: NAD HEENMT: Mucous membranes are moist. Moderate edema R temporal area with post op incision clean/dry/intact Neck: normal, supple Respiratory: clear to auscultation bilaterally, no wheezing, no crackles. Normal respiratory effort. No accessory muscle use.  Cardiovascular: Regular rate and rhythm, no murmurs / rubs / gallops. No LE edema.  Abdomen: non distended, no tenderness. Bowel sounds positive.  Musculoskeletal: no clubbing / cyanosis.  Neurologic: Without overt deficits   Data Reviewed: I have independently reviewed following labs and imaging studies   CBC Recent Labs  Lab 07/31/22 0020 07/31/22 0430 08/02/22 0853 08/03/22 0605 08/05/22 0440  WBC 11.8* 13.5* 13.9* 12.4* 12.9*  HGB 15.4 14.5 13.4 13.7 14.0  HCT 49.1 46.6 41.7 43.5 43.4  PLT 617* 555* 511* 521* 547*  MCV 84.1 85.7 82.2 84.0 82.7  MCH 26.4 26.7 26.4 26.4 26.7  MCHC 31.4 31.1 32.1 31.5 32.3  RDW 14.5 14.6 14.4 14.5 14.4  LYMPHSABS 2.8  --   --   --   --   MONOABS 0.5  --   --   --   --   EOSABS 0.5  --   --   --   --   BASOSABS 0.1  --   --   --   --      Recent Labs  Lab 07/30/22 2220 07/31/22 0430 08/02/22 0853 08/03/22 0605 08/05/22 0440  NA 138  --  136 134* 138  K 4.3  --  3.5 3.9 3.9  CL 101  --  107 104 106  CO2 26  --  22 25 25   GLUCOSE 111*  --  138* 91 101*  BUN 6  --  6 <5* <5*  CREATININE 0.83  --  0.61 0.56* 0.61  CALCIUM 9.5  --  8.3* 8.3* 8.6*  AST 18  --   --   --   --   ALT 22  --   --   --   --   ALKPHOS 64  --   --   --   --   BILITOT 0.6  --   --   --   --   ALBUMIN 3.8  --   --   --   --   TSH  --  2.682  --   --   --       ------------------------------------------------------------------------------------------------------------------ No results for input(s): "CHOL", "HDL", "LDLCALC", "TRIG", "CHOLHDL", "LDLDIRECT" in the last 72 hours.  No results found for: "HGBA1C" ------------------------------------------------------------------------------------------------------------------ No results for input(s): "TSH", "T4TOTAL", "T3FREE", "THYROIDAB" in the last 72 hours.  Invalid input(s): "FREET3"   Cardiac Enzymes No results for input(s): "CKMB", "TROPONINI", "MYOGLOBIN" in the last 168 hours.  Invalid input(s): "CK" ------------------------------------------------------------------------------------------------------------------ No results found for: "BNP"  CBG: Recent Labs  Lab 07/30/22 2124  GLUCAP 119*     Recent Results (from the past 240 hour(s))  Resp Panel by RT-PCR (Flu A&B, Covid) Anterior Nasal Swab  Status: None   Collection Time: 07/30/22 10:05 PM   Specimen: Anterior Nasal Swab  Result Value Ref Range Status   SARS Coronavirus 2 by RT PCR NEGATIVE NEGATIVE Final    Comment: (NOTE) SARS-CoV-2 target nucleic acids are NOT DETECTED.  The SARS-CoV-2 RNA is generally detectable in upper respiratory specimens during the acute phase of infection. The lowest concentration of SARS-CoV-2 viral copies this assay can detect is 138 copies/mL. A negative result does not preclude SARS-Cov-2 infection and should not be used as the sole basis for treatment or other patient management decisions. A negative result may occur with  improper specimen collection/handling, submission of specimen other than nasopharyngeal swab, presence of viral mutation(s) within the areas targeted by this assay, and inadequate number of viral copies(<138 copies/mL). A negative result must be combined with clinical observations, patient history, and epidemiological information. The expected result is  Negative.  Fact Sheet for Patients:  EntrepreneurPulse.com.au  Fact Sheet for Healthcare Providers:  IncredibleEmployment.be  This test is no t yet approved or cleared by the [place] FDA and  has been authorized for detection and/or diagnosis of SARS-CoV-2 by FDA under an Emergency Use Authorization (EUA). This EUA will remain  in effect (meaning this test can be used) for the duration of the COVID-19 declaration under Section 564(b)(1) of the Act, 21 U.S.C.section 360bbb-3(b)(1), unless the authorization is terminated  or revoked sooner.       Influenza A by PCR NEGATIVE NEGATIVE Final   Influenza B by PCR NEGATIVE NEGATIVE Final    Comment: (NOTE) The Xpert Xpress SARS-CoV-2/FLU/RSV plus assay is intended as an aid in the diagnosis of influenza from Nasopharyngeal swab specimens and should not be used as a sole basis for treatment. Nasal washings and aspirates are unacceptable for Xpert Xpress SARS-CoV-2/FLU/RSV testing.  Fact Sheet for Patients: EntrepreneurPulse.com.au  Fact Sheet for Healthcare Providers: IncredibleEmployment.be  This test is not yet approved or cleared by the [place] FDA and has been authorized for detection and/or diagnosis of SARS-CoV-2 by FDA under an Emergency Use Authorization (EUA). This EUA will remain in effect (meaning this test can be used) for the duration of the COVID-19 declaration under Section 564(b)(1) of the Act, 21 U.S.C. section 360bbb-3(b)(1), unless the authorization is terminated or revoked.  Performed at [facility], [place] [address] [place], [place] [address]   Blood culture (routine x 2)     Status: None   Collection Time: 07/31/22  2:30 AM   Specimen: BLOOD RIGHT HAND  Result Value Ref Range Status   Specimen Description BLOOD RIGHT HAND  Final   Special Requests   Final    BOTTLES DRAWN AEROBIC AND ANAEROBIC Blood Culture results  may not be optimal due to an inadequate volume of blood received in culture bottles   Culture   Final    NO GROWTH 5 DAYS Performed at [facility], [place] [address] [place], [place] [address]    Report Status 08/05/2022 FINAL  Final  Blood culture (routine x 2)     Status: None   Collection Time: 07/31/22  3:00 AM   Specimen: BLOOD  Result Value Ref Range Status   Specimen Description BLOOD RIGHT ANTECUBITAL  Final   Special Requests   Final    BOTTLES DRAWN AEROBIC AND ANAEROBIC Blood Culture adequate volume   Culture   Final    NO GROWTH 5 DAYS Performed at [facility], [place] [address], [place], [place] [address]  Report Status 08/05/2022 FINAL  Final  Aerobic/Anaerobic Culture w Gram Stain (surgical/deep wound)     Status: None   Collection Time: 07/31/22  8:05 PM   Specimen: Soft Tissue, Other  Result Value Ref Range Status   Specimen Description WOUND  Final   Special Requests SWABS OF RIGHT CORONAL SUTURE MASS  Final   Gram Stain   Final    RARE WBC PRESENT, PREDOMINANTLY MONONUCLEAR NO ORGANISMS SEEN    Culture   Final    RARE PROPIONIBACTERIUM ACNES Standardized susceptibility testing for this organism is not available. Performed at [facility], [address] [place], [place] [address]    Report Status 08/05/2022 FINAL  Final  Aerobic/Anaerobic Culture w Gram Stain (surgical/deep wound)     Status: None   Collection Time: 07/31/22  8:16 PM   Specimen: Soft Tissue, Other  Result Value Ref Range Status   Specimen Description TISSUE  Final   Special Requests RT CORONAL SUTURE MASS  Final   Gram Stain NO WBC SEEN NO ORGANISMS SEEN   Final   Culture   Final    RARE PROPIONIBACTERIUM ACNES Standardized susceptibility testing for this organism is not available. Performed at [facility], [address], [place], [place] [address]    Report Status 08/05/2022 FINAL  Final  MRSA Next Gen by PCR, Nasal     Status: None   Collection Time:  08/01/22  2:15 AM   Specimen: Nasal Mucosa; Nasal Swab  Result Value Ref Range Status   MRSA by PCR Next Gen NOT DETECTED NOT DETECTED Final    Comment: (NOTE) The GeneXpert MRSA Assay (FDA approved for NASAL specimens only), is one component of a comprehensive MRSA colonization surveillance program. It is not intended to diagnose MRSA infection nor to guide or monitor treatment for MRSA infections. Test performance is not FDA approved in patients less than 75 years old. Performed at [facility], [address], [place], [place] [address]   Acid Fast Smear (AFB)     Status: None   Collection Time: 08/01/22  9:01 AM   Specimen: Tissue  Result Value Ref Range Status   AFB Specimen Processing Concentration  Final   Acid Fast Smear Negative  Final    Comment: (NOTE) Performed At: [facility] [address] [place], [place] [id number] Jolene Schimke MD [id number]:[id number]    Source (AFB) TISSUE  Final    Comment: Performed at [facility], [address], [place], [place] [address]  Acid Fast Smear (AFB)     Status: None   Collection Time: 08/01/22  9:16 AM   Specimen: Tissue  Result Value Ref Range Status   AFB Specimen Processing Concentration  Final   Acid Fast Smear Negative  Final    Comment: (NOTE) Performed At: [facility] [address] [place], [place] [id number] Jolene Schimke MD [id number]:[id number]    Source (AFB) TISSUE  Final    Comment: Performed at [facility], [address], [place], [place] [address]  Culture, fungus without smear     Status: None (Preliminary result)   Collection Time: 08/01/22  9:16 AM   Specimen: Tissue; Other  Result Value Ref Range Status   Specimen Description TISSUE  Final   Special Requests NONE  Final   Culture   Final    NO FUNGUS ISOLATED AFTER 4 DAYS Performed at [facility], [address] [place], [place] [address]  Report Status PENDING  Incomplete  Culture, fungus without smear      Status: None (Preliminary result)   Collection Time: 08/01/22  9:30 AM   Specimen: Tissue  Result Value Ref Range Status   Specimen Description TISSUE  Final   Special Requests NONE  Final   Culture   Final    NO FUNGUS ISOLATED AFTER 4 DAYS Performed at [facility], [address] [place], [place] [address]    Report Status PENDING  Incomplete  Aspergillus Ag, BAL/Serum     Status: None   Collection Time: 08/03/22  8:41 AM   Specimen: Vein; Blood  Result Value Ref Range Status   Aspergillus Ag, BAL/Serum 0.05 0.00 - 0.49 Index Final    Comment: (NOTE) Performed At: [facility] [place], [place] [id number] Rush Farmer MD [id number]      Radiology Studies: No results found.   Holli Humbles DO [facility]  Between 7 am - 7 pm I am available, please contact me via Amion (for emergencies) or Securechat (non urgent messages)  Between 7 pm - 7 am I am not available, please contact night coverage MD/APP via Amion

## 2022-08-07 DIAGNOSIS — G060 Intracranial abscess and granuloma: Secondary | ICD-10-CM | POA: Diagnosis not present

## 2022-08-07 DIAGNOSIS — M8668 Other chronic osteomyelitis, other site: Secondary | ICD-10-CM | POA: Diagnosis not present

## 2022-08-07 LAB — RAPID URINE DRUG SCREEN, HOSP PERFORMED
Amphetamines: NOT DETECTED
Barbiturates: NOT DETECTED
Benzodiazepines: NOT DETECTED
Cocaine: NOT DETECTED
Opiates: POSITIVE — AB
Tetrahydrocannabinol: NOT DETECTED

## 2022-08-07 LAB — BASIC METABOLIC PANEL
Anion gap: 7 (ref 5–15)
BUN: 6 mg/dL (ref 6–20)
CO2: 25 mmol/L (ref 22–32)
Calcium: 9 mg/dL (ref 8.9–10.3)
Chloride: 106 mmol/L (ref 98–111)
Creatinine, Ser: 0.57 mg/dL — ABNORMAL LOW (ref 0.61–1.24)
GFR, Estimated: >60 mL/min (ref >60)
Glucose, Bld: 99 mg/dL (ref 70–99)
Potassium: 4 mmol/L (ref 3.5–5.1)
Sodium: 138 mmol/L (ref 135–145)

## 2022-08-07 LAB — CBC
HCT: 43.3 % (ref 39.0–52.0)
Hemoglobin: 14.3 g/dL (ref 13.0–17.0)
MCH: 27.2 pg (ref 26.0–34.0)
MCHC: 33 g/dL (ref 30.0–36.0)
MCV: 82.3 fL (ref 80.0–100.0)
Platelets: 599 10*3/uL — ABNORMAL HIGH (ref 150–400)
RBC: 5.26 MIL/uL (ref 4.22–5.81)
RDW: 14.4 % (ref 11.5–15.5)
WBC: 12.2 10*3/uL — ABNORMAL HIGH (ref 4.0–10.5)
nRBC: 0 % (ref 0.0–0.2)

## 2022-08-07 MED ORDER — SODIUM CHLORIDE 0.9 % IV SOLN: INTRAVENOUS | Status: DC

## 2022-08-07 MED ORDER — PENICILLIN G POTASSIUM 20000000 UNITS IJ SOLR
12.0000 10*6.[IU] | Freq: Two times a day (BID) | INTRAVENOUS | Status: AC
  Administered 2022-08-07 – 2022-08-13 (×11): 12 10*6.[IU] via INTRAVENOUS
  Filled 2022-08-07 (×12): qty 12

## 2022-08-07 MED ORDER — PENICILLIN G POTASSIUM 5000000 UNITS IJ SOLR
24.0000 10*6.[IU] | INTRAVENOUS | Status: DC
  Filled 2022-08-07: qty 24

## 2022-08-07 NOTE — Plan of Care (Signed)
  Problem: Education: Goal: Knowledge of General Education information will improve Description: Including pain rating scale, medication(s)/side effects and non-pharmacologic comfort measures Outcome: Progressing   Problem: Health Behavior/Discharge Planning: Goal: Ability to manage health-related needs will improve Outcome: Progressing   Problem: Clinical Measurements: Goal: Ability to maintain clinical measurements within normal limits will improve Outcome: Progressing Goal: Will remain free from infection Outcome: Progressing Goal: Diagnostic test results will improve Outcome: Progressing Goal: Respiratory complications will improve Outcome: Progressing Goal: Cardiovascular complication will be avoided Outcome: Progressing   Problem: Activity: Goal: Risk for activity intolerance will decrease Outcome: Progressing   Problem: Nutrition: Goal: Adequate nutrition will be maintained Outcome: Progressing   Problem: Coping: Goal: Level of anxiety will decrease Outcome: Progressing   Problem: Elimination: Goal: Will not experience complications related to bowel motility Outcome: Progressing Goal: Will not experience complications related to urinary retention Outcome: Progressing   Problem: Pain Managment: Goal: General experience of comfort will improve Outcome: Progressing   Problem: Safety: Goal: Ability to remain free from injury will improve Outcome: Progressing   Problem: Skin Integrity: Goal: Risk for impaired skin integrity will decrease Outcome: Progressing   Problem: Education: Goal: Knowledge of the prescribed therapeutic regimen will improve Outcome: Progressing   Problem: Clinical Measurements: Goal: Usual level of consciousness will be regained or maintained. Outcome: Progressing Goal: Neurologic status will improve Outcome: Progressing Goal: Ability to maintain intracranial pressure will improve Outcome: Progressing   Problem: Skin  Integrity: Goal: Demonstration of wound healing without infection will improve Outcome: Progressing   

## 2022-08-07 NOTE — Progress Notes (Signed)
PROGRESS NOTE  Devin Barker IWL:[id number] DOB: May 19, 1996 DOA: 07/30/2022 PCP: Patient, No Pcp Per   LOS: 7 days   Brief Narrative / Interim history: 26 year old male without significant past medical history who comes into the hospital with right-sided headache and vomiting.  Imaging in the emergency room shows soft tissue mass, skull defect with some erosion and dural enhancement, concerning for abscess.  Patient's urine drug screen was positive for opioids, cocaine, amphetamines, THC.  He admits to shooting cocaine.   Subjective / 25h Interval events: No acute issues or events overnight, pain resolved otherwise denies nausea vomiting diarrhea constipation headache fevers chills or chest pain  Assesement and Plan: Principal Problem:   Intracranial abscess Active Problems:   Polysubstance abuse (HCC)   Thrombocytosis   Nausea and vomiting   Proptosis   Status post surgery  Intracranial abscess/temporalis infection -infection appears to involve the skull and dura -Dr. Franky Macho consulted, patient was taken to the OR on 11/13 status post right craniectomy for resection.  Operative note mention that purulent material was expressed -ID consulted, continue broad-spectrum antibiotics: cefepime (vancomycin discontinued 17th given negative MRSA) -Gram stain was negative, follow cultures - preliminary surgical cultures showing 'PROPIONIBACTERIUM ACNES' - sensitivities pending -AFB and fungal stain pending, Fungitel negative   Intractable headache/vision changes -Stat CT head ordered, MRI pending - awaiting neurosurgical evaluation -R lateral hemianopsia(blurred more than truly lost vision) with 'stabbing' headache behind his eye.  Polysubstance abuse Pain-seeking behavior -UDS positive for opiates, cocaine, amphetamines, THC.  -Patient continues to complaint of transient pain in the eye, chest, leg, and arm - these are fleeting events lasting maybe seconds to a minute.  No IV narcotics to be given. Patient would like the treatment team to refrain in discussing his polysubstance use with his family  Nausea, vomiting-Resolved  Chronic thrombocytosis -Continue to follow, stable near baseline  Tobacco abuse Continues to ask when he can start smoking again - we discussed ideally that would be never. Offered nicotine replacement  Scheduled Meds:  nicotine  7 mg Transdermal Daily   pantoprazole  40 mg Oral Daily   senna-docusate  1 tablet Oral BID   Continuous Infusions:  ceFEPime (MAXIPIME) IV 2 g (08/07/22 0428)   metronidazole 500 mg (08/07/22 0317)   PRN Meds:.acetaminophen **OR** [DISCONTINUED] acetaminophen, bisacodyl, guaiFENesin-dextromethorphan, HYDROcodone-acetaminophen, ibuprofen, labetalol, magnesium citrate, menthol-cetylpyridinium, naLOXone (NARCAN)  injection, ondansetron **OR** ondansetron (ZOFRAN) IV, mouth rinse, polyethylene glycol, promethazine  No current outpatient medications  Diet Orders (From admission, onward)     Start     Ordered   08/01/22 0245  Diet regular Room service appropriate? Yes; Fluid consistency: Thin  Diet effective now       Question Answer Comment  Room service appropriate? Yes   Fluid consistency: Thin      08/01/22 0244            DVT prophylaxis: SCDs Start: 07/31/22 2250 SCDs Start: 07/31/22 0328   Lab Results  Component Value Date   PLT 547 (H) 08/05/2022      Code Status: Full Code  Family Communication: None present  Status is: Inpatient Remains inpatient appropriate because: severity of illness; need for IV antibiotics and unsafe to discharge with PICC due to IV drug abuse  Level of care: MedSurg  Consultants:  Neurosurgery   Objective: Vitals:   08/06/22 0941 08/06/22 2009 08/06/22 2342 08/07/22 0330  BP: 116/71 128/66 (!) 116/59 117/62  Pulse: 92 73 83 86  Resp: 17  17 16   Temp:  98.4 F (36.9 C) 98.7 F (37.1 C) 98.2 F (36.8 C) 98.6 F (37 C)  TempSrc: Oral Oral  Oral Oral  SpO2: 100% 98% 96% 96%  Weight:      Height:        Intake/Output Summary (Last 24 hours) at 08/07/2022 0730 Last data filed at 08/07/2022 0405 Gross per 24 hour  Intake 4758.21 ml  Output --  Net 4758.21 ml    Wt Readings from Last 3 Encounters:  07/31/22 122.5 kg  05/01/22 122.5 kg  12/08/16 113.4 kg (>99 %, Z= 2.38)*   * Growth percentiles are based on CDC (Boys, 2-20 Years) data.    Examination:  Constitutional: NAD HEENMT: Mucous membranes are moist. Moderate edema R temporal area with post op incision clean/dry/intact Neck: normal, supple Respiratory: clear to auscultation bilaterally, no wheezing, no crackles. Normal respiratory effort. No accessory muscle use.  Cardiovascular: Regular rate and rhythm, no murmurs / rubs / gallops. No LE edema.  Abdomen: non distended, no tenderness. Bowel sounds positive.  Musculoskeletal: no clubbing / cyanosis.  Neurologic: Without overt deficits   Data Reviewed: I have independently reviewed following labs and imaging studies   CBC Recent Labs  Lab 08/02/22 0853 08/03/22 0605 08/05/22 0440  WBC 13.9* 12.4* 12.9*  HGB 13.4 13.7 14.0  HCT 41.7 43.5 43.4  PLT 511* 521* 547*  MCV 82.2 84.0 82.7  MCH 26.4 26.4 26.7  MCHC 32.1 31.5 32.3  RDW 14.4 14.5 14.4     Recent Labs  Lab 08/02/22 0853 08/03/22 0605 08/05/22 0440  NA 136 134* 138  K 3.5 3.9 3.9  CL 107 104 106  CO2 22 25 25   GLUCOSE 138* 91 101*  BUN 6 <5* <5*  CREATININE 0.61 0.56* 0.61  CALCIUM 8.3* 8.3* 8.6*     ------------------------------------------------------------------------------------------------------------------ No results for input(s): "CHOL", "HDL", "LDLCALC", "TRIG", "CHOLHDL", "LDLDIRECT" in the last 72 hours.  No results found for: "HGBA1C" ------------------------------------------------------------------------------------------------------------------ No results for input(s): "TSH", "T4TOTAL", "T3FREE", "THYROIDAB" in  the last 72 hours.  Invalid input(s): "FREET3"   Cardiac Enzymes No results for input(s): "CKMB", "TROPONINI", "MYOGLOBIN" in the last 168 hours.  Invalid input(s): "CK" ------------------------------------------------------------------------------------------------------------------ No results found for: "BNP"  CBG: No results for input(s): "GLUCAP" in the last 168 hours.   Recent Results (from the past 240 hour(s))  Resp Panel by RT-PCR (Flu A&B, Covid) Anterior Nasal Swab     Status: None   Collection Time: 07/30/22 10:05 PM   Specimen: Anterior Nasal Swab  Result Value Ref Range Status   SARS Coronavirus 2 by RT PCR NEGATIVE NEGATIVE Final    Comment: (NOTE) SARS-CoV-2 target nucleic acids are NOT DETECTED.  The SARS-CoV-2 RNA is generally detectable in upper respiratory specimens during the acute phase of infection. The lowest concentration of SARS-CoV-2 viral copies this assay can detect is 138 copies/mL. A negative result does not preclude SARS-Cov-2 infection and should not be used as the sole basis for treatment or other patient management decisions. A negative result may occur with  improper specimen collection/handling, submission of specimen other than nasopharyngeal swab, presence of viral mutation(s) within the areas targeted by this assay, and inadequate number of viral copies(<138 copies/mL). A negative result must be combined with clinical observations, patient history, and epidemiological information. The expected result is Negative.  Fact Sheet for Patients:  EntrepreneurPulse.com.au  Fact Sheet for Healthcare Providers:  IncredibleEmployment.be  This test is no t yet approved or cleared by the [place] FDA and  has  been authorized for detection and/or diagnosis of SARS-CoV-2 by FDA under an Emergency Use Authorization (EUA). This EUA will remain  in effect (meaning this test can be used) for the duration of  the COVID-19 declaration under Section 564(b)(1) of the Act, 21 U.S.C.section 360bbb-3(b)(1), unless the authorization is terminated  or revoked sooner.       Influenza A by PCR NEGATIVE NEGATIVE Final   Influenza B by PCR NEGATIVE NEGATIVE Final    Comment: (NOTE) The Xpert Xpress SARS-CoV-2/FLU/RSV plus assay is intended as an aid in the diagnosis of influenza from Nasopharyngeal swab specimens and should not be used as a sole basis for treatment. Nasal washings and aspirates are unacceptable for Xpert Xpress SARS-CoV-2/FLU/RSV testing.  Fact Sheet for Patients: EntrepreneurPulse.com.au  Fact Sheet for Healthcare Providers: IncredibleEmployment.be  This test is not yet approved or cleared by the [place] FDA and has been authorized for detection and/or diagnosis of SARS-CoV-2 by FDA under an Emergency Use Authorization (EUA). This EUA will remain in effect (meaning this test can be used) for the duration of the COVID-19 declaration under Section 564(b)(1) of the Act, 21 U.S.C. section 360bbb-3(b)(1), unless the authorization is terminated or revoked.  Performed at [facility], [place] [address], [place], [place] [address]   Blood culture (routine x 2)     Status: None   Collection Time: 07/31/22  2:30 AM   Specimen: BLOOD RIGHT HAND  Result Value Ref Range Status   Specimen Description BLOOD RIGHT HAND  Final   Special Requests   Final    BOTTLES DRAWN AEROBIC AND ANAEROBIC Blood Culture results may not be optimal due to an inadequate volume of blood received in culture bottles   Culture   Final    NO GROWTH 5 DAYS Performed at [facility], [place] [address], [place], [place] [address]    Report Status 08/05/2022 FINAL  Final  Blood culture (routine x 2)     Status: None   Collection Time: 07/31/22  3:00 AM   Specimen: BLOOD  Result Value Ref Range Status   Specimen Description BLOOD RIGHT ANTECUBITAL  Final   Special  Requests   Final    BOTTLES DRAWN AEROBIC AND ANAEROBIC Blood Culture adequate volume   Culture   Final    NO GROWTH 5 DAYS Performed at [facility], [place] [address] [place],  [address]    Report Status 08/05/2022 FINAL  Final  Aerobic/Anaerobic Culture w Gram Stain (surgical/deep wound)     Status: None   Collection Time: 07/31/22  8:05 PM   Specimen: Soft Tissue, Other  Result Value Ref Range Status   Specimen Description WOUND  Final   Special Requests SWABS OF RIGHT CORONAL SUTURE MASS  Final   Gram Stain   Final    RARE WBC PRESENT, PREDOMINANTLY MONONUCLEAR NO ORGANISMS SEEN    Culture   Final    RARE PROPIONIBACTERIUM ACNES Standardized susceptibility testing for this organism is not available. Performed at [facility], [place] [address], [place],  [address]    Report Status 08/05/2022 FINAL  Final  Aerobic/Anaerobic Culture w Gram Stain (surgical/deep wound)     Status: None   Collection Time: 07/31/22  8:16 PM   Specimen: Soft Tissue, Other  Result Value Ref Range Status   Specimen Description TISSUE  Final   Special Requests RT CORONAL SUTURE MASS  Final   Gram Stain NO WBC SEEN NO ORGANISMS SEEN  Final   Culture   Final    RARE PROPIONIBACTERIUM ACNES Standardized susceptibility testing for this organism is not available. Performed at [facility], [place] [address] [place], [place] [address]    Report Status 08/05/2022 FINAL  Final  MRSA Next Gen by PCR, Nasal     Status: None   Collection Time: 08/01/22  2:15 AM   Specimen: Nasal Mucosa; Nasal Swab  Result Value Ref Range Status   MRSA by PCR Next Gen NOT DETECTED NOT DETECTED Final    Comment: (NOTE) The GeneXpert MRSA Assay (FDA approved for NASAL specimens only), is one component of a comprehensive MRSA colonization surveillance program. It is not intended to diagnose MRSA infection nor to guide or monitor treatment for MRSA infections. Test performance is not FDA approved  in patients less than 57 years old. Performed at [facility], [place] [address] [place], [place] [address]   Acid Fast Smear (AFB)     Status: None   Collection Time: 08/01/22  9:01 AM   Specimen: Tissue  Result Value Ref Range Status   AFB Specimen Processing Concentration  Final   Acid Fast Smear Negative  Final    Comment: (NOTE) Performed At: [facility] [place], [place] [id number] Rush Farmer MD [id number]    Source (AFB) TISSUE  Final    Comment: Performed at [facility], [place] [address] [place], [place] [address]  Acid Fast Smear (AFB)     Status: None   Collection Time: 08/01/22  9:16 AM   Specimen: Tissue  Result Value Ref Range Status   AFB Specimen Processing Concentration  Final   Acid Fast Smear Negative  Final    Comment: (NOTE) Performed At: [facility] [place], [place] [id number] Rush Farmer MD [id number]    Source (AFB) TISSUE  Final    Comment: Performed at [facility], [place] [address] [place], [place] [address]  Culture, fungus without smear     Status: None (Preliminary result)   Collection Time: 08/01/22  9:16 AM   Specimen: Tissue; Other  Result Value Ref Range Status   Specimen Description TISSUE  Final   Special Requests NONE  Final   Culture   Final    NO FUNGUS ISOLATED AFTER 5 DAYS Performed at [facility], [address] [place], [place] [address]    Report Status PENDING  Incomplete  Culture, fungus without smear     Status: None (Preliminary result)   Collection Time: 08/01/22  9:30 AM   Specimen: Tissue  Result Value Ref Range Status   Specimen Description TISSUE  Final   Special Requests NONE  Final   Culture   Final    NO FUNGUS ISOLATED AFTER 5 DAYS Performed at [place] [address] [place], [place] [address]    Report Status PENDING  Incomplete  Aspergillus Ag, BAL/Serum     Status: None   Collection Time: 08/03/22  8:41 AM    Specimen: Vein; Blood  Result Value Ref Range Status   Aspergillus Ag, BAL/Serum 0.05 0.00 - 0.49 Index Final    Comment: (NOTE) Performed At: [facility] [address] [place], [place] [id number] Rush Farmer MD [id number]      Radiology Studies: No results found.   Holli Humbles DO [facility]  Between 7 am - 7 pm I am available, please contact me via Amion (for emergencies) or Securechat (  non urgent messages)  Between 7 pm - 7 am I am not available, please contact night coverage MD/APP via Amion
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
## 2022-08-07 NOTE — Progress Notes (Signed)
EOS: Pt A&Ox4, with friend in room who came out of the bathroom while this RN was in the room - A&O as well. Become somnolent during shift, arousing with loud speaking and shaking - same with friend at the bedside. After arousing patient, had conversation about Senna. Pt later stated this AM that he has no recollection of our conversation, and was sleeping at the time. Pt jittery, A&Ox4, ambulating around room at this time.

## 2022-08-07 NOTE — TOC Progression Note (Signed)
Transition of Care [facility]) - Progression Note    Patient Details  Name: Rollo Farquhar MRN: [id number] Date of Birth: 05-Jul-1996  [facility] CM/SW Contact  Kermit Balo, RN Phone Number: 08/07/2022, 1:33 PM  Clinical Narrative:    Pt here for IV abx for several weeks.  TOC following.   Expected Discharge Plan: Home/Self Care Barriers to Discharge: Continued Medical Work up  Expected Discharge Plan and Services Expected Discharge Plan: Home/Self Care   Discharge Planning Services: CM Consult   Living arrangements for the past 2 months: Single Family Home                                       Social Determinants of Health (SDOH) Interventions    Readmission Risk Interventions     No data to display

## 2022-08-07 NOTE — Progress Notes (Signed)
Patient ID: Devin Barker, male   DOB: [birth year], 26 y.o.   MRN: [id number] BP 127/71 (BP Location: Right Arm)   Pulse (!) 102   Temp 98.4 F (36.9 C) (Oral)   Resp 19   Ht 5\' 7"  (1.702 m)   Wt 122.5 kg   SpO2 96%   BMI 42.29 kg/m  Alert and oriented x 4 Speech is clear and fluent Normal neurological exam. Perrl, full eom, tongue and uvula midline, 5/5 strength Wound is healing well Will remove staples Wednesday.

## 2022-08-08 NOTE — Progress Notes (Addendum)
PROGRESS NOTE  Devin Barker [id number] DOB: 1995-11-08 DOA: 07/30/2022 PCP: Patient, No Pcp Per   LOS: 8 days   Brief Narrative / Interim history: 26 year old male without significant past medical history who comes into the hospital with right-sided headache and vomiting.  Imaging in the emergency room shows soft tissue mass, skull defect with some erosion and dural enhancement, concerning for abscess.  Patient's urine drug screen was positive for opioids, cocaine, amphetamines, THC.  He admits to shooting cocaine.   Subjective / 24h Interval events: No acute issues or events overnight, pain resolved otherwise denies nausea vomiting diarrhea constipation headache fevers chills or chest pain  Assesement and Plan: Principal Problem:   Intracranial abscess Active Problems:   Polysubstance abuse (HCC)   Thrombocytosis   Nausea and vomiting   Proptosis   Status post surgery   Chronic osteomyelitis of skull (HCC)  Intracranial abscess/temporalis infection -infection appears to involve the skull and dura -Dr. Christella Noa consulted, patient was taken to the OR on 11/13 status post right craniectomy for resection.  Operative note mention that purulent material was expressed -Staple removal 08/09/22 -ID consulted, continue broad-spectrum antibiotics: cefepime (vancomycin discontinued 17th given negative MRSA) -Gram stain was negative, follow cultures - preliminary surgical cultures showing 'PROPIONIBACTERIUM ACNES' - sensitivities pending -AFB and fungal stain pending, Fungitel negative   Intractable headache/vision changes -Stat CT head ordered, MRI pending - awaiting neurosurgical evaluation -R lateral hemianopsia(blurred more than truly lost vision) with 'stabbing' headache behind his eye.  Polysubstance abuse Pain-seeking behavior -UDS positive for opiates, cocaine, amphetamines, THC.  -Patient continues to complaint of transient pain in the eye, chest, leg, and  arm - these are fleeting events lasting maybe seconds to a minute. No IV narcotics to be given. Patient would like the treatment team to refrain in discussing his polysubstance use with his family  Nausea, vomiting-Resolved  Chronic thrombocytosis -Continue to follow, stable near baseline  Tobacco abuse Continues to ask when he can start smoking again - we discussed ideally that would be never. Offered nicotine replacement  Scheduled Meds:  nicotine  7 mg Transdermal Daily   pantoprazole  40 mg Oral Daily   senna-docusate  1 tablet Oral BID   Continuous Infusions:  sodium chloride 100 mL/hr at 08/08/22 0516   penicillin G potassium 12 Million Units in dextrose 5 % 500 mL continuous infusion 12 Million Units (08/08/22 0725)   PRN Meds:.acetaminophen **OR** [DISCONTINUED] acetaminophen, bisacodyl, guaiFENesin-dextromethorphan, HYDROcodone-acetaminophen, ibuprofen, labetalol, magnesium citrate, menthol-cetylpyridinium, naLOXone (NARCAN)  injection, ondansetron **OR** ondansetron (ZOFRAN) IV, mouth rinse, polyethylene glycol, promethazine  No current outpatient medications  Diet Orders (From admission, onward)     Start     Ordered   08/01/22 0245  Diet regular Room service appropriate? Yes; Fluid consistency: Thin  Diet effective now       Question Answer Comment  Room service appropriate? Yes   Fluid consistency: Thin      08/01/22 0244            DVT prophylaxis: SCDs Start: 07/31/22 2250 SCDs Start: 07/31/22 0328   Lab Results  Component Value Date   PLT 599 (H) 08/07/2022      Code Status: Full Code  Family Communication: None present  Status is: Inpatient Remains inpatient appropriate because: severity of illness; need for IV antibiotics and unsafe to discharge with PICC due to IV drug abuse  Level of care: MedSurg  Consultants:  Neurosurgery   Objective: Vitals:   08/07/22 2318 08/08/22 0000 08/08/22  0300 08/08/22 0601  BP: (!) 151/79  (!) 152/85    Pulse: (!) 114 (!) 101 86   Resp: 16  18   Temp: 97.8 F (36.6 C)  97.9 F (36.6 C)   TempSrc: Oral     SpO2: 100%  100%   Weight:    120.2 kg  Height:       No intake or output data in the 24 hours ending 08/08/22 0750  Wt Readings from Last 3 Encounters:  08/08/22 120.2 kg  05/01/22 122.5 kg  12/08/16 113.4 kg (>99 %, Z= 2.38)*   * Growth percentiles are based on CDC (Boys, 2-20 Years) data.    Examination:  Constitutional: NAD HEENMT: Mucous membranes are moist. Moderate edema R temporal area with post op incision clean/dry/intact Neck: normal, supple Respiratory: clear to auscultation bilaterally, no wheezing, no crackles. Normal respiratory effort. No accessory muscle use.  Cardiovascular: Regular rate and rhythm, no murmurs / rubs / gallops. No LE edema.  Abdomen: non distended, no tenderness. Bowel sounds positive.  Musculoskeletal: no clubbing / cyanosis.  Neurologic: Without overt deficits   Data Reviewed: I have independently reviewed following labs and imaging studies   CBC Recent Labs  Lab 08/02/22 0853 08/03/22 0605 08/05/22 0440 08/07/22 0711  WBC 13.9* 12.4* 12.9* 12.2*  HGB 13.4 13.7 14.0 14.3  HCT 41.7 43.5 43.4 43.3  PLT 511* 521* 547* 599*  MCV 82.2 84.0 82.7 82.3  MCH 26.4 26.4 26.7 27.2  MCHC 32.1 31.5 32.3 33.0  RDW 14.4 14.5 14.4 14.4     Recent Labs  Lab 08/02/22 0853 08/03/22 0605 08/05/22 0440 08/07/22 0711  NA 136 134* 138 138  K 3.5 3.9 3.9 4.0  CL 107 104 106 106  CO2 22 25 25 25   GLUCOSE 138* 91 101* 99  BUN 6 <5* <5* 6  CREATININE 0.61 0.56* 0.61 0.57*  CALCIUM 8.3* 8.3* 8.6* 9.0     ------------------------------------------------------------------------------------------------------------------ No results for input(s): "CHOL", "HDL", "LDLCALC", "TRIG", "CHOLHDL", "LDLDIRECT" in the last 72 hours.  No results found for:  "HGBA1C" ------------------------------------------------------------------------------------------------------------------ No results for input(s): "TSH", "T4TOTAL", "T3FREE", "THYROIDAB" in the last 72 hours.  Invalid input(s): "FREET3"   Cardiac Enzymes No results for input(s): "CKMB", "TROPONINI", "MYOGLOBIN" in the last 168 hours.  Invalid input(s): "CK" ------------------------------------------------------------------------------------------------------------------ No results found for: "BNP"  CBG: No results for input(s): "GLUCAP" in the last 168 hours.   Recent Results (from the past 240 hour(s))  Resp Panel by RT-PCR (Flu A&B, Covid) Anterior Nasal Swab     Status: None   Collection Time: 07/30/22 10:05 PM   Specimen: Anterior Nasal Swab  Result Value Ref Range Status   SARS Coronavirus 2 by RT PCR NEGATIVE NEGATIVE Final    Comment: (NOTE) SARS-CoV-2 target nucleic acids are NOT DETECTED.  The SARS-CoV-2 RNA is generally detectable in upper respiratory specimens during the acute phase of infection. The lowest concentration of SARS-CoV-2 viral copies this assay can detect is 138 copies/mL. A negative result does not preclude SARS-Cov-2 infection and should not be used as the sole basis for treatment or other patient management decisions. A negative result may occur with  improper specimen collection/handling, submission of specimen other than nasopharyngeal swab, presence of viral mutation(s) within the areas targeted by this assay, and inadequate number of viral copies(<138 copies/mL). A negative result must be combined with clinical observations, patient history, and epidemiological information. The expected result is Negative.  Fact Sheet for Patients:  EntrepreneurPulse.com.au  Fact Sheet for  Healthcare Providers:  SeriousBroker.it  This test is no t yet approved or cleared by the [place] and  has been  authorized for detection and/or diagnosis of SARS-CoV-2 by FDA under an Emergency Use Authorization (EUA). This EUA will remain  in effect (meaning this test can be used) for the duration of the COVID-19 declaration under Section 564(b)(1) of the Act, 21 U.S.C.section 360bbb-3(b)(1), unless the authorization is terminated  or revoked sooner.       Influenza A by PCR NEGATIVE NEGATIVE Final   Influenza B by PCR NEGATIVE NEGATIVE Final    Comment: (NOTE) The Xpert Xpress SARS-CoV-2/FLU/RSV plus assay is intended as an aid in the diagnosis of influenza from Nasopharyngeal swab specimens and should not be used as a sole basis for treatment. Nasal washings and aspirates are unacceptable for Xpert Xpress SARS-CoV-2/FLU/RSV testing.  Fact Sheet for Patients: BloggerCourse.com  Fact Sheet for Healthcare Providers: SeriousBroker.it  This test is not yet approved or cleared by the [place] FDA and has been authorized for detection and/or diagnosis of SARS-CoV-2 by FDA under an Emergency Use Authorization (EUA). This EUA will remain in effect (meaning this test can be used) for the duration of the COVID-19 declaration under Section 564(b)(1) of the Act, 21 U.S.C. section 360bbb-3(b)(1), unless the authorization is terminated or revoked.  Performed at [facility], [address], [place], [place] [address]   Blood culture (routine x 2)     Status: None   Collection Time: 07/31/22  2:30 AM   Specimen: BLOOD RIGHT HAND  Result Value Ref Range Status   Specimen Description BLOOD RIGHT HAND  Final   Special Requests   Final    BOTTLES DRAWN AEROBIC AND ANAEROBIC Blood Culture results may not be optimal due to an inadequate volume of blood received in culture bottles   Culture   Final    NO GROWTH 5 DAYS Performed at [facility], [address], [place], [place] [address]    Report Status 08/05/2022 FINAL  Final  Blood  culture (routine x 2)     Status: None   Collection Time: 07/31/22  3:00 AM   Specimen: BLOOD  Result Value Ref Range Status   Specimen Description BLOOD RIGHT ANTECUBITAL  Final   Special Requests   Final    BOTTLES DRAWN AEROBIC AND ANAEROBIC Blood Culture adequate volume   Culture   Final    NO GROWTH 5 DAYS Performed at [facility], [address] [place], [place] [address]    Report Status 08/05/2022 FINAL  Final  Aerobic/Anaerobic Culture w Gram Stain (surgical/deep wound)     Status: None (Preliminary result)   Collection Time: 07/31/22  8:05 PM   Specimen: Soft Tissue, Other  Result Value Ref Range Status   Specimen Description WOUND  Final   Special Requests SWABS OF RIGHT CORONAL SUTURE MASS  Final   Gram Stain   Final    RARE WBC PRESENT, PREDOMINANTLY MONONUCLEAR NO ORGANISMS SEEN    Culture   Final    RARE PROPIONIBACTERIUM ACNES [facility] [place] FOR SUSCEPTIBILITY Performed at [facility], [address] [place], [place] [address]    Report Status PENDING  Incomplete  Aerobic/Anaerobic Culture w Gram Stain (surgical/deep wound)     Status: None   Collection Time: 07/31/22  8:16 PM   Specimen: Soft Tissue, Other  Result Value Ref Range Status   Specimen Description TISSUE  Final   Special  Requests RT CORONAL SUTURE MASS  Final   Gram Stain NO WBC SEEN NO ORGANISMS SEEN   Final   Culture   Final    RARE PROPIONIBACTERIUM ACNES Standardized susceptibility testing for this organism is not available. Performed at [facility], [address] [place], [place] [address]    Report Status 08/05/2022 FINAL  Final  MRSA Next Gen by PCR, Nasal     Status: None   Collection Time: 08/01/22  2:15 AM   Specimen: Nasal Mucosa; Nasal Swab  Result Value Ref Range Status   MRSA by PCR Next Gen NOT DETECTED NOT DETECTED Final    Comment: (NOTE) The GeneXpert MRSA Assay (FDA approved for NASAL specimens only), is one component of a comprehensive  MRSA colonization surveillance program. It is not intended to diagnose MRSA infection nor to guide or monitor treatment for MRSA infections. Test performance is not FDA approved in patients less than 31 years old. Performed at [facility], [place] [address] [place], [place] [address]   Acid Fast Smear (AFB)     Status: None   Collection Time: 08/01/22  9:01 AM   Specimen: Tissue  Result Value Ref Range Status   AFB Specimen Processing Concentration  Final   Acid Fast Smear Negative  Final    Comment: (NOTE) Performed At: [facility] [place], [place] [id number] Rush Farmer MD [id number]    Source (AFB) TISSUE  Final    Comment: Performed at [facility], [place] [address] [place], [place] [address]  Acid Fast Smear (AFB)     Status: None   Collection Time: 08/01/22  9:16 AM   Specimen: Tissue  Result Value Ref Range Status   AFB Specimen Processing Concentration  Final   Acid Fast Smear Negative  Final    Comment: (NOTE) Performed At: [facility] [place], [place] [id number] Rush Farmer MD [id number]    Source (AFB) TISSUE  Final    Comment: Performed at [facility], [place] [address] [place], [place] [address]  Culture, fungus without smear     Status: None (Preliminary result)   Collection Time: 08/01/22  9:16 AM   Specimen: Tissue; Other  Result Value Ref Range Status   Specimen Description TISSUE  Final   Special Requests NONE  Final   Culture   Final    NO FUNGUS ISOLATED AFTER 6 DAYS Performed at [facility], [address], [place], [place] [address]    Report Status PENDING  Incomplete  Culture, fungus without smear     Status: None (Preliminary result)   Collection Time: 08/01/22  9:30 AM   Specimen: Tissue  Result Value Ref Range Status   Specimen Description TISSUE  Final   Special Requests NONE  Final   Culture   Final    NO FUNGUS ISOLATED AFTER 6 DAYS Performed at [place] [address] [place], [place] [address]    Report Status PENDING  Incomplete  Aspergillus Ag, BAL/Serum     Status: None   Collection Time: 08/03/22  8:41 AM   Specimen: Vein; Blood  Result Value Ref Range Status   Aspergillus Ag, BAL/Serum 0.05 0.00 - 0.49 Index Final    Comment: (NOTE) Performed At: [facility] [place], [place] [id number] Rush Farmer MD [id number]      Radiology Studies: No results found.   Holli Humbles DO [facility]  Between 7 am - 7 pm I am available, please contact me via Amion (for emergencies) or Securechat (non urgent messages)  Between 7 pm - 7 am I am not available, please contact night coverage MD/APP via Amion

## 2022-08-09 ENCOUNTER — Inpatient Hospital Stay: Payer: Self-pay

## 2022-08-09 DIAGNOSIS — F1910 Other psychoactive substance abuse, uncomplicated: Secondary | ICD-10-CM

## 2022-08-09 DIAGNOSIS — G060 Intracranial abscess and granuloma: Secondary | ICD-10-CM | POA: Diagnosis not present

## 2022-08-09 LAB — BASIC METABOLIC PANEL
Anion gap: 9 (ref 5–15)
BUN: 5 mg/dL — ABNORMAL LOW (ref 6–20)
CO2: 24 mmol/L (ref 22–32)
Calcium: 8.5 mg/dL — ABNORMAL LOW (ref 8.9–10.3)
Chloride: 106 mmol/L (ref 98–111)
Creatinine, Ser: 0.53 mg/dL — ABNORMAL LOW (ref 0.61–1.24)
GFR, Estimated: >60 mL/min (ref >60)
Glucose, Bld: 134 mg/dL — ABNORMAL HIGH (ref 70–99)
Potassium: 4 mmol/L (ref 3.5–5.1)
Sodium: 139 mmol/L (ref 135–145)

## 2022-08-09 LAB — CBC
HCT: 42.1 % (ref 39.0–52.0)
Hemoglobin: 13.2 g/dL (ref 13.0–17.0)
MCH: 26.2 pg (ref 26.0–34.0)
MCHC: 31.4 g/dL (ref 30.0–36.0)
MCV: 83.5 fL (ref 80.0–100.0)
Platelets: 559 10*3/uL — ABNORMAL HIGH (ref 150–400)
RBC: 5.04 MIL/uL (ref 4.22–5.81)
RDW: 14.4 % (ref 11.5–15.5)
WBC: 13.1 10*3/uL — ABNORMAL HIGH (ref 4.0–10.5)
nRBC: 0 % (ref 0.0–0.2)

## 2022-08-09 LAB — SURGICAL PATHOLOGY

## 2022-08-09 MED ORDER — SODIUM CHLORIDE 0.9% FLUSH: 10.0000 mL | INTRAVENOUS | Status: DC | PRN

## 2022-08-09 MED ORDER — CHLORHEXIDINE GLUCONATE CLOTH 2 % EX PADS
6.0000 | MEDICATED_PAD | Freq: Every day | CUTANEOUS | Status: DC
  Administered 2022-08-10 – 2022-08-22 (×13): 6 via TOPICAL

## 2022-08-09 MED ORDER — SODIUM CHLORIDE 0.9% FLUSH
10.0000 mL | Freq: Two times a day (BID) | INTRAVENOUS | Status: DC
  Administered 2022-08-09 – 2022-08-20 (×21): 10 mL
  Administered 2022-08-21: 20 mL
  Administered 2022-08-21 – 2022-08-22 (×2): 10 mL

## 2022-08-09 NOTE — Progress Notes (Signed)
[facility]  Date of Admission:  07/30/2022   Total days of inpatient antibiotics 3  Principal Problem:   Intracranial abscess Active Problems:   Polysubstance abuse (HCC)   Thrombocytosis   Nausea and vomiting   Proptosis   Status post surgery   Chronic osteomyelitis of skull (HCC)          Assessment:  [name] admitted with skull mass/abscess on the right side after being struck 3 months prior by a very heavy metal rod  #Skull abscess with sign of skull OM, s/p right craniectomy with mass resection 11/13, with both sets of operative cx growing p-acnes #new onset right temporal blurry vision #hx of IVDA(last used 4 days prior to admission 07/30/22). Report the mass has been there for a couple months with HA. He noted the mass after a fall.   -MRI showed peripherally enhancing collection along right frontal lobe. Operative finding also suggestive of abscess; skull defect mentioned with pathology showing fibrosis/inflammation in the bone as well; process is extraaxial, and operative note didn't mention any hardware placed over skull defect -RPR negative. HIV negative, Urine GC negative -I am not sure what to make of the visual changes -- per nsg anatomically not able to explain with the mass   -------------- 11/22 assessment Doing well Pending tetracycline susceptibility sent out testing (asked to do on 11/20) which should be available in about 2 weeks  Would keep here on iv abx until doxy susceptibility available and can send out with it to finish 6 weeks course. There is no hardware placement per operative note  Ok to place picc   Recommendations: -f/u sent out susceptibility testing  -continue penicillin g -picc placement ordered today -id f/u in clinic will be arranged once discharge date is closer -discussed with primary team   I spent more than 35 minute reviewing data/chart, and coordinating care and >50% direct face to face  time providing counseling/discussing diagnostics/treatment plan with patient     Microbiology:   Antibiotics: Pcn g 11/20-c  Vanc+ cefepime, metro 11/12-11/20  Cultures: Blood 11/12 negative  OR 11/13 p-acnes (2 set of 2 sets)   SUBJECTIVE: No complaint outside of left arm peripheral iv's burning   Review of Systems: All other ros negative   Scheduled Meds:  nicotine  7 mg Transdermal Daily   pantoprazole  40 mg Oral Daily   senna-docusate  1 tablet Oral BID   Continuous Infusions:  sodium chloride 100 mL/hr at 08/09/22 0450   penicillin G potassium 12 Million Units in dextrose 5 % 500 mL continuous infusion 12 Million Units (08/09/22 0701)   PRN Meds:.acetaminophen **OR** [DISCONTINUED] acetaminophen, bisacodyl, guaiFENesin-dextromethorphan, HYDROcodone-acetaminophen, ibuprofen, labetalol, magnesium citrate, menthol-cetylpyridinium, naLOXone (NARCAN)  injection, ondansetron **OR** ondansetron (ZOFRAN) IV, mouth rinse, polyethylene glycol, promethazine Allergies  Allergen Reactions   Other     Pt prefers medications that do not have gelatins. No capsules, gel caps etc....due to religious reasons  Pt also had a reaction to laughing gas (liquid form)     OBJECTIVE: Vitals:   08/08/22 1614 08/08/22 2124 08/09/22 0300 08/09/22 0804  BP: 134/76 101/79 110/80 (!) 118/53  Pulse: 99 (!) 101 93 72  Resp: 18 18 18 19   Temp: 98.3 F (36.8 C) 98.9 F (37.2 C) 98.9 F (37.2 C) 98.3 F (36.8 C)  TempSrc: Oral Oral Oral Oral  SpO2: 100% 100% 100% 98%  Weight:      Height:  Body mass index is 41.5 kg/m.  Physical exam: General/constitutional: no distress, pleasant HEENT: craniotomy incision no purulence/discharge/tenderness Neck supple CV: rrr no mrg Lungs: clear to auscultation, normal respiratory effort Abd: Soft, Nontender Ext: no edema Skin: No Rash Neuro: nonfocal MSK: no peripheral joint swelling/tenderness/warmth; back spines  nontender      Lab Results Lab Results  Component Value Date   WBC 13.1 (H) 08/09/2022   HGB 13.2 08/09/2022   HCT 42.1 08/09/2022   MCV 83.5 08/09/2022   PLT 559 (H) 08/09/2022    Lab Results  Component Value Date   CREATININE 0.53 (L) 08/09/2022   BUN 5 (L) 08/09/2022   NA 139 08/09/2022   K 4.0 08/09/2022   CL 106 08/09/2022   CO2 24 08/09/2022    Lab Results  Component Value Date   ALT 22 07/30/2022   AST 18 07/30/2022   ALKPHOS 64 07/30/2022   BILITOT 0.6 07/30/2022     Imaging: Personally reviewed and incorporated into decision making  11/13 mri brain 1. Peripherally enhancing collection along the right frontal lobe, which extends through an 18 mm calvarial defect along the right frontoparietal suture into the left temporalis muscle, concerning for an abscess. Susceptibility about the medial aspect of the collection may represent trace extra-axial hemorrhage versus calcification. 2. Dural thickening and hyperenhancement along the right frontal and temporal lobe, which may be reactive versus infectious.    Raymondo Band, MD [facility] 08/09/2022, 10:25 AM

## 2022-08-09 NOTE — Progress Notes (Signed)
[facility] PROGRESS NOTE   Devin Barker [id number] DOB: 15-Jul-1996 DOA: 07/30/2022  PCP: Patient, No Pcp Per  Brief History/Interval Summary: 26 year old male without significant past medical history who comes into the hospital with right-sided headache and vomiting.  Imaging in the emergency room shows soft tissue mass, skull defect with some erosion and dural enhancement, concerning for abscess.  Patient's urine drug screen was positive for opioids, cocaine, amphetamines, THC.  He admits to shooting cocaine.  Patient was hospitalized for further management.  Consultants: Neurosurgery.  Infectious disease  Procedures: Right craniectomy    Subjective/Interval History: Patient mentions that he feels well.  Denies any headaches at this time.  No nausea or vomiting.  No shortness of breath.    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 Christella Noa consulted, patient was taken to the OR on 11/13 status post right craniectomy for resection.  Operative note mention that purulent material was expressed -Staple removal 08/09/22 per neurosurgery -ID consulted.  Patient was initially on broad-spectrum antibiotics with vancomycin and cefepime.  Currently on continuous infusion of penicillin G.  -Gram stain was negative, follow cultures - preliminary surgical cultures showing 'PROPIONIBACTERIUM ACNES' - sensitivities pending -AFB and fungal stain pending, Fungitel negative Seems to be stable from a neurological standpoint.  ID to determine final antibiotic plan.  Waiting on susceptibility testing for doxycycline.   Polysubstance abuse Pain-seeking behavior -UDS positive for opiates, cocaine, amphetamines, THC.  -Patient has been complaining of transient pain in the eye, chest, leg, and arm - these are fleeting events lasting maybe seconds to a minute. No IV narcotics to be given. Patient would  like the treatment team to refrain in discussing his polysubstance use with his family   Nausea, vomiting Resolved   Chronic thrombocytosis Continue to follow, stable near baseline   Tobacco abuse Continues to ask when he can start smoking again - we discussed ideally that would be never. Offered nicotine replacement  Class III obesity Estimated body mass index is 41.5 kg/m as calculated from the following:   Height as of this encounter: 5\' 7"  (1.702 m).   Weight as of this encounter: 120.2 kg.   DVT Prophylaxis: SCDs Code Status: Full code Family Communication: Discussed with patient Disposition Plan: Start mobilizing.  Status is: Inpatient Remains inpatient appropriate because: Brain abscess    Medications: Scheduled:  nicotine  7 mg Transdermal Daily   pantoprazole  40 mg Oral Daily   senna-docusate  1 tablet Oral BID   Continuous:  sodium chloride 100 mL/hr at 08/09/22 0450   penicillin G potassium 12 Million Units in dextrose 5 % 500 mL continuous infusion 12 Million Units (08/09/22 0701)   [id number] **OR** [DISCONTINUED] acetaminophen, bisacodyl, guaiFENesin-dextromethorphan, HYDROcodone-acetaminophen, ibuprofen, labetalol, magnesium citrate, menthol-cetylpyridinium, naLOXone (NARCAN)  injection, ondansetron **OR** ondansetron (ZOFRAN) IV, mouth rinse, polyethylene glycol, promethazine  Antibiotics: Anti-infectives (From admission, onward)    Start     Dose/Rate Route Frequency Ordered Stop   08/07/22 1800  penicillin G potassium 12 Million Units in dextrose 5 % 500 mL continuous infusion        12 Million Units 41.7 mL/hr over 12 Hours Intravenous Every 12 hours 08/07/22 1704     08/07/22 1300  penicillin G potassium 24 Million Units in dextrose 5 % 250 mL IVPB  Status:  Discontinued        24 Million Units 250 mL/hr over 60 Minutes Intravenous Every 24 hours 08/07/22 1146 08/07/22 1704  08/02/22 2200  vancomycin (VANCOREADY) IVPB 1750 mg/350 mL   Status:  Discontinued        1,750 mg 175 mL/hr over 120 Minutes Intravenous Every 8 hours 08/02/22 1434 08/02/22 1441   08/02/22 1530  vancomycin (VANCOREADY) IVPB 1750 mg/350 mL  Status:  Discontinued        1,750 mg 175 mL/hr over 120 Minutes Intravenous Every 8 hours 08/02/22 1441 08/04/22 0823   07/31/22 1200  vancomycin (VANCOREADY) IVPB 1250 mg/250 mL  Status:  Discontinued        1,250 mg 166.7 mL/hr over 90 Minutes Intravenous Every 8 hours 07/31/22 0352 08/02/22 1434   07/31/22 1200  ceFEPIme (MAXIPIME) 2 g in sodium chloride 0.9 % 100 mL IVPB  Status:  Discontinued        2 g 200 mL/hr over 30 Minutes Intravenous Every 8 hours 07/31/22 0352 08/07/22 1146   07/31/22 0400  metroNIDAZOLE (FLAGYL) IVPB 500 mg  Status:  Discontinued        500 mg 100 mL/hr over 60 Minutes Intravenous Every 6 hours 07/31/22 0329 08/07/22 1146   07/31/22 0245  vancomycin (VANCOREADY) IVPB 2000 mg/400 mL        2,000 mg 200 mL/hr over 120 Minutes Intravenous STAT 07/31/22 0230 07/31/22 0533   07/31/22 0245  ceFEPIme (MAXIPIME) 2 g in sodium chloride 0.9 % 100 mL IVPB        2 g 200 mL/hr over 30 Minutes Intravenous STAT 07/31/22 0230 07/31/22 0333   07/31/22 0215  vancomycin (VANCOCIN) IVPB 1000 mg/200 mL premix  Status:  Discontinued        1,000 mg 200 mL/hr over 60 Minutes Intravenous  Once 07/31/22 0214 07/31/22 0229       Objective:  Vital Signs  Vitals:   08/08/22 1614 08/08/22 2124 08/09/22 0300 08/09/22 0804  BP: 134/76 101/79 110/80 (!) 118/53  Pulse: 99 (!) 101 93 72  Resp: 18 18 18 19   Temp: 98.3 F (36.8 C) 98.9 F (37.2 C) 98.9 F (37.2 C) 98.3 F (36.8 C)  TempSrc: Oral Oral Oral Oral  SpO2: 100% 100% 100% 98%  Weight:      Height:        Intake/Output Summary (Last 24 hours) at 08/09/2022 1009 Last data filed at 08/09/2022 1000 Gross per 24 hour  Intake 6857.86 ml  Output --  Net 6857.86 ml   Filed Weights   07/31/22 0300 08/08/22 0601  Weight: 122.5 kg 120.2  kg    General appearance: Awake alert.  In no distress Resp: Clear to auscultation bilaterally.  Normal effort Cardio: S1-S2 is normal regular.  No S3-S4.  No rubs murmurs or bruit GI: Abdomen is soft.  Nontender nondistended.  Bowel sounds are present normal.  No masses organomegaly Extremities: No edema.  Full range of motion of lower extremities. Neurologic: Alert and oriented x3.  No focal neurological deficits.    Lab Results:  Data Reviewed: I have personally reviewed following labs and reports of the imaging studies  CBC: Recent Labs  Lab 08/03/22 0605 08/05/22 0440 08/07/22 0711 08/09/22 0729  WBC 12.4* 12.9* 12.2* 13.1*  HGB 13.7 14.0 14.3 13.2  HCT 43.5 43.4 43.3 42.1  MCV 84.0 82.7 82.3 83.5  PLT 521* 547* 599* 559*    Basic Metabolic Panel: Recent Labs  Lab 08/03/22 0605 08/05/22 0440 08/07/22 0711 08/09/22 0729  NA 134* 138 138 139  K 3.9 3.9 4.0 4.0  CL 104 106 106 106  CO2  25 25 25 24   GLUCOSE 91 101* 99 134*  BUN <5* <5* 6 5*  CREATININE 0.56* 0.61 0.57* 0.53*  CALCIUM 8.3* 8.6* 9.0 8.5*    GFR: Estimated Creatinine Clearance: 175.1 mL/min (A) (by C-G formula based on SCr of 0.53 mg/dL (L)).    Recent Results (from the past 240 hour(s))  Resp Panel by RT-PCR (Flu A&B, Covid) Anterior Nasal Swab     Status: None   Collection Time: 07/30/22 10:05 PM   Specimen: Anterior Nasal Swab  Result Value Ref Range Status   SARS Coronavirus 2 by RT PCR NEGATIVE NEGATIVE Final    Comment: (NOTE) SARS-CoV-2 target nucleic acids are NOT DETECTED.  The SARS-CoV-2 RNA is generally detectable in upper respiratory specimens during the acute phase of infection. The lowest concentration of SARS-CoV-2 viral copies this assay can detect is 138 copies/mL. A negative result does not preclude SARS-Cov-2 infection and should not be used as the sole basis for treatment or other patient management decisions. A negative result may occur with  improper specimen  collection/handling, submission of specimen other than nasopharyngeal swab, presence of viral mutation(s) within the areas targeted by this assay, and inadequate number of viral copies(<138 copies/mL). A negative result must be combined with clinical observations, patient history, and epidemiological information. The expected result is Negative.  Fact Sheet for Patients:  EntrepreneurPulse.com.au  Fact Sheet for Healthcare Providers:  IncredibleEmployment.be  This test is no t yet approved or cleared by the [place] FDA and  has been authorized for detection and/or diagnosis of SARS-CoV-2 by FDA under an Emergency Use Authorization (EUA). This EUA will remain  in effect (meaning this test can be used) for the duration of the COVID-19 declaration under Section 564(b)(1) of the Act, 21 U.S.C.section 360bbb-3(b)(1), unless the authorization is terminated  or revoked sooner.       Influenza A by PCR NEGATIVE NEGATIVE Final   Influenza B by PCR NEGATIVE NEGATIVE Final    Comment: (NOTE) The Xpert Xpress SARS-CoV-2/FLU/RSV plus assay is intended as an aid in the diagnosis of influenza from Nasopharyngeal swab specimens and should not be used as a sole basis for treatment. Nasal washings and aspirates are unacceptable for Xpert Xpress SARS-CoV-2/FLU/RSV testing.  Fact Sheet for Patients: EntrepreneurPulse.com.au  Fact Sheet for Healthcare Providers: IncredibleEmployment.be  This test is not yet approved or cleared by the [place] FDA and has been authorized for detection and/or diagnosis of SARS-CoV-2 by FDA under an Emergency Use Authorization (EUA). This EUA will remain in effect (meaning this test can be used) for the duration of the COVID-19 declaration under Section 564(b)(1) of the Act, 21 U.S.C. section 360bbb-3(b)(1), unless the authorization is terminated or revoked.  Performed at [facility], [place] [address] [place], [place] [address]   Blood culture (routine x 2)     Status: None   Collection Time: 07/31/22  2:30 AM   Specimen: BLOOD RIGHT HAND  Result Value Ref Range Status   Specimen Description BLOOD RIGHT HAND  Final   Special Requests   Final    BOTTLES DRAWN AEROBIC AND ANAEROBIC Blood Culture results may not be optimal due to an inadequate volume of blood received in culture bottles   Culture   Final    NO GROWTH 5 DAYS Performed at [facility], [place] [address], [place], [place] [address]    Report Status 08/05/2022 FINAL  Final  Blood culture (routine x 2)     Status:  None   Collection Time: 07/31/22  3:00 AM   Specimen: BLOOD  Result Value Ref Range Status   Specimen Description BLOOD RIGHT ANTECUBITAL  Final   Special Requests   Final    BOTTLES DRAWN AEROBIC AND ANAEROBIC Blood Culture adequate volume   Culture   Final    NO GROWTH 5 DAYS Performed at [facility], [address] [place], [place] [address]    Report Status 08/05/2022 FINAL  Final  Aerobic/Anaerobic Culture w Gram Stain (surgical/deep wound)     Status: None (Preliminary result)   Collection Time: 07/31/22  8:05 PM   Specimen: Soft Tissue, Other  Result Value Ref Range Status   Specimen Description WOUND  Final   Special Requests SWABS OF RIGHT CORONAL SUTURE MASS  Final   Gram Stain   Final    RARE WBC PRESENT, PREDOMINANTLY MONONUCLEAR NO ORGANISMS SEEN    Culture   Final    RARE PROPIONIBACTERIUM ACNES [facility] FOR SUSCEPTIBILITY Performed at [facility], [place] [address], [place], [place] [address]    Report Status PENDING  Incomplete  Aerobic/Anaerobic Culture w Gram Stain (surgical/deep wound)     Status: None   Collection Time: 07/31/22  8:16 PM   Specimen: Soft Tissue, Other  Result Value Ref Range Status   Specimen Description TISSUE  Final   Special Requests RT CORONAL SUTURE MASS  Final   Gram Stain NO WBC SEEN NO ORGANISMS  SEEN   Final   Culture   Final    RARE PROPIONIBACTERIUM ACNES Standardized susceptibility testing for this organism is not available. Performed at [facility], [place] [address] [place], [place] [address]    Report Status 08/05/2022 FINAL  Final  MRSA Next Gen by PCR, Nasal     Status: None   Collection Time: 08/01/22  2:15 AM   Specimen: Nasal Mucosa; Nasal Swab  Result Value Ref Range Status   MRSA by PCR Next Gen NOT DETECTED NOT DETECTED Final    Comment: (NOTE) The GeneXpert MRSA Assay (FDA approved for NASAL specimens only), is one component of a comprehensive MRSA colonization surveillance program. It is not intended to diagnose MRSA infection nor to guide or monitor treatment for MRSA infections. Test performance is not FDA approved in patients less than 66 years old. Performed at [facility], [place] [address], [place], [place] [address]   Acid Fast Smear (AFB)     Status: None   Collection Time: 08/01/22  9:01 AM   Specimen: Tissue  Result Value Ref Range Status   AFB Specimen Processing Concentration  Final   Acid Fast Smear Negative  Final    Comment: (NOTE) Performed At: [facility] [place], [place] [id number] Rush Farmer MD [id number]    Source (AFB) TISSUE  Final    Comment: Performed at [facility], [place] [address] [place], [place] [address]  Acid Fast Smear (AFB)     Status: None   Collection Time: 08/01/22  9:16 AM   Specimen: Tissue  Result Value Ref Range Status   AFB Specimen Processing Concentration  Final   Acid Fast Smear Negative  Final    Comment: (NOTE) Performed At: [facility] [place], [place] [id number] Rush Farmer MD [id number]    Source (AFB) TISSUE  Final    Comment: Performed at [facility], [place] [address], [place], [place] [address]  Culture, fungus without  smear     Status: None (Preliminary result)   Collection Time: 08/01/22  9:16 AM   Specimen:  Tissue; Other  Result Value Ref Range Status   Specimen Description TISSUE  Final   Special Requests NONE  Final   Culture   Final    NO FUNGUS ISOLATED AFTER 7 DAYS Performed at [facility], [address], [place], [place] [address]    Report Status PENDING  Incomplete  Culture, fungus without smear     Status: None (Preliminary result)   Collection Time: 08/01/22  9:30 AM   Specimen: Tissue  Result Value Ref Range Status   Specimen Description TISSUE  Final   Special Requests NONE  Final   Culture   Final    NO FUNGUS ISOLATED AFTER 7 DAYS Performed at [facility], [place] [address], [place],  [address]    Report Status PENDING  Incomplete  Aspergillus Ag, BAL/Serum     Status: None   Collection Time: 08/03/22  8:41 AM   Specimen: Vein; Blood  Result Value Ref Range Status   Aspergillus Ag, BAL/Serum 0.05 0.00 - 0.49 Index Final    Comment: (NOTE) Performed At: [facility] [place], [place] [id number] Rush Farmer MD [id number]       Radiology Studies: No results found.     LOS: 9 days   Evalynn Hankins [employer] on [email or web address]  08/09/2022, 10:09 AM

## 2022-08-09 NOTE — Plan of Care (Signed)
  Problem: Education: Goal: Knowledge of General Education information will improve Description: Including pain rating scale, medication(s)/side effects and non-pharmacologic comfort measures Outcome: Progressing   Problem: Health Behavior/Discharge Planning: Goal: Ability to manage health-related needs will improve Outcome: Progressing   Problem: Clinical Measurements: Goal: Ability to maintain clinical measurements within normal limits will improve Outcome: Progressing Goal: Will remain free from infection Outcome: Progressing Goal: Diagnostic test results will improve Outcome: Progressing Goal: Respiratory complications will improve Outcome: Completed/Met Goal: Cardiovascular complication will be avoided Outcome: Progressing   Problem: Activity: Goal: Risk for activity intolerance will decrease Outcome: Progressing   Problem: Nutrition: Goal: Adequate nutrition will be maintained Outcome: Completed/Met   Problem: Coping: Goal: Level of anxiety will decrease Outcome: Progressing   Problem: Elimination: Goal: Will not experience complications related to bowel motility Outcome: Progressing Goal: Will not experience complications related to urinary retention Outcome: Progressing   Problem: Pain Managment: Goal: General experience of comfort will improve Outcome: Progressing   Problem: Safety: Goal: Ability to remain free from injury will improve Outcome: Progressing   Problem: Education: Goal: Knowledge of the prescribed therapeutic regimen will improve Outcome: Progressing   Problem: Clinical Measurements: Goal: Usual level of consciousness will be regained or maintained. Outcome: Progressing Goal: Neurologic status will improve Outcome: Progressing Goal: Ability to maintain intracranial pressure will improve Outcome: Progressing   Problem: Skin Integrity: Goal: Demonstration of wound healing without infection will improve Outcome: Progressing

## 2022-08-09 NOTE — Progress Notes (Signed)
Peripherally Inserted Central Catheter Placement  The IV Nurse has discussed with the patient and/or persons authorized to consent for the patient, the purpose of this procedure and the potential benefits and risks involved with this procedure.  The benefits include less needle sticks, lab draws from the catheter, and the patient may be discharged home with the catheter. Risks include, but not limited to, infection, bleeding, blood clot (thrombus formation), and puncture of an artery; nerve damage and irregular heartbeat and possibility to perform a PICC exchange if needed/ordered by physician.  Alternatives to this procedure were also discussed.  Bard Power PICC patient education guide, fact sheet on infection prevention and patient information card has been provided to patient /or left at bedside.    PICC Placement Documentation  PICC Double Lumen 08/09/22 Right Basilic 40 cm 2 cm (Active)  Indication for Insertion or Continuance of Line Administration of hyperosmolar/irritating solutions (i.e. TPN, Vancomycin, etc.) 08/09/22 1453  Exposed Catheter (cm) 2 cm 08/09/22 1453  Site Assessment Clean, Dry, Intact 08/09/22 1453  Lumen #1 Status Flushed;Saline locked;Blood return noted 08/09/22 1453  Lumen #2 Status Flushed;Saline locked;No blood return 08/09/22 1453  Dressing Type Transparent;Securing device 08/09/22 1453  Dressing Status Antimicrobial disc in place;Clean, Dry, Intact 08/09/22 1453  Safety Lock Not Applicable 08/09/22 1453  Line Care Connections checked and tightened 08/09/22 1453  Line Adjustment (NICU/IV Team Only) No 08/09/22 1453  Dressing Intervention New dressing 08/09/22 1453  Dressing Change Due 08/16/22 08/09/22 1453       Devin Barker  [name] 08/09/2022, 2:55 PM

## 2022-08-10 ENCOUNTER — Inpatient Hospital Stay (HOSPITAL_COMMUNITY): Payer: BLUE CROSS/BLUE SHIELD

## 2022-08-10 NOTE — Progress Notes (Signed)
Central tele called pt had 4 beats of non sustained vtach HR was 111 and pairs of PVC. Seen pt lying on bed, no chest discomfort. Complained of headache, pain medicine given. On call provider made aware. EKG done.

## 2022-08-10 NOTE — Progress Notes (Signed)
[facility] PROGRESS NOTE   [name] BOF:[id number] DOB: 06-06-1996 DOA: 07/30/2022  PCP: Devin Barker, No Pcp Per  Brief History/Interval Summary: 26 year old male without significant past medical history who comes into the hospital with right-sided headache and vomiting.  Imaging in the emergency room shows soft tissue mass, skull defect with some erosion and dural enhancement, concerning for abscess.  Devin Barker's urine drug screen was positive for opioids, cocaine, amphetamines, THC.  He admits to shooting cocaine.  Devin Barker was hospitalized for further management.  Consultants: Neurosurgery.  Infectious disease  Procedures: Right craniectomy    Subjective/Interval History: Devin Barker mentions that he feels well.  PICC line was placed yesterday.  Denies any chest pain shortness of breath.      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 Franky Macho consulted, Devin Barker was taken to the OR on 11/13 status post right craniectomy for resection.  Operative note mention that purulent material was expressed -Staple removal per neurosurgery -ID consulted.  Devin Barker was initially on broad-spectrum antibiotics with vancomycin and cefepime.  Currently on continuous infusion of penicillin G.  -Gram stain was negative, follow cultures - preliminary surgical cultures showing 'PROPIONIBACTERIUM ACNES' - sensitivities pending -AFB and fungal stain pending, Fungitel negative. Seems to be stable from a neurological standpoint.  ID to determine final antibiotic plan.  Waiting on susceptibility testing for doxycycline. PICC line was placed yesterday.  Does not seem to be working normally per Education administrator.  Possible coiling in the right arm.  X-ray has been ordered.   Polysubstance abuse Pain-seeking behavior -UDS positive for opiates, cocaine, amphetamines, THC.  -Devin Barker has been complaining of transient pain in  the eye, chest, leg, and arm - these are fleeting events lasting maybe seconds to a minute. No IV narcotics to be given. Devin Barker would like the treatment team to refrain in discussing his polysubstance use with his family   Nausea, vomiting Resolved   Chronic thrombocytosis Continue to follow, stable near baseline   Tobacco abuse Continues to ask when he can start smoking again - we discussed ideally that would be never. Offered nicotine replacement  Class III obesity Estimated body mass index is 41.5 kg/m as calculated from the following:   Height as of this encounter: 5\' 7"  (1.702 m).   Weight as of this encounter: 120.2 kg.   DVT Prophylaxis: SCDs Code Status: Full code Family Communication: Discussed with Devin Barker Disposition Plan: Start mobilizing.  Status is: Inpatient Remains inpatient appropriate because: Brain abscess    Medications: Scheduled:  Chlorhexidine Gluconate Cloth  6 each Topical Daily   nicotine  7 mg Transdermal Daily   pantoprazole  40 mg Oral Daily   senna-docusate  1 tablet Oral BID   sodium chloride flush  10-40 mL Intracatheter Q12H   Continuous:  sodium chloride Stopped (08/10/22 0429)   penicillin G potassium 12 Million Units in dextrose 5 % 500 mL continuous infusion Stopped (08/10/22 0430)   08/12/22 **OR** [DISCONTINUED] acetaminophen, bisacodyl, guaiFENesin-dextromethorphan, HYDROcodone-acetaminophen, ibuprofen, labetalol, magnesium citrate, menthol-cetylpyridinium, naLOXone (NARCAN)  injection, ondansetron **OR** ondansetron (ZOFRAN) IV, mouth rinse, polyethylene glycol, promethazine, sodium chloride flush  Antibiotics: Anti-infectives (From admission, onward)    Start     Dose/Rate Route Frequency Ordered Stop   08/07/22 1800  penicillin G potassium 12 Million Units in dextrose 5 % 500 mL continuous infusion        12 Million Units 41.7 mL/hr over 12 Hours Intravenous Every 12 hours 08/07/22 1704  08/07/22 1300   penicillin G potassium 24 Million Units in dextrose 5 % 250 mL IVPB  Status:  Discontinued        24 Million Units 250 mL/hr over 60 Minutes Intravenous Every 24 hours 08/07/22 1146 08/07/22 1704   08/02/22 2200  vancomycin (VANCOREADY) IVPB 1750 mg/350 mL  Status:  Discontinued        1,750 mg 175 mL/hr over 120 Minutes Intravenous Every 8 hours 08/02/22 1434 08/02/22 1441   08/02/22 1530  vancomycin (VANCOREADY) IVPB 1750 mg/350 mL  Status:  Discontinued        1,750 mg 175 mL/hr over 120 Minutes Intravenous Every 8 hours 08/02/22 1441 08/04/22 0823   07/31/22 1200  vancomycin (VANCOREADY) IVPB 1250 mg/250 mL  Status:  Discontinued        1,250 mg 166.7 mL/hr over 90 Minutes Intravenous Every 8 hours 07/31/22 0352 08/02/22 1434   07/31/22 1200  ceFEPIme (MAXIPIME) 2 g in sodium chloride 0.9 % 100 mL IVPB  Status:  Discontinued        2 g 200 mL/hr over 30 Minutes Intravenous Every 8 hours 07/31/22 0352 08/07/22 1146   07/31/22 0400  metroNIDAZOLE (FLAGYL) IVPB 500 mg  Status:  Discontinued        500 mg 100 mL/hr over 60 Minutes Intravenous Every 6 hours 07/31/22 0329 08/07/22 1146   07/31/22 0245  vancomycin (VANCOREADY) IVPB 2000 mg/400 mL        2,000 mg 200 mL/hr over 120 Minutes Intravenous STAT 07/31/22 0230 07/31/22 0533   07/31/22 0245  ceFEPIme (MAXIPIME) 2 g in sodium chloride 0.9 % 100 mL IVPB        2 g 200 mL/hr over 30 Minutes Intravenous STAT 07/31/22 0230 07/31/22 0333   07/31/22 0215  vancomycin (VANCOCIN) IVPB 1000 mg/200 mL premix  Status:  Discontinued        1,000 mg 200 mL/hr over 60 Minutes Intravenous  Once 07/31/22 0214 07/31/22 0229       Objective:  Vital Signs  Vitals:   08/09/22 2142 08/09/22 2328 08/10/22 0353 08/10/22 0756  BP: (!) 109/54 (!) 112/53 (!) 116/56 (!) 121/51  Pulse: 84 81 83 87  Resp: 18 20 20 17   Temp: 97.7 F (36.5 C) 98.3 F (36.8 C) 97.9 F (36.6 C) 98 F (36.7 C)  TempSrc: Oral Oral Oral Oral  SpO2: 100% 97% 98% 99%   Weight:      Height:        Intake/Output Summary (Last 24 hours) at 08/10/2022 0944 Last data filed at 08/10/2022 0500 Gross per 24 hour  Intake 2306.53 ml  Output 1650 ml  Net 656.53 ml    Filed Weights   07/31/22 0300 08/08/22 0601  Weight: 122.5 kg 120.2 kg    General appearance: Awake alert.  In no distress Resp: Clear to auscultation bilaterally.  Normal effort Cardio: S1-S2 is normal regular.  No S3-S4.  No rubs murmurs or bruit GI: Abdomen is soft.  Nontender nondistended.  Bowel sounds are present normal.  No masses organomegaly Extremities: No edema.  Full range of motion of lower extremities. Neurologic: Alert and oriented x3.  No focal neurological deficits.    Lab Results:  Data Reviewed: I have personally reviewed following labs and reports of the imaging studies  CBC: Recent Labs  Lab 08/05/22 0440 08/07/22 0711 08/09/22 0729  WBC 12.9* 12.2* 13.1*  HGB 14.0 14.3 13.2  HCT 43.4 43.3 42.1  MCV 82.7 82.3 83.5  PLT 547* 599* 559*     Basic Metabolic Panel: Recent Labs  Lab 08/05/22 0440 08/07/22 0711 08/09/22 0729  NA 138 138 139  K 3.9 4.0 4.0  CL 106 106 106  CO2 25 25 24   GLUCOSE 101* 99 134*  BUN <5* 6 5*  CREATININE 0.61 0.57* 0.53*  CALCIUM 8.6* 9.0 8.5*     GFR: Estimated Creatinine Clearance: 175.1 mL/min (A) (by C-G formula based on SCr of 0.53 mg/dL (L)).    Recent Results (from the past 240 hour(s))  Aerobic/Anaerobic Culture w Gram Stain (surgical/deep wound)     Status: None (Preliminary result)   Collection Time: 07/31/22  8:05 PM   Specimen: Soft Tissue, Other  Result Value Ref Range Status   Specimen Description WOUND  Final   Special Requests SWABS OF RIGHT CORONAL SUTURE MASS  Final   Gram Stain   Final    RARE WBC PRESENT, PREDOMINANTLY MONONUCLEAR NO ORGANISMS SEEN    Culture   Final    RARE PROPIONIBACTERIUM ACNES [facility] FOR SUSCEPTIBILITY Performed at [facility], [place] [address], [place], [place] [address]    Report Status PENDING  Incomplete  Aerobic/Anaerobic Culture w Gram Stain (surgical/deep wound)     Status: None   Collection Time: 07/31/22  8:16 PM   Specimen: Soft Tissue, Other  Result Value Ref Range Status   Specimen Description TISSUE  Final   Special Requests RT CORONAL SUTURE MASS  Final   Gram Stain NO WBC SEEN NO ORGANISMS SEEN   Final   Culture   Final    RARE PROPIONIBACTERIUM ACNES Standardized susceptibility testing for this organism is not available. Performed at [facility], [place] [address] [place], [place] [address]    Report Status 08/05/2022 FINAL  Final  MRSA Next Gen by PCR, Nasal     Status: None   Collection Time: 08/01/22  2:15 AM   Specimen: Nasal Mucosa; Nasal Swab  Result Value Ref Range Status   MRSA by PCR Next Gen NOT DETECTED NOT DETECTED Final    Comment: (NOTE) The GeneXpert MRSA Assay (FDA approved for NASAL specimens only), is one component of a comprehensive MRSA colonization surveillance program. It is not intended to diagnose MRSA infection nor to guide or monitor treatment for MRSA infections. Test performance is not FDA approved in patients less than 70 years old. Performed at [facility], [place] [address], [place], [place] [address]   Acid Fast Smear (AFB)     Status: None   Collection Time: 08/01/22  9:01 AM   Specimen: Tissue  Result Value Ref Range Status   AFB Specimen Processing Concentration  Final   Acid Fast Smear Negative  Final    Comment: (NOTE) Performed At: [facility] [place], [place] [id number] Rush Farmer MD [id number]    Source (AFB) TISSUE  Final    Comment: Performed at [facility], [place] [address], [place], [place] [address]  Acid Fast Smear (AFB)     Status: None   Collection Time: 08/01/22  9:16 AM   Specimen: Tissue  Result Value Ref Range Status   AFB Specimen Processing Concentration  Final   Acid Fast Smear Negative  Final     Comment: (NOTE) Performed At: [facility] [place], [place] [id number] Rush Farmer MD [id number]    Source (AFB) TISSUE  Final    Comment: Performed at [facility],  [address] [place], [place] [address]  Culture, fungus without smear     Status: None (Preliminary result)   Collection Time: 08/01/22  9:16 AM   Specimen: Tissue; Other  Result Value Ref Range Status   Specimen Description TISSUE  Final   Special Requests NONE  Final   Culture   Final    NO FUNGUS ISOLATED AFTER 8 DAYS Performed at [facility], [address], [place], [place] [address]    Report Status PENDING  Incomplete  Culture, fungus without smear     Status: None (Preliminary result)   Collection Time: 08/01/22  9:30 AM   Specimen: Tissue  Result Value Ref Range Status   Specimen Description TISSUE  Final   Special Requests NONE  Final   Culture   Final    NO FUNGUS ISOLATED AFTER 8 DAYS Performed at [facility], [place] [address] [place], [place] [address]    Report Status PENDING  Incomplete  Aspergillus Ag, BAL/Serum     Status: None   Collection Time: 08/03/22  8:41 AM   Specimen: Vein; Blood  Result Value Ref Range Status   Aspergillus Ag, BAL/Serum 0.05 0.00 - 0.49 Index Final    Comment: (NOTE) Performed At: [facility] [address] [place], [place] [id number] Rush Farmer MD [id number]       Radiology Studies: DG CHEST PORT 1 VIEW  Result Date: 08/10/2022 CLINICAL DATA:  Status post PICC line placement. EXAM: PORTABLE CHEST 1 VIEW COMPARISON:  04/27/2017 FINDINGS: Right arm PICC line has been placed. The tip is in the region of the right innominate vein. Both lungs are clear. Negative for a pneumothorax. Trachea is midline. Heart and mediastinum are within normal limits. IMPRESSION: Right arm PICC line tip is in the region of the right innominate vein. Electronically Signed   By: Markus Daft M.D.   On: 08/10/2022 08:10    [place] EKG SITE RITE  Result Date: 08/09/2022 If Site Rite image not attached, placement could not be confirmed due to current cardiac rhythm.      LOS: 10 days   Nollie Shiflett [employer] on [email or web address]  08/10/2022, 9:44 AM

## 2022-08-10 NOTE — Progress Notes (Signed)
IV consult for RUA PICC potential problem. CXR done earlier and resulted that tip of R PICC is in the innominate vein. Spoke with RN regarding getting  XRAY on the R humerus to see if there is possible coiling of PICC in the area. Will follow up.

## 2022-08-10 NOTE — Progress Notes (Signed)
Peripherally Inserted Central Catheter Placement  The IV Nurse has discussed with the patient and/or persons authorized to consent for the patient, the purpose of this procedure and the potential benefits and risks involved with this procedure.  The benefits include less needle sticks, lab draws from the catheter, and the patient may be discharged home with the catheter. Risks include, but not limited to, infection, bleeding, blood clot (thrombus formation), and puncture of an artery; nerve damage and irregular heartbeat and possibility to perform a PICC exchange if needed/ordered by physician.  Alternatives to this procedure were also discussed.  Bard Power PICC patient education guide, fact sheet on infection prevention and patient information card has been provided to patient /or left at bedside.    PICC Placement Documentation  PICC Double Lumen 08/10/22 Left Basilic 47 cm 2 cm (Active)  Indication for Insertion or Continuance of Line Prolonged intravenous therapies 08/10/22 1233  Exposed Catheter (cm) 2 cm 08/10/22 1233  Site Assessment Clean, Dry, Intact 08/10/22 1233  Lumen #1 Status Flushed;Blood return noted 08/10/22 1233  Lumen #2 Status Flushed;Blood return noted 08/10/22 1233  Dressing Type Transparent 08/10/22 1233  Dressing Status Antimicrobial disc in place 08/10/22 1233  Line Care Connections checked and tightened 08/10/22 1233  Dressing Change Due 08/17/22 08/10/22 1233       Audrie Gallus 08/10/2022, 12:35 PM

## 2022-08-11 ENCOUNTER — Inpatient Hospital Stay: Payer: Self-pay

## 2022-08-11 LAB — BASIC METABOLIC PANEL
Anion gap: 9 (ref 5–15)
BUN: 6 mg/dL (ref 6–20)
CO2: 24 mmol/L (ref 22–32)
Calcium: 8.4 mg/dL — ABNORMAL LOW (ref 8.9–10.3)
Chloride: 105 mmol/L (ref 98–111)
Creatinine, Ser: 0.63 mg/dL (ref 0.61–1.24)
GFR, Estimated: >60 mL/min (ref >60)
Glucose, Bld: 111 mg/dL — ABNORMAL HIGH (ref 70–99)
Potassium: 4 mmol/L (ref 3.5–5.1)
Sodium: 138 mmol/L (ref 135–145)

## 2022-08-11 LAB — CBC
HCT: 42.1 % (ref 39.0–52.0)
Hemoglobin: 13.8 g/dL (ref 13.0–17.0)
MCH: 27 pg (ref 26.0–34.0)
MCHC: 32.8 g/dL (ref 30.0–36.0)
MCV: 82.2 fL (ref 80.0–100.0)
Platelets: 558 10*3/uL — ABNORMAL HIGH (ref 150–400)
RBC: 5.12 MIL/uL (ref 4.22–5.81)
RDW: 14.2 % (ref 11.5–15.5)
WBC: 15.8 10*3/uL — ABNORMAL HIGH (ref 4.0–10.5)
nRBC: 0 % (ref 0.0–0.2)

## 2022-08-11 LAB — MAGNESIUM: Magnesium: 2 mg/dL (ref 1.7–2.4)

## 2022-08-11 MED ORDER — FLUVOXAMINE MALEATE 50 MG PO TABS
50.0000 mg | ORAL_TABLET | Freq: Every morning | ORAL | Status: DC
  Administered 2022-08-11 – 2022-08-20 (×10): 50 mg via ORAL
  Filled 2022-08-11 (×10): qty 1

## 2022-08-11 MED ORDER — HYDROXYZINE HCL 25 MG PO TABS: 25.0000 mg | ORAL_TABLET | Freq: Two times a day (BID) | ORAL | Status: DC | PRN

## 2022-08-11 MED ORDER — HYDROXYZINE HCL 25 MG PO TABS
25.0000 mg | ORAL_TABLET | ORAL | Status: AC
  Administered 2022-08-11: 25 mg via ORAL
  Filled 2022-08-11: qty 1

## 2022-08-11 MED ORDER — TRAZODONE HCL 50 MG PO TABS
50.0000 mg | ORAL_TABLET | ORAL | Status: AC
  Filled 2022-08-11: qty 1

## 2022-08-11 MED ORDER — AMPHETAMINE-DEXTROAMPHETAMINE 10 MG PO TABS
20.0000 mg | ORAL_TABLET | Freq: Two times a day (BID) | ORAL | Status: DC
  Administered 2022-08-11 – 2022-08-12 (×2): 20 mg via ORAL
  Filled 2022-08-11 (×3): qty 2

## 2022-08-11 MED ORDER — CARBAMAZEPINE ER 200 MG PO TB12
400.0000 mg | ORAL_TABLET | Freq: Every day | ORAL | Status: DC
  Administered 2022-08-11 – 2022-08-22 (×12): 400 mg via ORAL
  Filled 2022-08-11 (×12): qty 2

## 2022-08-11 NOTE — Progress Notes (Signed)
Pt have powdered tobacco or "traditional tobacco" per patient at bedside. Explained that he is not allowed to use it. Verbalize understanding and agreed to throw it away.

## 2022-08-11 NOTE — Plan of Care (Signed)
  Problem: Safety: Goal: Ability to remain free from injury will improve Outcome: Progressing   

## 2022-08-11 NOTE — Progress Notes (Addendum)
[facility] PROGRESS NOTE   Devin Barker ZJI:[id number] DOB: Apr 20, 1996 DOA: 07/30/2022  PCP: Patient, No Pcp Per  Brief History/Interval Summary: 26 year old male without significant past medical history who comes into the hospital with right-sided headache and vomiting.  Imaging in the emergency room shows soft tissue mass, skull defect with some erosion and dural enhancement, concerning for abscess.  Patient's urine drug screen was positive for opioids, cocaine, amphetamines, THC.  He admits to shooting cocaine.  Patient was hospitalized for further management.  Consultants: Neurosurgery.  Infectious disease  Procedures: Right craniectomy    Subjective/Interval History: Patient denies any complaints.  He wants his home medications to be resumed.    Assessment/Plan:  Intracranial abscess/temporalis infection -Dr. Franky Macho consulted, patient was taken to the OR on 11/13 status post right craniectomy for resection.  Operative note mention that purulent material was expressed -Staple removal per neurosurgery -ID consulted.  Patient was initially on broad-spectrum antibiotics with vancomycin and cefepime.  Currently on continuous infusion of penicillin G.  -Gram stain was negative, follow cultures - preliminary surgical cultures showing 'PROPIONIBACTERIUM ACNES' - sensitivities pending -AFB and fungal stain pending, Fungitel negative. Seems to be stable from a neurological standpoint.  ID to determine final antibiotic plan.  Waiting on susceptibility testing for doxycycline. PICC line has been placed.   Polysubstance abuse Pain-seeking behavior -UDS positive for opiates, cocaine, amphetamines, THC.  -Patient has been complaining of transient pain in the eye, chest, leg, and arm - these are fleeting events lasting maybe seconds to a minute. No IV narcotics to be given. Patient would like the treatment team to refrain in discussing his polysubstance use with  his family   NSVT Brief run of NSVT noted on telemetry.  Electrolytes reviewed.  Magnesium 2.0.  Potassium is 4.0.  Patient asymptomatic.  Continue to monitor on telemetry for now.  Nausea, vomiting Resolved   Chronic thrombocytosis Continue to follow, stable near baseline   Tobacco abuse Counseled.  Class III obesity Estimated body mass index is 41.5 kg/m as calculated from the following:   Height as of this encounter: 5\' 7"  (1.702 m).   Weight as of this encounter: 120.2 kg.   DVT Prophylaxis: SCDs Code Status: Full code Family Communication: Discussed with patient Disposition Plan: Continue to mobilize.  Status is: Inpatient Remains inpatient appropriate because: Brain abscess    Medications: Scheduled:  amphetamine-dextroamphetamine  20 mg Oral BID   carbamazepine  400 mg Oral Daily   Chlorhexidine Gluconate Cloth  6 each Topical Daily   fluvoxaMINE  50 mg Oral q AM   nicotine  7 mg Transdermal Daily   pantoprazole  40 mg Oral Daily   senna-docusate  1 tablet Oral BID   sodium chloride flush  10-40 mL Intracatheter Q12H   Continuous:  sodium chloride 100 mL/hr at 08/11/22 0028   penicillin G potassium 12 Million Units in dextrose 5 % 500 mL continuous infusion 12 Million Units (08/11/22 0224)   08/13/22 **OR** [DISCONTINUED] acetaminophen, bisacodyl, guaiFENesin-dextromethorphan, HYDROcodone-acetaminophen, hydrOXYzine, ibuprofen, labetalol, magnesium citrate, menthol-cetylpyridinium, naLOXone (NARCAN)  injection, ondansetron **OR** ondansetron (ZOFRAN) IV, mouth rinse, polyethylene glycol, promethazine, sodium chloride flush  Antibiotics: Anti-infectives (From admission, onward)    Start     Dose/Rate Route Frequency Ordered Stop   08/07/22 1800  penicillin G potassium 12 Million Units in dextrose 5 % 500 mL continuous infusion        12 Million Units 41.7 mL/hr over 12 Hours Intravenous Every 12 hours 08/07/22 1704  08/07/22 1300  penicillin G  potassium 24 Million Units in dextrose 5 % 250 mL IVPB  Status:  Discontinued        24 Million Units 250 mL/hr over 60 Minutes Intravenous Every 24 hours 08/07/22 1146 08/07/22 1704   08/02/22 2200  vancomycin (VANCOREADY) IVPB 1750 mg/350 mL  Status:  Discontinued        1,750 mg 175 mL/hr over 120 Minutes Intravenous Every 8 hours 08/02/22 1434 08/02/22 1441   08/02/22 1530  vancomycin (VANCOREADY) IVPB 1750 mg/350 mL  Status:  Discontinued        1,750 mg 175 mL/hr over 120 Minutes Intravenous Every 8 hours 08/02/22 1441 08/04/22 0823   07/31/22 1200  vancomycin (VANCOREADY) IVPB 1250 mg/250 mL  Status:  Discontinued        1,250 mg 166.7 mL/hr over 90 Minutes Intravenous Every 8 hours 07/31/22 0352 08/02/22 1434   07/31/22 1200  ceFEPIme (MAXIPIME) 2 g in sodium chloride 0.9 % 100 mL IVPB  Status:  Discontinued        2 g 200 mL/hr over 30 Minutes Intravenous Every 8 hours 07/31/22 0352 08/07/22 1146   07/31/22 0400  metroNIDAZOLE (FLAGYL) IVPB 500 mg  Status:  Discontinued        500 mg 100 mL/hr over 60 Minutes Intravenous Every 6 hours 07/31/22 0329 08/07/22 1146   07/31/22 0245  vancomycin (VANCOREADY) IVPB 2000 mg/400 mL        2,000 mg 200 mL/hr over 120 Minutes Intravenous STAT 07/31/22 0230 07/31/22 0533   07/31/22 0245  ceFEPIme (MAXIPIME) 2 g in sodium chloride 0.9 % 100 mL IVPB        2 g 200 mL/hr over 30 Minutes Intravenous STAT 07/31/22 0230 07/31/22 0333   07/31/22 0215  vancomycin (VANCOCIN) IVPB 1000 mg/200 mL premix  Status:  Discontinued        1,000 mg 200 mL/hr over 60 Minutes Intravenous  Once 07/31/22 0214 07/31/22 0229       Objective:  Vital Signs  Vitals:   08/10/22 2010 08/10/22 2330 08/11/22 0400 08/11/22 0812  BP: 116/68 112/77 128/72 112/86  Pulse: (!) 104 90 75 74  Resp: 20 (!) 22 20 17   Temp: 97.6 F (36.4 C) 97.9 F (36.6 C) (!) 97.5 F (36.4 C) 97.7 F (36.5 C)  TempSrc: Oral Oral Oral Oral  SpO2: 99% 98% 97% 95%  Weight:       Height:        Intake/Output Summary (Last 24 hours) at 08/11/2022 1010 Last data filed at 08/10/2022 2200 Gross per 24 hour  Intake 636.85 ml  Output --  Net 636.85 ml    Filed Weights   07/31/22 0300 08/08/22 0601  Weight: 122.5 kg 120.2 kg    General appearance: Awake alert.  In no distress Resp: Clear to auscultation bilaterally.  Normal effort Cardio: S1-S2 is normal regular.  No S3-S4.  No rubs murmurs or bruit GI: Abdomen is soft.  Nontender nondistended.  Bowel sounds are present normal.  No masses organomegaly Extremities: No edema.  Full range of motion of lower extremities.    Lab Results:  Data Reviewed: I have personally reviewed following labs and reports of the imaging studies  CBC: Recent Labs  Lab 08/05/22 0440 08/07/22 0711 08/09/22 0729 08/11/22 0500  WBC 12.9* 12.2* 13.1* 15.8*  HGB 14.0 14.3 13.2 13.8  HCT 43.4 43.3 42.1 42.1  MCV 82.7 82.3 83.5 82.2  PLT 547* 599* 559* 558*  Basic Metabolic Panel: Recent Labs  Lab 08/05/22 0440 08/07/22 0711 08/09/22 0729 08/11/22 0500  NA 138 138 139 138  K 3.9 4.0 4.0 4.0  CL 106 106 106 105  CO2 25 25 24 24   GLUCOSE 101* 99 134* 111*  BUN <5* 6 5* 6  CREATININE 0.61 0.57* 0.53* 0.63  CALCIUM 8.6* 9.0 8.5* 8.4*  MG  --   --   --  2.0     GFR: Estimated Creatinine Clearance: 175.1 mL/min (by C-G formula based on SCr of 0.63 mg/dL).    Recent Results (from the past 240 hour(s))  Aspergillus Ag, BAL/Serum     Status: None   Collection Time: 08/03/22  8:41 AM   Specimen: Vein; Blood  Result Value Ref Range Status   Aspergillus Ag, BAL/Serum 0.05 0.00 - 0.49 Index Final    Comment: (NOTE) Performed At: [facility] [address] [place], [place] [id number] Rush Farmer MD [id number]       Radiology Studies: [place] EKG SITE RITE  Result Date: 08/10/2022 If Site Rite image not attached, placement could not be confirmed due to current cardiac rhythm.  DG Humerus  Right  Result Date: 08/10/2022 CLINICAL DATA:  Encounter for assessment of peripherally inserted central venous catheter. EXAM: RIGHT HUMERUS - 2+ VIEW COMPARISON:  Chest radiograph 08/10/2022 FINDINGS: Right arm PICC line has a very tortuous course in the mid humeral region. PICC line extends into the right upper chest. Right humerus is intact. No gross bone abnormality at the shoulder or elbow. IMPRESSION: PICC line has a tortuous course in the mid upper arm. Based on the recent chest radiograph findings, recommend PICC line exchange or revision. Electronically Signed   By: Markus Daft M.D.   On: 08/10/2022 09:44   DG CHEST PORT 1 VIEW  Result Date: 08/10/2022 CLINICAL DATA:  Status post PICC line placement. EXAM: PORTABLE CHEST 1 VIEW COMPARISON:  04/27/2017 FINDINGS: Right arm PICC line has been placed. The tip is in the region of the right innominate vein. Both lungs are clear. Negative for a pneumothorax. Trachea is midline. Heart and mediastinum are within normal limits. IMPRESSION: Right arm PICC line tip is in the region of the right innominate vein. Electronically Signed   By: Markus Daft M.D.   On: 08/10/2022 08:10   [place] EKG SITE RITE  Result Date: 08/09/2022 If Site Rite image not attached, placement could not be confirmed due to current cardiac rhythm.      LOS: 11 days   Devin Barker [employer] on [email or web address]  08/11/2022, 10:10 AM

## 2022-08-11 NOTE — TOC Progression Note (Signed)
Transition of Care [facility]) - Progression Note    Patient Details  Name: Devin Barker MRN: [id number] Date of Birth: 06-28-1996  Transition of Care [facility]) CM/SW Contact  Kermit Balo, RN Phone Number: 08/11/2022, 1:04 PM  Clinical Narrative:    Continues with IV abx. Not a candidate for home IV abx.  TOC following.   Expected Discharge Plan: Home/Self Care Barriers to Discharge: Continued Medical Work up  Expected Discharge Plan and Services Expected Discharge Plan: Home/Self Care   Discharge Planning Services: CM Consult   Living arrangements for the past 2 months: Single Family Home                                       Social Determinants of Health (SDOH) Interventions    Readmission Risk Interventions     No data to display

## 2022-08-12 MED ORDER — ALPRAZOLAM 0.25 MG PO TABS
0.2500 mg | ORAL_TABLET | Freq: Two times a day (BID) | ORAL | Status: DC | PRN
  Administered 2022-08-12 – 2022-08-22 (×20): 0.25 mg via ORAL
  Filled 2022-08-12 (×20): qty 1

## 2022-08-12 MED ORDER — AMPHETAMINE-DEXTROAMPHETAMINE 10 MG PO TABS
10.0000 mg | ORAL_TABLET | Freq: Two times a day (BID) | ORAL | Status: DC
  Administered 2022-08-13: 10 mg via ORAL
  Filled 2022-08-12 (×2): qty 1

## 2022-08-12 MED ORDER — POLYVINYL ALCOHOL 1.4 % OP SOLN
1.0000 [drp] | OPHTHALMIC | Status: DC | PRN
  Administered 2022-08-14 – 2022-08-15 (×2): 1 [drp] via OPHTHALMIC
  Filled 2022-08-12: qty 15

## 2022-08-12 NOTE — Progress Notes (Signed)
[facility] PROGRESS NOTE   Devin Barker KMM:[id number] DOB: [birth year] DOA: 07/30/2022  PCP: Patient, No Pcp Per  Brief History/Interval Summary: 26 year old male without significant past medical history who comes into the hospital with right-sided headache and vomiting.  Imaging in the emergency room shows soft tissue mass, skull defect with some erosion and dural enhancement, concerning for abscess.  Patient's urine drug screen was positive for opioids, cocaine, amphetamines, THC.  He admits to shooting cocaine.  Patient was hospitalized for further management.  Consultants: Neurosurgery.  Infectious disease  Procedures: Right craniectomy    Subjective/Interval History: Complains of feeling anxious this morning.  Does not want to continue hydroxyzine anymore.  Denies any other complaints at this time.    Assessment/Plan:  Intracranial abscess/temporalis infection -Dr. Franky Macho consulted, patient was taken to the OR on 11/13 status post right craniectomy for resection.  Operative note mention that purulent material was expressed -Staple removal per neurosurgery -ID consulted.  Patient was initially on broad-spectrum antibiotics with vancomycin and cefepime.  Currently on continuous infusion of penicillin G.  -Gram stain was negative, follow cultures - preliminary surgical cultures showing 'PROPIONIBACTERIUM ACNES' - sensitivities pending -AFB and fungal stain pending, Fungitel negative. Seems to be stable from a neurological standpoint.  ID to determine final antibiotic plan.  Waiting on susceptibility testing for doxycycline. PICC line has been placed.   Polysubstance abuse -UDS positive for opiates, cocaine, amphetamines, THC.  -Patient has been complaining of transient pain in the eye, chest, leg, and arm - these are fleeting events lasting maybe seconds to a minute. No IV narcotics to be given. Patient would like the treatment team to refrain in  discussing his polysubstance use with his family   NSVT Brief run of NSVT noted on telemetry.  Electrolytes reviewed.  Magnesium 2.0.  Potassium is 4.0.  Patient asymptomatic.  Continue to monitor on telemetry for now.  Nausea, vomiting Resolved   Chronic thrombocytosis Continue to follow, stable near baseline   Tobacco abuse Counseled.  Anxiety Alprazolam as needed.  Class III obesity Estimated body mass index is 41.5 kg/m as calculated from the following:   Height as of this encounter: 5\' 7"  (1.702 m).   Weight as of this encounter: 120.2 kg.   DVT Prophylaxis: SCDs Code Status: Full code Family Communication: Discussed with patient Disposition Plan: Continue to mobilize.  Status is: Inpatient Remains inpatient appropriate because: Brain abscess    Medications: Scheduled:  amphetamine-dextroamphetamine  20 mg Oral BID   carbamazepine  400 mg Oral Daily   Chlorhexidine Gluconate Cloth  6 each Topical Daily   fluvoxaMINE  50 mg Oral q AM   nicotine  7 mg Transdermal Daily   pantoprazole  40 mg Oral Daily   senna-docusate  1 tablet Oral BID   sodium chloride flush  10-40 mL Intracatheter Q12H   traZODone  50 mg Oral STAT   Continuous:  sodium chloride 100 mL/hr at 08/12/22 0000   penicillin G potassium 12 Million Units in dextrose 5 % 500 mL continuous infusion 12 Million Units (08/12/22 0119)   BXU:XYBFXOVANVBTY **OR** [DISCONTINUED] acetaminophen, ALPRAZolam, bisacodyl, guaiFENesin-dextromethorphan, HYDROcodone-acetaminophen, ibuprofen, labetalol, magnesium citrate, menthol-cetylpyridinium, naLOXone (NARCAN)  injection, ondansetron **OR** ondansetron (ZOFRAN) IV, mouth rinse, polyethylene glycol, promethazine, sodium chloride flush  Antibiotics: Anti-infectives (From admission, onward)    Start     Dose/Rate Route Frequency Ordered Stop   08/07/22 1800  penicillin G potassium 12 Million Units in dextrose 5 % 500 mL continuous infusion  12 Million  Units 41.7 mL/hr over 12 Hours Intravenous Every 12 hours 08/07/22 1704     08/07/22 1300  penicillin G potassium 24 Million Units in dextrose 5 % 250 mL IVPB  Status:  Discontinued        24 Million Units 250 mL/hr over 60 Minutes Intravenous Every 24 hours 08/07/22 1146 08/07/22 1704   08/02/22 2200  vancomycin (VANCOREADY) IVPB 1750 mg/350 mL  Status:  Discontinued        1,750 mg 175 mL/hr over 120 Minutes Intravenous Every 8 hours 08/02/22 1434 08/02/22 1441   08/02/22 1530  vancomycin (VANCOREADY) IVPB 1750 mg/350 mL  Status:  Discontinued        1,750 mg 175 mL/hr over 120 Minutes Intravenous Every 8 hours 08/02/22 1441 08/04/22 0823   07/31/22 1200  vancomycin (VANCOREADY) IVPB 1250 mg/250 mL  Status:  Discontinued        1,250 mg 166.7 mL/hr over 90 Minutes Intravenous Every 8 hours 07/31/22 0352 08/02/22 1434   07/31/22 1200  ceFEPIme (MAXIPIME) 2 g in sodium chloride 0.9 % 100 mL IVPB  Status:  Discontinued        2 g 200 mL/hr over 30 Minutes Intravenous Every 8 hours 07/31/22 0352 08/07/22 1146   07/31/22 0400  metroNIDAZOLE (FLAGYL) IVPB 500 mg  Status:  Discontinued        500 mg 100 mL/hr over 60 Minutes Intravenous Every 6 hours 07/31/22 0329 08/07/22 1146   07/31/22 0245  vancomycin (VANCOREADY) IVPB 2000 mg/400 mL        2,000 mg 200 mL/hr over 120 Minutes Intravenous STAT 07/31/22 0230 07/31/22 0533   07/31/22 0245  ceFEPIme (MAXIPIME) 2 g in sodium chloride 0.9 % 100 mL IVPB        2 g 200 mL/hr over 30 Minutes Intravenous STAT 07/31/22 0230 07/31/22 0333   07/31/22 0215  vancomycin (VANCOCIN) IVPB 1000 mg/200 mL premix  Status:  Discontinued        1,000 mg 200 mL/hr over 60 Minutes Intravenous  Once 07/31/22 0214 07/31/22 0229       Objective:  Vital Signs  Vitals:   08/11/22 1130 08/11/22 1538 08/11/22 2058 08/12/22 0758  BP: (!) 143/68 (!) 148/83 136/79 114/78  Pulse: 71 87 85 74  Resp: 17 17 18 18   Temp: 98.3 F (36.8 C) 98.5 F (36.9 C) 98.6 F  (37 C) 98.3 F (36.8 C)  TempSrc: Oral Oral Oral Oral  SpO2: 100% 97% 99% 96%  Weight:      Height:        Intake/Output Summary (Last 24 hours) at 08/12/2022 0939 Last data filed at 08/12/2022 0000 Gross per 24 hour  Intake 5555.99 ml  Output 400 ml  Net 5155.99 ml    Filed Weights   07/31/22 0300 08/08/22 0601  Weight: 122.5 kg 120.2 kg   Patient is awake alert.  In no distress Lungs are clear to auscultation bilaterally. S1-S2 is normal regular.  Telemetry was reviewed.  episode of SVT noted.  Previously he has had nonsustained ventricular tachycardia as well.     Lab Results:  Data Reviewed: I have personally reviewed following labs and reports of the imaging studies  CBC: Recent Labs  Lab 08/07/22 0711 08/09/22 0729 08/11/22 0500  WBC 12.2* 13.1* 15.8*  HGB 14.3 13.2 13.8  HCT 43.3 42.1 42.1  MCV 82.3 83.5 82.2  PLT 599* 559* 558*     Basic Metabolic Panel: Recent Labs  Lab  08/07/22 0711 08/09/22 0729 08/11/22 0500  NA 138 139 138  K 4.0 4.0 4.0  CL 106 106 105  CO2 25 24 24   GLUCOSE 99 134* 111*  BUN 6 5* 6  CREATININE 0.57* 0.53* 0.63  CALCIUM 9.0 8.5* 8.4*  MG  --   --  2.0     GFR: Estimated Creatinine Clearance: 175.1 mL/min (by C-G formula based on SCr of 0.63 mg/dL).    Recent Results (from the past 240 hour(s))  Aspergillus Ag, BAL/Serum     Status: None   Collection Time: 08/03/22  8:41 AM   Specimen: Vein; Blood  Result Value Ref Range Status   Aspergillus Ag, BAL/Serum 0.05 0.00 - 0.49 Index Final    Comment: (NOTE) Performed At: [facility] [address] [place], [place] [id number] MD Jolene Schimke       Radiology Studies: [id number] EKG SITE RITE  Result Date: 08/10/2022 If Site Rite image not attached, placement could not be confirmed due to current cardiac rhythm.      LOS: 12 days   Eryca Bolte 08/12/2022 on [email or web address]  08/12/2022, 9:39 AM

## 2022-08-12 NOTE — Plan of Care (Signed)
  Problem: Education: Goal: Knowledge of General Education information will improve Description: Including pain rating scale, medication(s)/side effects and non-pharmacologic comfort measures Outcome: Progressing   Problem: Health Behavior/Discharge Planning: Goal: Ability to manage health-related needs will improve Outcome: Progressing   Problem: Clinical Measurements: Goal: Ability to maintain clinical measurements within normal limits will improve Outcome: Progressing Goal: Will remain free from infection Outcome: Progressing Goal: Diagnostic test results will improve Outcome: Progressing Goal: Cardiovascular complication will be avoided Outcome: Progressing   Problem: Activity: Goal: Risk for activity intolerance will decrease Outcome: Progressing   Problem: Coping: Goal: Level of anxiety will decrease Outcome: Progressing   Problem: Elimination: Goal: Will not experience complications related to bowel motility Outcome: Progressing Goal: Will not experience complications related to urinary retention Outcome: Progressing   Problem: Pain Managment: Goal: General experience of comfort will improve Outcome: Progressing   Problem: Safety: Goal: Ability to remain free from injury will improve Outcome: Progressing   Problem: Skin Integrity: Goal: Risk for impaired skin integrity will decrease Outcome: Progressing   Problem: Clinical Measurements: Goal: Usual level of consciousness will be regained or maintained. Outcome: Progressing Goal: Neurologic status will improve Outcome: Progressing Goal: Ability to maintain intracranial pressure will improve Outcome: Progressing   Problem: Skin Integrity: Goal: Demonstration of wound healing without infection will improve Outcome: Progressing

## 2022-08-12 NOTE — Plan of Care (Signed)
  Problem: Education: Goal: Knowledge of General Education information will improve Description: Including pain rating scale, medication(s)/side effects and non-pharmacologic comfort measures Outcome: Progressing   Problem: Health Behavior/Discharge Planning: Goal: Ability to manage health-related needs will improve Outcome: Progressing   Problem: Clinical Measurements: Goal: Ability to maintain clinical measurements within normal limits will improve Outcome: Progressing Goal: Will remain free from infection Outcome: Progressing Goal: Diagnostic test results will improve Outcome: Progressing Goal: Cardiovascular complication will be avoided Outcome: Progressing   Problem: Activity: Goal: Risk for activity intolerance will decrease Outcome: Progressing   Problem: Coping: Goal: Level of anxiety will decrease Outcome: Progressing   Problem: Elimination: Goal: Will not experience complications related to bowel motility Outcome: Progressing Goal: Will not experience complications related to urinary retention Outcome: Progressing   Problem: Pain Managment: Goal: General experience of comfort will improve Outcome: Progressing   Problem: Safety: Goal: Ability to remain free from injury will improve Outcome: Progressing   Problem: Skin Integrity: Goal: Risk for impaired skin integrity will decrease Outcome: Progressing   Problem: Education: Goal: Knowledge of the prescribed therapeutic regimen will improve Outcome: Progressing   Problem: Clinical Measurements: Goal: Usual level of consciousness will be regained or maintained. Outcome: Progressing Goal: Neurologic status will improve Outcome: Progressing Goal: Ability to maintain intracranial pressure will improve Outcome: Progressing   Problem: Skin Integrity: Goal: Demonstration of wound healing without infection will improve Outcome: Progressing

## 2022-08-12 NOTE — Progress Notes (Signed)
Pt reports new onset pain/pressure behind his left eye for less than 1 hour. He denies vision changes or headache. Pupils = 4, equal, round, brisk. Dr. Margo Aye notified.

## 2022-08-13 LAB — BASIC METABOLIC PANEL
Anion gap: 8 (ref 5–15)
BUN: 5 mg/dL — ABNORMAL LOW (ref 6–20)
CO2: 26 mmol/L (ref 22–32)
Calcium: 8.8 mg/dL — ABNORMAL LOW (ref 8.9–10.3)
Chloride: 103 mmol/L (ref 98–111)
Creatinine, Ser: 0.71 mg/dL (ref 0.61–1.24)
GFR, Estimated: >60 mL/min (ref >60)
Glucose, Bld: 97 mg/dL (ref 70–99)
Potassium: 3.9 mmol/L (ref 3.5–5.1)
Sodium: 137 mmol/L (ref 135–145)

## 2022-08-13 LAB — MAGNESIUM: Magnesium: 2 mg/dL (ref 1.7–2.4)

## 2022-08-13 MED ORDER — PENICILLIN G POTASSIUM 20000000 UNITS IJ SOLR
4.0000 10*6.[IU] | INTRAVENOUS | Status: DC
  Administered 2022-08-13 – 2022-08-22 (×54): 4 10*6.[IU] via INTRAVENOUS
  Filled 2022-08-13 (×62): qty 4

## 2022-08-13 MED ORDER — POTASSIUM CHLORIDE CRYS ER 20 MEQ PO TBCR
40.0000 meq | EXTENDED_RELEASE_TABLET | Freq: Once | ORAL | Status: AC
  Administered 2022-08-13: 40 meq via ORAL
  Filled 2022-08-13: qty 2

## 2022-08-13 NOTE — Progress Notes (Addendum)
[facility] PROGRESS NOTE   Devin Barker [id number] DOB: Jan 22, 1996 DOA: 07/30/2022  PCP: Patient, No Pcp Per  Brief History/Interval Summary: 26 year old male without significant past medical history who comes into the hospital with right-sided headache and vomiting.  Imaging in the emergency room shows soft tissue mass, skull defect with some erosion and dural enhancement, concerning for abscess.  Patient's urine drug screen was positive for opioids, cocaine, amphetamines, THC.  He admits to shooting cocaine.  Patient was hospitalized for further management.  Consultants: Neurosurgery.  Infectious disease  Procedures: Right craniectomy    Subjective/Interval History: Noted to be fidgety and anxious.  Upon further questioning he mentions that he has not been taking Adderall for more than 3 months.  Discussed that we should probably discontinue this medication.  After a lot of discussion he agreed.  No other complaints offered.      Assessment/Plan:  Intracranial abscess/temporalis infection -Dr. Christella Noa consulted, patient was taken to the OR on 11/13 status post right craniectomy for resection.  Operative note mention that purulent material was expressed -Staple removal per neurosurgery -ID consulted.  Patient was initially on broad-spectrum antibiotics with vancomycin and cefepime.  Currently on continuous infusion of penicillin G.  -Gram stain was negative, follow cultures - preliminary surgical cultures showing 'PROPIONIBACTERIUM ACNES' - sensitivities pending -AFB and fungal stain pending, Fungitel negative. Seems to be stable from a neurological standpoint.  ID to determine final antibiotic plan.  Waiting on susceptibility testing for doxycycline. PICC line has been placed.   Polysubstance abuse -UDS positive for opiates, cocaine, amphetamines, THC.  -Patient has been complaining of transient pain in the eye, chest, leg, and arm - these are  fleeting events lasting maybe seconds to a minute. No IV narcotics to be given. Patient would like the treatment team to refrain in discussing his polysubstance use with his family   NSVT Brief run of NSVT noted on telemetry.  Electrolytes reviewed.  Magnesium 2.0.  Potassium is 4.0.  No further episodes noted.  Okay to discontinue telemetry.  Nausea, vomiting Resolved   Chronic thrombocytosis Continue to follow, stable near baseline   Tobacco abuse Counseled.  History of ADHD/anxiety Patient was started back on Adderall at his request.  However upon further questioning it appears that he has not taken this medication in 3 months.  Noted to be fidgety and anxious which could be from the Adderall.  This will be discontinued.   He continues to be on Xanax as needed. Noted to be on fluvoxamine as well.    Class III obesity Estimated body mass index is 42.57 kg/m as calculated from the following:   Height as of this encounter: 5\' 7"  (1.702 m).   Weight as of this encounter: 123.3 kg.   DVT Prophylaxis: SCDs Code Status: Full code Family Communication: Discussed with patient Disposition Plan: Continue to mobilize.  Status is: Inpatient Remains inpatient appropriate because: Brain abscess    Medications: Scheduled:  carbamazepine  400 mg Oral Daily   Chlorhexidine Gluconate Cloth  6 each Topical Daily   fluvoxaMINE  50 mg Oral q AM   nicotine  7 mg Transdermal Daily   pantoprazole  40 mg Oral Daily   senna-docusate  1 tablet Oral BID   sodium chloride flush  10-40 mL Intracatheter Q12H   Continuous:  sodium chloride 100 mL/hr at 08/13/22 0000   penicillin G potassium 12 Million Units in dextrose 5 % 500 mL continuous infusion 12 Million Units (08/13/22 0116)  pencillin G potassium IV     [id number] **OR** [DISCONTINUED] acetaminophen, ALPRAZolam, bisacodyl, guaiFENesin-dextromethorphan, HYDROcodone-acetaminophen, ibuprofen, labetalol, magnesium citrate,  menthol-cetylpyridinium, naLOXone (NARCAN)  injection, ondansetron **OR** ondansetron (ZOFRAN) IV, mouth rinse, polyethylene glycol, polyvinyl alcohol, promethazine, sodium chloride flush  Antibiotics: Anti-infectives (From admission, onward)    Start     Dose/Rate Route Frequency Ordered Stop   08/13/22 1600  penicillin G potassium 4 Million Units in dextrose 5 % 250 mL IVPB        4 Million Units 250 mL/hr over 60 Minutes Intravenous Every 4 hours 08/13/22 0956     08/07/22 1800  penicillin G potassium 12 Million Units in dextrose 5 % 500 mL continuous infusion        12 Million Units 41.7 mL/hr over 12 Hours Intravenous Every 12 hours 08/07/22 1704 08/13/22 1159   08/07/22 1300  penicillin G potassium 24 Million Units in dextrose 5 % 250 mL IVPB  Status:  Discontinued        24 Million Units 250 mL/hr over 60 Minutes Intravenous Every 24 hours 08/07/22 1146 08/07/22 1704   08/02/22 2200  vancomycin (VANCOREADY) IVPB 1750 mg/350 mL  Status:  Discontinued        1,750 mg 175 mL/hr over 120 Minutes Intravenous Every 8 hours 08/02/22 1434 08/02/22 1441   08/02/22 1530  vancomycin (VANCOREADY) IVPB 1750 mg/350 mL  Status:  Discontinued        1,750 mg 175 mL/hr over 120 Minutes Intravenous Every 8 hours 08/02/22 1441 08/04/22 0823   07/31/22 1200  vancomycin (VANCOREADY) IVPB 1250 mg/250 mL  Status:  Discontinued        1,250 mg 166.7 mL/hr over 90 Minutes Intravenous Every 8 hours 07/31/22 0352 08/02/22 1434   07/31/22 1200  ceFEPIme (MAXIPIME) 2 g in sodium chloride 0.9 % 100 mL IVPB  Status:  Discontinued        2 g 200 mL/hr over 30 Minutes Intravenous Every 8 hours 07/31/22 0352 08/07/22 1146   07/31/22 0400  metroNIDAZOLE (FLAGYL) IVPB 500 mg  Status:  Discontinued        500 mg 100 mL/hr over 60 Minutes Intravenous Every 6 hours 07/31/22 0329 08/07/22 1146   07/31/22 0245  vancomycin (VANCOREADY) IVPB 2000 mg/400 mL        2,000 mg 200 mL/hr over 120 Minutes Intravenous STAT  07/31/22 0230 07/31/22 0533   07/31/22 0245  ceFEPIme (MAXIPIME) 2 g in sodium chloride 0.9 % 100 mL IVPB        2 g 200 mL/hr over 30 Minutes Intravenous STAT 07/31/22 0230 07/31/22 0333   07/31/22 0215  vancomycin (VANCOCIN) IVPB 1000 mg/200 mL premix  Status:  Discontinued        1,000 mg 200 mL/hr over 60 Minutes Intravenous  Once 07/31/22 0214 07/31/22 0229       Objective:  Vital Signs  Vitals:   08/12/22 1711 08/12/22 1941 08/12/22 2319 08/13/22 0353  BP: 126/83 130/82 122/71 96/74  Pulse: (!) 110 99 (!) 108 77  Resp: 18  16 16   Temp: 97.8 F (36.6 C) 98.5 F (36.9 C) 98.3 F (36.8 C) 97.7 F (36.5 C)  TempSrc: Oral Oral Oral Oral  SpO2: 100% 91% 98% 97%  Weight: 123.3 kg     Height:        Intake/Output Summary (Last 24 hours) at 08/13/2022 1031 Last data filed at 08/13/2022 0352 Gross per 24 hour  Intake 2162.85 ml  Output 850 ml  Net  1312.85 ml    Filed Weights   07/31/22 0300 08/08/22 0601 08/12/22 1711  Weight: 122.5 kg 120.2 kg 123.3 kg   Patient is awake alert.  In no distress. S1-S2 is normal regular. Lungs are clear to auscultation bilaterally Telemetry shows sinus rhythm with occasional sinus tachycardia.    Lab Results:  Data Reviewed: I have personally reviewed following labs and reports of the imaging studies  CBC: Recent Labs  Lab 08/07/22 0711 08/09/22 0729 08/11/22 0500  WBC 12.2* 13.1* 15.8*  HGB 14.3 13.2 13.8  HCT 43.3 42.1 42.1  MCV 82.3 83.5 82.2  PLT 599* 559* 558*     Basic Metabolic Panel: Recent Labs  Lab 08/07/22 0711 08/09/22 0729 08/11/22 0500 08/13/22 0624  NA 138 139 138 137  K 4.0 4.0 4.0 3.9  CL 106 106 105 103  CO2 25 24 24 26   GLUCOSE 99 134* 111* 97  BUN 6 5* 6 5*  CREATININE 0.57* 0.53* 0.63 0.71  CALCIUM 9.0 8.5* 8.4* 8.8*  MG  --   --  2.0 2.0     GFR: Estimated Creatinine Clearance: 177.7 mL/min (by C-G formula based on SCr of 0.71 mg/dL).    No results found for this or any  previous visit (from the past 240 hour(s)).     Radiology Studies: No results found.     LOS: 13 days   Devin Barker on [email or web address]  08/13/2022, 10:31 AM

## 2022-08-14 DIAGNOSIS — G060 Intracranial abscess and granuloma: Secondary | ICD-10-CM | POA: Diagnosis not present

## 2022-08-14 NOTE — Progress Notes (Signed)
RN called Neurosurgery group per MD Rito Ehrlich for removal of surgical staples. Operator service reached. Message sent through service

## 2022-08-14 NOTE — Progress Notes (Signed)
Per MD Franky Macho,  OK for nursing staff to remove staples from crani site

## 2022-08-14 NOTE — Progress Notes (Signed)
[facility]  Date of Admission:  07/30/2022   Total days of inpatient antibiotics 3  Principal Problem:   Intracranial abscess Active Problems:   Polysubstance abuse (HCC)   Thrombocytosis   Nausea and vomiting   Proptosis   Status post surgery   Chronic osteomyelitis of skull (HCC)          Assessment:  [name] admitted with skull mass/abscess on the right side after being struck 3 months prior by a very heavy metal rod  #Skull abscess with sign of skull OM, s/p right craniectomy with mass resection 11/13, with both sets of operative cx growing p-acnes #new onset right temporal blurry vision #hx of IVDA(last used 4 days prior to admission 07/30/22). Report the mass has been there for a couple months with HA. He noted the mass after a fall.   -MRI showed peripherally enhancing collection along right frontal lobe. Operative finding also suggestive of abscess; skull defect mentioned with pathology showing fibrosis/inflammation in the bone as well; process is extraaxial, and operative note didn't mention any hardware placed over skull defect -RPR negative. HIV negative, Urine GC negative -I am not sure what to make of the visual changes -- per nsg anatomically not able to explain with the mass   -------------- 11/27 assessment Clinically doing well Pending doxycycline susceptibility for his p-acnes Tolerating penicillin g   Would keep here on iv abx until doxy susceptibility available and can send out with it to finish 6 weeks course. There is no hardware placement per operative note     Recommendations: -f/u sent out susceptibility testing  -continue penicillin g -picc placement ordered today -id f/u in clinic will be arranged once discharge date is closer -discussed with primary team   I spent more than 35 minute reviewing data/chart, and coordinating care and >50% direct face to face time providing counseling/discussing  diagnostics/treatment plan with patient     Microbiology:   Antibiotics: Pcn g 11/20-c  Vanc+ cefepime, metro 11/12-11/20  Cultures: Blood 11/12 negative  OR 11/13 p-acnes (2 set of 2 sets)  Line: 11/23-c lue picc  SUBJECTIVE: No complaint No n/v/diarrhea No headache Staples to be removed today   Review of Systems: All other ros negative   Scheduled Meds:  carbamazepine  400 mg Oral Daily   Chlorhexidine Gluconate Cloth  6 each Topical Daily   fluvoxaMINE  50 mg Oral q AM   nicotine  7 mg Transdermal Daily   pantoprazole  40 mg Oral Daily   senna-docusate  1 tablet Oral BID   sodium chloride flush  10-40 mL Intracatheter Q12H   Continuous Infusions:  pencillin G potassium IV 4 Million Units (08/14/22 0922)   PRN Meds:.acetaminophen **OR** [DISCONTINUED] acetaminophen, ALPRAZolam, bisacodyl, guaiFENesin-dextromethorphan, HYDROcodone-acetaminophen, ibuprofen, labetalol, magnesium citrate, menthol-cetylpyridinium, naLOXone (NARCAN)  injection, ondansetron **OR** ondansetron (ZOFRAN) IV, mouth rinse, polyethylene glycol, polyvinyl alcohol, promethazine, sodium chloride flush Allergies  Allergen Reactions   Other     Pt prefers medications that do not have gelatins. No capsules, gel caps etc....due to religious reasons  Pt also had a reaction to laughing gas (liquid form)     OBJECTIVE: Vitals:   08/14/22 0032 08/14/22 0441 08/14/22 0723 08/14/22 1126  BP: 126/72 116/65 111/62 113/67  Pulse: 98 87 84 86  Resp: 17 18 18 18   Temp: (!) 97.5 F (36.4 C) 97.6 F (36.4 C) 98.1 F (36.7 C) 98 F (36.7 C)  TempSrc: Oral Oral  Oral Oral  SpO2: 100% 95% 96% 100%  Weight:      Height:       Body mass index is 42.57 kg/m.  Physical exam: General/constitutional: no distress, pleasant HEENT: Normocephalic; craniotomy site with staples intact no sign of dehiscence/tenderness/discharge Neck supple CV: rrr no mrg Lungs: clear to auscultation, normal respiratory  effort Abd: Soft, Nontender Skin no rash Ext: no edema Neuro: nonfocal   Central line presence: lue picc site no erythema/fluctuance/tenderness       Lab Results Lab Results  Component Value Date   WBC 15.8 (H) 08/11/2022   HGB 13.8 08/11/2022   HCT 42.1 08/11/2022   MCV 82.2 08/11/2022   PLT 558 (H) 08/11/2022    Lab Results  Component Value Date   CREATININE 0.71 08/13/2022   BUN 5 (L) 08/13/2022   NA 137 08/13/2022   K 3.9 08/13/2022   CL 103 08/13/2022   CO2 26 08/13/2022    Lab Results  Component Value Date   ALT 22 07/30/2022   AST 18 07/30/2022   ALKPHOS 64 07/30/2022   BILITOT 0.6 07/30/2022     Imaging: Personally reviewed and incorporated into decision making  11/13 mri brain 1. Peripherally enhancing collection along the right frontal lobe, which extends through an 18 mm calvarial defect along the right frontoparietal suture into the left temporalis muscle, concerning for an abscess. Susceptibility about the medial aspect of the collection may represent trace extra-axial hemorrhage versus calcification. 2. Dural thickening and hyperenhancement along the right frontal and temporal lobe, which may be reactive versus infectious.    Devin Band, MD [facility] 08/14/2022, 1:06 PM

## 2022-08-14 NOTE — Progress Notes (Addendum)
[facility] PROGRESS NOTE   Devin Barker [id number] DOB: 12/01/1995 DOA: 07/30/2022  PCP: Patient, No Pcp Per  Brief History/Interval Summary: 26 year old male without significant past medical history who comes into the hospital with right-sided headache and vomiting.  Imaging in the emergency room shows soft tissue mass, skull defect with some erosion and dural enhancement, concerning for abscess.  Patient's urine drug screen was positive for opioids, cocaine, amphetamines, THC.  He admits to shooting cocaine.  Patient was hospitalized for further management.  Consultants: Neurosurgery.  Infectious disease  Procedures: Right craniectomy    Subjective/Interval History: Patient noted to be less anxious and fidgety this morning.  Headache is better.  No other complaints offered.       Assessment/Plan:  Intracranial abscess/temporalis infection -Dr. Christella Noa consulted, patient was taken to the OR on 11/13 status post right craniectomy for resection.  Operative note mention that purulent material was expressed -Staples to be removed today by RN as per neurosurgery. -ID consulted.  Patient was initially on broad-spectrum antibiotics with vancomycin and cefepime.  Currently on continuous infusion of penicillin G.  -Gram stain was negative, follow cultures - preliminary surgical cultures showing 'PROPIONIBACTERIUM ACNES' - sensitivities pending AFB negative. Fungitel negative. Seems to be stable from a neurological standpoint.  ID to determine final antibiotic plan.  Waiting on susceptibility testing for doxycycline. PICC line has been placed.   Polysubstance abuse -UDS positive for opiates, cocaine, amphetamines, THC.  -Patient has been complaining of transient pain in the eye, chest, leg, and arm - these are fleeting events lasting maybe seconds to a minute. No IV narcotics to be given. Patient would like the treatment team to refrain in discussing his  polysubstance use with his family   NSVT Brief run of NSVT noted on telemetry.  Electrolytes reviewed.  Magnesium 2.0.  Potassium is 4.0.  No further episodes noted.  Telemetry was subsequently discontinued.  Nausea, vomiting Resolved   Chronic thrombocytosis Continue to follow, stable near baseline   Tobacco abuse Counseled.  History of ADHD/anxiety Patient was started back on Adderall at his request.  However upon further questioning it appears that he has not taken this medication in 3 months.  Noted to be fidgety and anxious which could be from the Adderall.  This was discontinued.   He continues to be on Xanax as needed. Noted to be on fluvoxamine as well.    Class III obesity Estimated body mass index is 42.57 kg/m as calculated from the following:   Height as of this encounter: 5\' 7"  (1.702 m).   Weight as of this encounter: 123.3 kg.   DVT Prophylaxis: SCDs Code Status: Full code Family Communication: Discussed with patient Disposition Plan: Continue to mobilize.  Status is: Inpatient Remains inpatient appropriate because: Brain abscess    Medications: Scheduled:  carbamazepine  400 mg Oral Daily   Chlorhexidine Gluconate Cloth  6 each Topical Daily   fluvoxaMINE  50 mg Oral q AM   nicotine  7 mg Transdermal Daily   pantoprazole  40 mg Oral Daily   senna-docusate  1 tablet Oral BID   sodium chloride flush  10-40 mL Intracatheter Q12H   Continuous:  pencillin G potassium IV 4 Million Units (08/14/22 [id number])   [id number] **OR** [DISCONTINUED] acetaminophen, ALPRAZolam, bisacodyl, guaiFENesin-dextromethorphan, HYDROcodone-acetaminophen, ibuprofen, labetalol, magnesium citrate, menthol-cetylpyridinium, naLOXone (NARCAN)  injection, ondansetron **OR** ondansetron (ZOFRAN) IV, mouth rinse, polyethylene glycol, polyvinyl alcohol, promethazine, sodium chloride flush  Antibiotics: Anti-infectives (From admission, onward)    Start  Dose/Rate Route Frequency  Ordered Stop   08/13/22 1600  penicillin G potassium 4 Million Units in dextrose 5 % 250 mL IVPB        4 Million Units 250 mL/hr over 60 Minutes Intravenous Every 4 hours 08/13/22 0956     08/07/22 1800  penicillin G potassium 12 Million Units in dextrose 5 % 500 mL continuous infusion        12 Million Units 41.7 mL/hr over 12 Hours Intravenous Every 12 hours 08/07/22 1704 08/13/22 1159   08/07/22 1300  penicillin G potassium 24 Million Units in dextrose 5 % 250 mL IVPB  Status:  Discontinued        24 Million Units 250 mL/hr over 60 Minutes Intravenous Every 24 hours 08/07/22 1146 08/07/22 1704   08/02/22 2200  vancomycin (VANCOREADY) IVPB 1750 mg/350 mL  Status:  Discontinued        1,750 mg 175 mL/hr over 120 Minutes Intravenous Every 8 hours 08/02/22 1434 08/02/22 1441   08/02/22 1530  vancomycin (VANCOREADY) IVPB 1750 mg/350 mL  Status:  Discontinued        1,750 mg 175 mL/hr over 120 Minutes Intravenous Every 8 hours 08/02/22 1441 08/04/22 0823   07/31/22 1200  vancomycin (VANCOREADY) IVPB 1250 mg/250 mL  Status:  Discontinued        1,250 mg 166.7 mL/hr over 90 Minutes Intravenous Every 8 hours 07/31/22 0352 08/02/22 1434   07/31/22 1200  ceFEPIme (MAXIPIME) 2 g in sodium chloride 0.9 % 100 mL IVPB  Status:  Discontinued        2 g 200 mL/hr over 30 Minutes Intravenous Every 8 hours 07/31/22 0352 08/07/22 1146   07/31/22 0400  metroNIDAZOLE (FLAGYL) IVPB 500 mg  Status:  Discontinued        500 mg 100 mL/hr over 60 Minutes Intravenous Every 6 hours 07/31/22 0329 08/07/22 1146   07/31/22 0245  vancomycin (VANCOREADY) IVPB 2000 mg/400 mL        2,000 mg 200 mL/hr over 120 Minutes Intravenous STAT 07/31/22 0230 07/31/22 0533   07/31/22 0245  ceFEPIme (MAXIPIME) 2 g in sodium chloride 0.9 % 100 mL IVPB        2 g 200 mL/hr over 30 Minutes Intravenous STAT 07/31/22 0230 07/31/22 0333   07/31/22 0215  vancomycin (VANCOCIN) IVPB 1000 mg/200 mL premix  Status:  Discontinued         1,000 mg 200 mL/hr over 60 Minutes Intravenous  Once 07/31/22 0214 07/31/22 0229       Objective:  Vital Signs  Vitals:   08/13/22 1955 08/14/22 0032 08/14/22 0441 08/14/22 0723  BP: 125/72 126/72 116/65 111/62  Pulse: 96 98 87 84  Resp: 16 17 18 18   Temp: (!) 97.3 F (36.3 C) (!) 97.5 F (36.4 C) 97.6 F (36.4 C) 98.1 F (36.7 C)  TempSrc: Oral Oral Oral Oral  SpO2: 99% 100% 95% 96%  Weight:      Height:        Intake/Output Summary (Last 24 hours) at 08/14/2022 1040 Last data filed at 08/13/2022 1640 Gross per 24 hour  Intake 786.75 ml  Output --  Net 786.75 ml    Filed Weights   07/31/22 0300 08/08/22 0601 08/12/22 1711  Weight: 122.5 kg 120.2 kg 123.3 kg   Patient is awake alert.  In no distress. S1-S2 is normal regular. Lungs are clear to auscultation bilaterally.   Lab Results:  Data Reviewed: I have personally reviewed following labs  and reports of the imaging studies  CBC: Recent Labs  Lab 08/09/22 0729 08/11/22 0500  WBC 13.1* 15.8*  HGB 13.2 13.8  HCT 42.1 42.1  MCV 83.5 82.2  PLT 559* 558*     Basic Metabolic Panel: Recent Labs  Lab 08/09/22 0729 08/11/22 0500 08/13/22 0624  NA 139 138 137  K 4.0 4.0 3.9  CL 106 105 103  CO2 24 24 26   GLUCOSE 134* 111* 97  BUN 5* 6 5*  CREATININE 0.53* 0.63 0.71  CALCIUM 8.5* 8.4* 8.8*  MG  --  2.0 2.0     GFR: Estimated Creatinine Clearance: 177.7 mL/min (by C-G formula based on SCr of 0.71 mg/dL).    No results found for this or any previous visit (from the past 240 hour(s)).     Radiology Studies: No results found.     LOS: 14 days   Gavrielle Streck [employer] on [email or web address]  08/14/2022, 10:40 AM

## 2022-08-15 NOTE — Progress Notes (Signed)
[facility] PROGRESS NOTE   Bly Bae [id number] DOB: 07-31-1996 DOA: 07/30/2022  PCP: Devin Barker, No Pcp Per  Brief History/Interval Summary: 26 year old male without significant past medical history who comes into the hospital with right-sided headache and vomiting.  Imaging in the emergency room shows soft tissue mass, skull defect with some erosion and dural enhancement, concerning for abscess.  Devin Barker's urine drug screen was positive for opioids, cocaine, amphetamines, THC.  He admits to shooting cocaine.  Devin Barker was hospitalized for further management.  Consultants: Neurosurgery.  Infectious disease  Procedures: Right craniectomy    Subjective/Interval History: Devin Barker denies any complaints this morning.  Feels better.  Denies any headaches.  Staples were taken out yesterday.    Assessment/Plan:  Intracranial abscess/temporalis infection -Dr. Christella Noa consulted, Devin Barker was taken to the OR on 11/13 status post right craniectomy for resection.  Operative note mention that purulent material was expressed.  Staples removed on 11/27. -ID consulted.  Devin Barker was initially on broad-spectrum antibiotics with vancomycin and cefepime.  Currently on continuous infusion of penicillin G.  -Gram stain was negative, follow cultures - preliminary surgical cultures showing 'PROPIONIBACTERIUM ACNES' - sensitivities pending AFB negative. Fungitel negative. Devin Barker is stable from neurological standpoint. ID to determine final antibiotic plan.  Waiting on susceptibility testing for doxycycline. PICC line has been placed.   Polysubstance abuse -UDS positive for opiates, cocaine, amphetamines, THC.  -Devin Barker has been complaining of transient pain in the eye, chest, leg, and arm - these are fleeting events lasting maybe seconds to a minute. No IV narcotics to be given. Devin Barker would like the treatment team to refrain in discussing his polysubstance use with his  family   NSVT Brief run of NSVT noted on telemetry.  Electrolytes reviewed.  Magnesium 2.0.  Potassium is 4.0.  No further episodes noted.  Telemetry was subsequently discontinued.  Nausea, vomiting Resolved   Chronic thrombocytosis Continue to follow, stable near baseline   Tobacco abuse Counseled.  History of ADHD/anxiety Devin Barker was started back on Adderall at his request.  However upon further questioning it appears that he has not taken this medication in 3 months.  Noted to be fidgety and anxious which could be from the Adderall.  This was discontinued.  Symptoms have improved. He continues to be on Xanax as needed. Noted to be on fluvoxamine as well.  Anxiety level is well-controlled.  Class III obesity Estimated body mass index is 42.57 kg/m as calculated from the following:   Height as of this encounter: 5\' 7"  (1.702 m).   Weight as of this encounter: 123.3 kg.   DVT Prophylaxis: SCDs Code Status: Full code Family Communication: Discussed with Devin Barker Disposition Plan: Continue to mobilize.  Status is: Inpatient Remains inpatient appropriate because: Brain abscess    Medications: Scheduled:  carbamazepine  400 mg Oral Daily   Chlorhexidine Gluconate Cloth  6 each Topical Daily   fluvoxaMINE  50 mg Oral q AM   nicotine  7 mg Transdermal Daily   pantoprazole  40 mg Oral Daily   senna-docusate  1 tablet Oral BID   sodium chloride flush  10-40 mL Intracatheter Q12H   Continuous:  pencillin G potassium IV 4 Million Units (08/15/22 0909)   [id number] **OR** [DISCONTINUED] acetaminophen, ALPRAZolam, bisacodyl, guaiFENesin-dextromethorphan, HYDROcodone-acetaminophen, ibuprofen, labetalol, magnesium citrate, menthol-cetylpyridinium, naLOXone (NARCAN)  injection, ondansetron **OR** ondansetron (ZOFRAN) IV, mouth rinse, polyethylene glycol, polyvinyl alcohol, promethazine, sodium chloride flush  Antibiotics: Anti-infectives (From admission, onward)    Start      Dose/Rate  Route Frequency Ordered Stop   08/13/22 1600  penicillin G potassium 4 Million Units in dextrose 5 % 250 mL IVPB        4 Million Units 250 mL/hr over 60 Minutes Intravenous Every 4 hours 08/13/22 0956     08/07/22 1800  penicillin G potassium 12 Million Units in dextrose 5 % 500 mL continuous infusion        12 Million Units 41.7 mL/hr over 12 Hours Intravenous Every 12 hours 08/07/22 1704 08/13/22 1159   08/07/22 1300  penicillin G potassium 24 Million Units in dextrose 5 % 250 mL IVPB  Status:  Discontinued        24 Million Units 250 mL/hr over 60 Minutes Intravenous Every 24 hours 08/07/22 1146 08/07/22 1704   08/02/22 2200  vancomycin (VANCOREADY) IVPB 1750 mg/350 mL  Status:  Discontinued        1,750 mg 175 mL/hr over 120 Minutes Intravenous Every 8 hours 08/02/22 1434 08/02/22 1441   08/02/22 1530  vancomycin (VANCOREADY) IVPB 1750 mg/350 mL  Status:  Discontinued        1,750 mg 175 mL/hr over 120 Minutes Intravenous Every 8 hours 08/02/22 1441 08/04/22 0823   07/31/22 1200  vancomycin (VANCOREADY) IVPB 1250 mg/250 mL  Status:  Discontinued        1,250 mg 166.7 mL/hr over 90 Minutes Intravenous Every 8 hours 07/31/22 0352 08/02/22 1434   07/31/22 1200  ceFEPIme (MAXIPIME) 2 g in sodium chloride 0.9 % 100 mL IVPB  Status:  Discontinued        2 g 200 mL/hr over 30 Minutes Intravenous Every 8 hours 07/31/22 0352 08/07/22 1146   07/31/22 0400  metroNIDAZOLE (FLAGYL) IVPB 500 mg  Status:  Discontinued        500 mg 100 mL/hr over 60 Minutes Intravenous Every 6 hours 07/31/22 0329 08/07/22 1146   07/31/22 0245  vancomycin (VANCOREADY) IVPB 2000 mg/400 mL        2,000 mg 200 mL/hr over 120 Minutes Intravenous STAT 07/31/22 0230 07/31/22 0533   07/31/22 0245  ceFEPIme (MAXIPIME) 2 g in sodium chloride 0.9 % 100 mL IVPB        2 g 200 mL/hr over 30 Minutes Intravenous STAT 07/31/22 0230 07/31/22 0333   07/31/22 0215  vancomycin (VANCOCIN) IVPB 1000 mg/200 mL premix   Status:  Discontinued        1,000 mg 200 mL/hr over 60 Minutes Intravenous  Once 07/31/22 0214 07/31/22 0229       Objective:  Vital Signs  Vitals:   08/14/22 1945 08/14/22 2341 08/15/22 0339 08/15/22 0830  BP: 138/74 (!) 112/58 114/64 126/62  Pulse: 91 76 74 66  Resp: 16 13 15 20   Temp: 97.6 F (36.4 C) 98.3 F (36.8 C) 97.8 F (36.6 C) 98.3 F (36.8 C)  TempSrc: Oral Axillary Axillary Oral  SpO2: 99% 98% 96% 92%  Weight:      Height:        Intake/Output Summary (Last 24 hours) at 08/15/2022 1037 Last data filed at 08/14/2022 1600 Gross per 24 hour  Intake 710 ml  Output --  Net 710 ml    Filed Weights   07/31/22 0300 08/08/22 0601 08/12/22 1711  Weight: 122.5 kg 120.2 kg 123.3 kg   Devin Barker is awake alert.  In no distress S1-S2 is normal regular. Lungs are clear to auscultation bilaterally Abdomen is soft.  Not distended.   Lab Results:  Data Reviewed: I have personally  reviewed following labs and reports of the imaging studies  CBC: Recent Labs  Lab 08/09/22 0729 08/11/22 0500  WBC 13.1* 15.8*  HGB 13.2 13.8  HCT 42.1 42.1  MCV 83.5 82.2  PLT 559* 558*     Basic Metabolic Panel: Recent Labs  Lab 08/09/22 0729 08/11/22 0500 08/13/22 0624  NA 139 138 137  K 4.0 4.0 3.9  CL 106 105 103  CO2 24 24 26   GLUCOSE 134* 111* 97  BUN 5* 6 5*  CREATININE 0.53* 0.63 0.71  CALCIUM 8.5* 8.4* 8.8*  MG  --  2.0 2.0     GFR: Estimated Creatinine Clearance: 177.7 mL/min (by C-G formula based on SCr of 0.71 mg/dL).    LOS: 15 days   Aloise Copus on [email or web address]  08/15/2022, 10:37 AM

## 2022-08-15 NOTE — TOC Progression Note (Signed)
Transition of Care [facility]) - Progression Note    Patient Details  Name: Devin Barker MRN: [id number] Date of Birth: [birth year]  [facility] CM/SW Contact  Kermit Balo, RN Phone Number: 08/15/2022, 2:55 PM  Clinical Narrative:    Pt continues on IV abx.  TOC following.   Expected Discharge Plan: Home/Self Care Barriers to Discharge: Continued Medical Work up  Expected Discharge Plan and Services Expected Discharge Plan: Home/Self Care   Discharge Planning Services: CM Consult   Living arrangements for the past 2 months: Single Family Home                                       Social Determinants of Health (SDOH) Interventions    Readmission Risk Interventions     No data to display

## 2022-08-15 NOTE — Plan of Care (Signed)
  Problem: Education: Goal: Knowledge of General Education information will improve Description Including pain rating scale, medication(s)/side effects and non-pharmacologic comfort measures Outcome: Progressing   Problem: Health Behavior/Discharge Planning: Goal: Ability to manage health-related needs will improve Outcome: Progressing   

## 2022-08-16 LAB — CBC
HCT: 41.1 % (ref 39.0–52.0)
Hemoglobin: 13.3 g/dL (ref 13.0–17.0)
MCH: 26.6 pg (ref 26.0–34.0)
MCHC: 32.4 g/dL (ref 30.0–36.0)
MCV: 82.2 fL (ref 80.0–100.0)
Platelets: 435 10*3/uL — ABNORMAL HIGH (ref 150–400)
RBC: 5 MIL/uL (ref 4.22–5.81)
RDW: 14.1 % (ref 11.5–15.5)
WBC: 12.8 10*3/uL — ABNORMAL HIGH (ref 4.0–10.5)
nRBC: 0 % (ref 0.0–0.2)

## 2022-08-16 LAB — BASIC METABOLIC PANEL
Anion gap: 11 (ref 5–15)
BUN: 10 mg/dL (ref 6–20)
CO2: 25 mmol/L (ref 22–32)
Calcium: 8.8 mg/dL — ABNORMAL LOW (ref 8.9–10.3)
Chloride: 102 mmol/L (ref 98–111)
Creatinine, Ser: 0.71 mg/dL (ref 0.61–1.24)
GFR, Estimated: >60 mL/min (ref >60)
Glucose, Bld: 161 mg/dL — ABNORMAL HIGH (ref 70–99)
Potassium: 4.1 mmol/L (ref 3.5–5.1)
Sodium: 138 mmol/L (ref 135–145)

## 2022-08-16 LAB — MAGNESIUM: Magnesium: 1.9 mg/dL (ref 1.7–2.4)

## 2022-08-16 NOTE — Plan of Care (Signed)
  Problem: Education: Goal: Knowledge of General Education information will improve Description Including pain rating scale, medication(s)/side effects and non-pharmacologic comfort measures Outcome: Progressing   Problem: Health Behavior/Discharge Planning: Goal: Ability to manage health-related needs will improve Outcome: Progressing   

## 2022-08-16 NOTE — TOC Progression Note (Signed)
Transition of Care [facility]) - Progression Note    Patient Details  Name: Devin Barker MRN: [id number] Date of Birth: 07/20/1996  [facility] CM/SW Contact  Kermit Balo, RN Phone Number: 08/16/2022, 2:37 PM  Clinical Narrative:    CM talked with the patient about seeing if he would qualify for [facility] for his IV abx. Pt currently is refusing.  TOC following.   Expected Discharge Plan: Home/Self Care Barriers to Discharge: Continued Medical Work up  Expected Discharge Plan and Services Expected Discharge Plan: Home/Self Care   Discharge Planning Services: CM Consult   Living arrangements for the past 2 months: Single Family Home                                       Social Determinants of Health (SDOH) Interventions    Readmission Risk Interventions     No data to display

## 2022-08-16 NOTE — Progress Notes (Signed)
[facility] PROGRESS NOTE   Devin Barker [id number] DOB: Jan 20, 1996 DOA: 07/30/2022  PCP: Patient, No Pcp Per  Brief History/Interval Summary: 26 year old male without significant past medical history who comes into the hospital with right-sided headache and vomiting.  Imaging in the emergency room shows soft tissue mass, skull defect with some erosion and dural enhancement, concerning for abscess.  Patient's urine drug screen was positive for opioids, cocaine, amphetamines, THC.  He admits to shooting cocaine.  Patient was hospitalized for further management.  Consultants: Neurosurgery.  Infectious disease  Procedures: Right craniectomy/abscess evacuation  Subjective/Interval History: No acute issues or events overnight, staples previously removed denies nausea vomiting diarrhea constipation headache fevers chills chest pain  Assessment/Plan:  Intracranial abscess/temporalis infection -Dr. Christella Noa consulted, patient was taken to the OR on 11/13 status post right craniectomy for resection.  Operative note mention that purulent material was expressed.  Staples removed on 11/27. -ID consulted.  Patient was initially on broad-spectrum antibiotics with vancomycin and cefepime.  Currently on continuous infusion of penicillin G.  -Gram stain was negative, follow cultures - preliminary surgical cultures showing 'PROPIONIBACTERIUM ACNES' - sensitivities pending AFB negative. Fungitel negative. Patient is stable from neurological standpoint. ID to determine final antibiotic plan.  Waiting on susceptibility testing for doxycycline. PICC line has been placed.   Polysubstance abuse -UDS positive for opiates, cocaine, amphetamines, THC.  -Patient has been complaining of transient pain in the eye, chest, leg, and arm - these are fleeting events lasting maybe seconds to a minute. No IV narcotics to be given. Patient would like the treatment team to refrain in  discussing his polysubstance use with his family   NSVT Brief run of NSVT noted on telemetry.  Electrolytes reviewed.  Magnesium 2.0.  Potassium is 4.0.  No further episodes noted.  Telemetry was subsequently discontinued.  Nausea, vomiting Resolved   Chronic thrombocytosis Continue to follow, stable near baseline   Tobacco abuse Counseled.  History of ADHD/anxiety Patient was started back on Adderall at his request.  However upon further questioning it appears that he has not taken this medication in 3 months.  Noted to be fidgety and anxious which could be from the Adderall.  This was discontinued.  Symptoms have improved. He continues to be on Xanax as needed. Noted to be on fluvoxamine as well.  Anxiety level is well-controlled.  Class III obesity Estimated body mass index is 42.57 kg/m as calculated from the following:   Height as of this encounter: 5\' 7"  (1.702 m).   Weight as of this encounter: 123.3 kg.   DVT Prophylaxis: SCDs Code Status: Full code Family Communication: Discussed with patient Disposition Plan: Continue to mobilize.  Unsafe discharge until completion of IV antibiotics.  Status is: Inpatient Remains inpatient appropriate because: Brain abscess -requiring IV antibiotics, unsafe to discharge with PICC line.  Medications: Scheduled:  carbamazepine  400 mg Oral Daily   Chlorhexidine Gluconate Cloth  6 each Topical Daily   fluvoxaMINE  50 mg Oral q AM   nicotine  7 mg Transdermal Daily   pantoprazole  40 mg Oral Daily   senna-docusate  1 tablet Oral BID   sodium chloride flush  10-40 mL Intracatheter Q12H   Continuous:  pencillin G potassium IV 4 Million Units (08/16/22 0509)   [id number] **OR** [DISCONTINUED] acetaminophen, ALPRAZolam, bisacodyl, guaiFENesin-dextromethorphan, HYDROcodone-acetaminophen, ibuprofen, labetalol, magnesium citrate, menthol-cetylpyridinium, naLOXone (NARCAN)  injection, ondansetron **OR** ondansetron (ZOFRAN) IV, mouth  rinse, polyethylene glycol, polyvinyl alcohol, promethazine, sodium chloride flush  Antibiotics: Anti-infectives (  From admission, onward)    Start     Dose/Rate Route Frequency Ordered Stop   08/13/22 1600  penicillin G potassium 4 Million Units in dextrose 5 % 250 mL IVPB        4 Million Units 250 mL/hr over 60 Minutes Intravenous Every 4 hours 08/13/22 0956     08/07/22 1800  penicillin G potassium 12 Million Units in dextrose 5 % 500 mL continuous infusion        12 Million Units 41.7 mL/hr over 12 Hours Intravenous Every 12 hours 08/07/22 1704 08/13/22 1159   08/07/22 1300  penicillin G potassium 24 Million Units in dextrose 5 % 250 mL IVPB  Status:  Discontinued        24 Million Units 250 mL/hr over 60 Minutes Intravenous Every 24 hours 08/07/22 1146 08/07/22 1704   08/02/22 2200  vancomycin (VANCOREADY) IVPB 1750 mg/350 mL  Status:  Discontinued        1,750 mg 175 mL/hr over 120 Minutes Intravenous Every 8 hours 08/02/22 1434 08/02/22 1441   08/02/22 1530  vancomycin (VANCOREADY) IVPB 1750 mg/350 mL  Status:  Discontinued        1,750 mg 175 mL/hr over 120 Minutes Intravenous Every 8 hours 08/02/22 1441 08/04/22 0823   07/31/22 1200  vancomycin (VANCOREADY) IVPB 1250 mg/250 mL  Status:  Discontinued        1,250 mg 166.7 mL/hr over 90 Minutes Intravenous Every 8 hours 07/31/22 0352 08/02/22 1434   07/31/22 1200  ceFEPIme (MAXIPIME) 2 g in sodium chloride 0.9 % 100 mL IVPB  Status:  Discontinued        2 g 200 mL/hr over 30 Minutes Intravenous Every 8 hours 07/31/22 0352 08/07/22 1146   07/31/22 0400  metroNIDAZOLE (FLAGYL) IVPB 500 mg  Status:  Discontinued        500 mg 100 mL/hr over 60 Minutes Intravenous Every 6 hours 07/31/22 0329 08/07/22 1146   07/31/22 0245  vancomycin (VANCOREADY) IVPB 2000 mg/400 mL        2,000 mg 200 mL/hr over 120 Minutes Intravenous STAT 07/31/22 0230 07/31/22 0533   07/31/22 0245  ceFEPIme (MAXIPIME) 2 g in sodium chloride 0.9 % 100 mL IVPB         2 g 200 mL/hr over 30 Minutes Intravenous STAT 07/31/22 0230 07/31/22 0333   07/31/22 0215  vancomycin (VANCOCIN) IVPB 1000 mg/200 mL premix  Status:  Discontinued        1,000 mg 200 mL/hr over 60 Minutes Intravenous  Once 07/31/22 0214 07/31/22 0229       Objective:  Vital Signs  Vitals:   08/15/22 1147 08/15/22 1541 08/15/22 2321 08/16/22 0311  BP: 113/64 136/80 106/61 (!) 115/39  Pulse: 67 79 73 (!) 59  Resp: 20 20 19 17   Temp: 97.7 F (36.5 C) 98.1 F (36.7 C) 97.8 F (36.6 C) (!) 97.3 F (36.3 C)  TempSrc: Oral Oral Oral Oral  SpO2: 100% 95% 96% 97%  Weight:      Height:        Intake/Output Summary (Last 24 hours) at 08/16/2022 0751 Last data filed at 08/16/2022 0509 Gross per 24 hour  Intake 1760 ml  Output 1100 ml  Net 660 ml    Filed Weights   07/31/22 0300 08/08/22 0601 08/12/22 1711  Weight: 122.5 kg 120.2 kg 123.3 kg   Patient is awake alert.  In no distress S1-S2 is normal regular. Lungs are clear to auscultation bilaterally Abdomen  is soft.  Not distended.   Lab Results:  Data Reviewed: I have personally reviewed following labs and reports of the imaging studies  CBC: Recent Labs  Lab 08/11/22 0500 08/16/22 0615  WBC 15.8* 12.8*  HGB 13.8 13.3  HCT 42.1 41.1  MCV 82.2 82.2  PLT 558* 435*     Basic Metabolic Panel: Recent Labs  Lab 08/11/22 0500 08/13/22 0624  NA 138 137  K 4.0 3.9  CL 105 103  CO2 24 26  GLUCOSE 111* 97  BUN 6 5*  CREATININE 0.63 0.71  CALCIUM 8.4* 8.8*  MG 2.0 2.0     GFR: Estimated Creatinine Clearance: 177.7 mL/min (by C-G formula based on SCr of 0.71 mg/dL).    LOS: 16 days   Azucena Fallen  [facility] Pager on [email or web address]  08/16/2022, 7:51 AM

## 2022-08-17 NOTE — Progress Notes (Signed)
[facility] PROGRESS NOTE   Devin Barker [id number] DOB: April 11, 1996 DOA: 07/30/2022  PCP: Patient, No Pcp Per  Brief History/Interval Summary: 26 year old male without significant past medical history who comes into the hospital with right-sided headache and vomiting.  Imaging in the emergency room shows soft tissue mass, skull defect with some erosion and dural enhancement, concerning for abscess.  Patient's urine drug screen was positive for opioids, cocaine, amphetamines, THC.  He admits to shooting cocaine.  Patient was hospitalized for further management.  Consultants: Neurosurgery.  Infectious disease  Procedures: Right craniectomy/abscess evacuation  Subjective/Interval History: No acute issues or events overnight, staples previously removed denies nausea vomiting diarrhea constipation headache fevers chills chest pain  Assessment/Plan:  Intracranial abscess/temporalis infection -Dr. Christella Noa consulted, patient was taken to the OR on 11/13 status post right craniectomy for resection.  Operative note mention that purulent material was expressed.  Staples removed on 11/27. -ID consulted.  Patient was initially on broad-spectrum antibiotics with vancomycin and cefepime.  Currently on continuous infusion of penicillin G.  -Gram stain was negative, follow cultures - preliminary surgical cultures showing 'PROPIONIBACTERIUM ACNES' - sensitivities pending AFB negative. Fungitel negative. Patient is stable from neurological standpoint. ID to determine final antibiotic plan.  Waiting on susceptibility testing for doxycycline. PICC line has been placed.   Polysubstance abuse -UDS positive for opiates, cocaine, amphetamines, THC.  -Patient has been complaining of transient pain in the eye, chest, leg, and arm - these are fleeting events lasting maybe seconds to a minute. No IV narcotics to be given. Patient would like the treatment team to refrain in  discussing his polysubstance use with his family   NSVT Brief run of NSVT noted on telemetry.  Electrolytes reviewed.  Magnesium 2.0.  Potassium is 4.0.  No further episodes noted.  Telemetry was subsequently discontinued.  Nausea, vomiting Resolved   Chronic thrombocytosis Continue to follow, stable near baseline   Tobacco abuse Counseled.  History of ADHD/anxiety Patient was started back on Adderall at his request.  However upon further questioning it appears that he has not taken this medication in 3 months.  Noted to be fidgety and anxious which could be from the Adderall.  This was discontinued.  Symptoms have improved. He continues to be on Xanax as needed. Noted to be on fluvoxamine as well.  Anxiety level is well-controlled.  Class III obesity Estimated body mass index is 42.57 kg/m as calculated from the following:   Height as of this encounter: 5\' 7"  (1.702 m).   Weight as of this encounter: 123.3 kg.   DVT Prophylaxis: SCDs Code Status: Full code Family Communication: Discussed with patient Disposition Plan: Continue to mobilize.  Unsafe discharge until completion of IV antibiotics.  Status is: Inpatient Remains inpatient appropriate because: Brain abscess -requiring IV antibiotics, unsafe to discharge with PICC line.  Medications: Scheduled:  carbamazepine  400 mg Oral Daily   Chlorhexidine Gluconate Cloth  6 each Topical Daily   fluvoxaMINE  50 mg Oral q AM   nicotine  7 mg Transdermal Daily   pantoprazole  40 mg Oral Daily   senna-docusate  1 tablet Oral BID   sodium chloride flush  10-40 mL Intracatheter Q12H   Continuous:  pencillin G potassium IV 4 Million Units (08/17/22 0429)   [id number] **OR** [DISCONTINUED] acetaminophen, ALPRAZolam, bisacodyl, guaiFENesin-dextromethorphan, HYDROcodone-acetaminophen, ibuprofen, labetalol, magnesium citrate, menthol-cetylpyridinium, naLOXone (NARCAN)  injection, ondansetron **OR** ondansetron (ZOFRAN) IV, mouth  rinse, polyethylene glycol, polyvinyl alcohol, promethazine, sodium chloride flush  Antibiotics: Anti-infectives (  From admission, onward)    Start     Dose/Rate Route Frequency Ordered Stop   08/13/22 1600  penicillin G potassium 4 Million Units in dextrose 5 % 250 mL IVPB        4 Million Units 250 mL/hr over 60 Minutes Intravenous Every 4 hours 08/13/22 0956     08/07/22 1800  penicillin G potassium 12 Million Units in dextrose 5 % 500 mL continuous infusion        12 Million Units 41.7 mL/hr over 12 Hours Intravenous Every 12 hours 08/07/22 1704 08/13/22 1159   08/07/22 1300  penicillin G potassium 24 Million Units in dextrose 5 % 250 mL IVPB  Status:  Discontinued        24 Million Units 250 mL/hr over 60 Minutes Intravenous Every 24 hours 08/07/22 1146 08/07/22 1704   08/02/22 2200  vancomycin (VANCOREADY) IVPB 1750 mg/350 mL  Status:  Discontinued        1,750 mg 175 mL/hr over 120 Minutes Intravenous Every 8 hours 08/02/22 1434 08/02/22 1441   08/02/22 1530  vancomycin (VANCOREADY) IVPB 1750 mg/350 mL  Status:  Discontinued        1,750 mg 175 mL/hr over 120 Minutes Intravenous Every 8 hours 08/02/22 1441 08/04/22 0823   07/31/22 1200  vancomycin (VANCOREADY) IVPB 1250 mg/250 mL  Status:  Discontinued        1,250 mg 166.7 mL/hr over 90 Minutes Intravenous Every 8 hours 07/31/22 0352 08/02/22 1434   07/31/22 1200  ceFEPIme (MAXIPIME) 2 g in sodium chloride 0.9 % 100 mL IVPB  Status:  Discontinued        2 g 200 mL/hr over 30 Minutes Intravenous Every 8 hours 07/31/22 0352 08/07/22 1146   07/31/22 0400  metroNIDAZOLE (FLAGYL) IVPB 500 mg  Status:  Discontinued        500 mg 100 mL/hr over 60 Minutes Intravenous Every 6 hours 07/31/22 0329 08/07/22 1146   07/31/22 0245  vancomycin (VANCOREADY) IVPB 2000 mg/400 mL        2,000 mg 200 mL/hr over 120 Minutes Intravenous STAT 07/31/22 0230 07/31/22 0533   07/31/22 0245  ceFEPIme (MAXIPIME) 2 g in sodium chloride 0.9 % 100 mL IVPB         2 g 200 mL/hr over 30 Minutes Intravenous STAT 07/31/22 0230 07/31/22 0333   07/31/22 0215  vancomycin (VANCOCIN) IVPB 1000 mg/200 mL premix  Status:  Discontinued        1,000 mg 200 mL/hr over 60 Minutes Intravenous  Once 07/31/22 0214 07/31/22 0229       Objective:  Vital Signs  Vitals:   08/16/22 1606 08/16/22 1936 08/16/22 2349 08/17/22 0314  BP: 128/60 128/70 (!) 120/58 113/77  Pulse: 71 83 70 64  Resp: 20 17 18 20   Temp: 98.1 F (36.7 C) 97.8 F (36.6 C) 97.6 F (36.4 C)   TempSrc: Oral Oral Oral   SpO2: 100% 92% 96% 99%  Weight:      Height:        Intake/Output Summary (Last 24 hours) at 08/17/2022 0730 Last data filed at 08/16/2022 1600 Gross per 24 hour  Intake 1380 ml  Output --  Net 1380 ml    Filed Weights   07/31/22 0300 08/08/22 0601 08/12/22 1711  Weight: 122.5 kg 120.2 kg 123.3 kg   Patient is awake alert.  In no distress S1-S2 is normal regular. Lungs are clear to auscultation bilaterally Abdomen is soft.  Not distended.  Lab Results:  Data Reviewed: I have personally reviewed following labs and reports of the imaging studies  CBC: Recent Labs  Lab 08/11/22 0500 08/16/22 0615  WBC 15.8* 12.8*  HGB 13.8 13.3  HCT 42.1 41.1  MCV 82.2 82.2  PLT 558* 435*     Basic Metabolic Panel: Recent Labs  Lab 08/11/22 0500 08/13/22 0624 08/16/22 0952  NA 138 137 138  K 4.0 3.9 4.1  CL 105 103 102  CO2 24 26 25   GLUCOSE 111* 97 161*  BUN 6 5* 10  CREATININE 0.63 0.71 0.71  CALCIUM 8.4* 8.8* 8.8*  MG 2.0 2.0 1.9     GFR: Estimated Creatinine Clearance: 177.7 mL/min (by C-G formula based on SCr of 0.71 mg/dL).    LOS: 17 days    [facility] Pager on [email or web address]  08/17/2022, 7:30 AM

## 2022-08-17 NOTE — Plan of Care (Signed)
  Problem: Education: Goal: Knowledge of General Education information will improve Description Including pain rating scale, medication(s)/side effects and non-pharmacologic comfort measures Outcome: Progressing   Problem: Health Behavior/Discharge Planning: Goal: Ability to manage health-related needs will improve Outcome: Progressing   

## 2022-08-18 NOTE — Progress Notes (Signed)
[facility] PROGRESS NOTE   Devin Barker [id number] DOB: 1995/12/18 DOA: 07/30/2022  PCP: Patient, No Pcp Per  Brief History/Interval Summary: 26 year old male without significant past medical history who comes into the hospital with right-sided headache and vomiting.  Imaging in the emergency room shows soft tissue mass, skull defect with some erosion and dural enhancement, concerning for abscess.  Patient's urine drug screen was positive for opioids, cocaine, amphetamines, THC.  He admits to shooting cocaine.  Patient was hospitalized for further management.  Consultants: Neurosurgery.  Infectious disease  Procedures: Right craniectomy/abscess evacuation  Subjective/Interval History: No acute issues or events overnight, staples previously removed denies nausea vomiting diarrhea constipation headache fevers chills chest pain  Assessment/Plan:  Intracranial abscess/temporalis infection -Dr. Christella Noa consulted, patient was taken to the OR on 11/13 status post right craniectomy for resection.  Operative note mention that purulent material was expressed.  Staples removed on 11/27. -ID consulted.  Patient was initially on broad-spectrum antibiotics with vancomycin and cefepime.  Currently on continuous infusion of penicillin G.  -Gram stain was negative, follow cultures - preliminary surgical cultures showing 'PROPIONIBACTERIUM ACNES' - sensitivities pending AFB negative. Fungitel negative. Patient is stable from neurological standpoint. ID to determine final antibiotic plan.  Waiting on susceptibility testing for doxycycline. PICC line has been placed.   Polysubstance abuse -UDS positive for opiates, cocaine, amphetamines, THC.  -Patient has been complaining of transient pain in the eye, chest, leg, and arm - these are fleeting events lasting maybe seconds to a minute. No IV narcotics to be given. Patient would like the treatment team to refrain in  discussing his polysubstance use with his family   NSVT Brief run of NSVT noted on telemetry.  Electrolytes reviewed.  Magnesium 2.0.  Potassium is 4.0.  No further episodes noted.  Telemetry was subsequently discontinued.  Nausea, vomiting Resolved   Chronic thrombocytosis Continue to follow, stable near baseline   Tobacco abuse Counseled.  History of ADHD/anxiety Patient was started back on Adderall at his request.  However upon further questioning it appears that he has not taken this medication in 3 months.  Noted to be fidgety and anxious which could be from the Adderall.  This was discontinued.  Symptoms have improved. He continues to be on Xanax as needed. Noted to be on fluvoxamine as well.  Anxiety level is well-controlled.  Class III obesity Estimated body mass index is 42.57 kg/m as calculated from the following:   Height as of this encounter: 5\' 7"  (1.702 m).   Weight as of this encounter: 123.3 kg.   DVT Prophylaxis: SCDs Code Status: Full code Family Communication: Discussed with patient Disposition Plan: Continue to mobilize.  Unsafe discharge until completion of IV antibiotics.  Status is: Inpatient Remains inpatient appropriate because: Brain abscess -requiring IV antibiotics, unsafe to discharge with PICC line.  Medications: Scheduled:  carbamazepine  400 mg Oral Daily   Chlorhexidine Gluconate Cloth  6 each Topical Daily   fluvoxaMINE  50 mg Oral q AM   nicotine  7 mg Transdermal Daily   pantoprazole  40 mg Oral Daily   senna-docusate  1 tablet Oral BID   sodium chloride flush  10-40 mL Intracatheter Q12H   Continuous:  pencillin G potassium IV 4 Million Units (08/18/22 0451)   [id number] **OR** [DISCONTINUED] acetaminophen, ALPRAZolam, bisacodyl, guaiFENesin-dextromethorphan, HYDROcodone-acetaminophen, ibuprofen, labetalol, magnesium citrate, menthol-cetylpyridinium, naLOXone (NARCAN)  injection, ondansetron **OR** ondansetron (ZOFRAN) IV, mouth  rinse, polyethylene glycol, polyvinyl alcohol, promethazine, sodium chloride flush  Antibiotics: Anti-infectives (  From admission, onward)    Start     Dose/Rate Route Frequency Ordered Stop   08/13/22 1600  penicillin G potassium 4 Million Units in dextrose 5 % 250 mL IVPB        4 Million Units 250 mL/hr over 60 Minutes Intravenous Every 4 hours 08/13/22 0956     08/07/22 1800  penicillin G potassium 12 Million Units in dextrose 5 % 500 mL continuous infusion        12 Million Units 41.7 mL/hr over 12 Hours Intravenous Every 12 hours 08/07/22 1704 08/13/22 1159   08/07/22 1300  penicillin G potassium 24 Million Units in dextrose 5 % 250 mL IVPB  Status:  Discontinued        24 Million Units 250 mL/hr over 60 Minutes Intravenous Every 24 hours 08/07/22 1146 08/07/22 1704   08/02/22 2200  vancomycin (VANCOREADY) IVPB 1750 mg/350 mL  Status:  Discontinued        1,750 mg 175 mL/hr over 120 Minutes Intravenous Every 8 hours 08/02/22 1434 08/02/22 1441   08/02/22 1530  vancomycin (VANCOREADY) IVPB 1750 mg/350 mL  Status:  Discontinued        1,750 mg 175 mL/hr over 120 Minutes Intravenous Every 8 hours 08/02/22 1441 08/04/22 0823   07/31/22 1200  vancomycin (VANCOREADY) IVPB 1250 mg/250 mL  Status:  Discontinued        1,250 mg 166.7 mL/hr over 90 Minutes Intravenous Every 8 hours 07/31/22 0352 08/02/22 1434   07/31/22 1200  ceFEPIme (MAXIPIME) 2 g in sodium chloride 0.9 % 100 mL IVPB  Status:  Discontinued        2 g 200 mL/hr over 30 Minutes Intravenous Every 8 hours 07/31/22 0352 08/07/22 1146   07/31/22 0400  metroNIDAZOLE (FLAGYL) IVPB 500 mg  Status:  Discontinued        500 mg 100 mL/hr over 60 Minutes Intravenous Every 6 hours 07/31/22 0329 08/07/22 1146   07/31/22 0245  vancomycin (VANCOREADY) IVPB 2000 mg/400 mL        2,000 mg 200 mL/hr over 120 Minutes Intravenous STAT 07/31/22 0230 07/31/22 0533   07/31/22 0245  ceFEPIme (MAXIPIME) 2 g in sodium chloride 0.9 % 100 mL IVPB         2 g 200 mL/hr over 30 Minutes Intravenous STAT 07/31/22 0230 07/31/22 0333   07/31/22 0215  vancomycin (VANCOCIN) IVPB 1000 mg/200 mL premix  Status:  Discontinued        1,000 mg 200 mL/hr over 60 Minutes Intravenous  Once 07/31/22 0214 07/31/22 0229       Objective:  Vital Signs  Vitals:   08/17/22 1239 08/17/22 2200 08/18/22 0355 08/18/22 0745  BP: 116/71 121/78 (!) 108/45 (!) 101/46  Pulse: 80 79 68 75  Resp: 20 18 16 16   Temp: 97.7 F (36.5 C) 98 F (36.7 C) 98.6 F (37 C) 98.2 F (36.8 C)  TempSrc: Oral Oral  Oral  SpO2: 100% 99% 95% 97%  Weight:      Height:       No intake or output data in the 24 hours ending 08/18/22 0757  Filed Weights   07/31/22 0300 08/08/22 0601 08/12/22 1711  Weight: 122.5 kg 120.2 kg 123.3 kg   Patient is awake alert.  In no distress S1-S2 is normal regular. Lungs are clear to auscultation bilaterally Abdomen is soft.  Not distended.   Lab Results:  Data Reviewed: I have personally reviewed following labs and reports of the  imaging studies  CBC: Recent Labs  Lab 08/16/22 0615  WBC 12.8*  HGB 13.3  HCT 41.1  MCV 82.2  PLT 435*     Basic Metabolic Panel: Recent Labs  Lab 08/13/22 0624 08/16/22 0952  NA 137 138  K 3.9 4.1  CL 103 102  CO2 26 25  GLUCOSE 97 161*  BUN 5* 10  CREATININE 0.71 0.71  CALCIUM 8.8* 8.8*  MG 2.0 1.9     GFR: Estimated Creatinine Clearance: 177.7 mL/min (by C-G formula based on SCr of 0.71 mg/dL).    LOS: 18 days   Azucena Fallen  [facility] Pager on [email or web address]  08/18/2022, 7:57 AM

## 2022-08-19 ENCOUNTER — Inpatient Hospital Stay (HOSPITAL_COMMUNITY): Payer: BLUE CROSS/BLUE SHIELD

## 2022-08-19 LAB — CBC WITH DIFFERENTIAL/PLATELET
Abs Immature Granulocytes: 0 10*3/uL (ref 0.00–0.07)
Basophils Absolute: 0.1 10*3/uL (ref 0.0–0.1)
Basophils Relative: 1 %
Eosinophils Absolute: 2.5 10*3/uL — ABNORMAL HIGH (ref 0.0–0.5)
Eosinophils Relative: 21 %
HCT: 43.1 % (ref 39.0–52.0)
Hemoglobin: 14.3 g/dL (ref 13.0–17.0)
Lymphocytes Relative: 38 %
Lymphs Abs: 4.6 10*3/uL — ABNORMAL HIGH (ref 0.7–4.0)
MCH: 26.7 pg (ref 26.0–34.0)
MCHC: 33.2 g/dL (ref 30.0–36.0)
MCV: 80.6 fL (ref 80.0–100.0)
Monocytes Absolute: 0.8 10*3/uL (ref 0.1–1.0)
Monocytes Relative: 7 %
Neutro Abs: 4 10*3/uL (ref 1.7–7.7)
Neutrophils Relative %: 33 %
Platelets: 419 10*3/uL — ABNORMAL HIGH (ref 150–400)
RBC: 5.35 MIL/uL (ref 4.22–5.81)
RDW: 14.3 % (ref 11.5–15.5)
WBC: 12 10*3/uL — ABNORMAL HIGH (ref 4.0–10.5)
nRBC: 0 % (ref 0.0–0.2)
nRBC: 0 /100 WBC

## 2022-08-19 LAB — COMPREHENSIVE METABOLIC PANEL
ALT: 28 U/L (ref 0–44)
AST: 24 U/L (ref 15–41)
Albumin: 3.1 g/dL — ABNORMAL LOW (ref 3.5–5.0)
Alkaline Phosphatase: 51 U/L (ref 38–126)
Anion gap: 8 (ref 5–15)
BUN: 6 mg/dL (ref 6–20)
CO2: 26 mmol/L (ref 22–32)
Calcium: 8.7 mg/dL — ABNORMAL LOW (ref 8.9–10.3)
Chloride: 104 mmol/L (ref 98–111)
Creatinine, Ser: 0.6 mg/dL — ABNORMAL LOW (ref 0.61–1.24)
GFR, Estimated: >60 mL/min (ref >60)
Glucose, Bld: 91 mg/dL (ref 70–99)
Potassium: 4 mmol/L (ref 3.5–5.1)
Sodium: 138 mmol/L (ref 135–145)
Total Bilirubin: 0.4 mg/dL (ref 0.3–1.2)
Total Protein: 6.9 g/dL (ref 6.5–8.1)

## 2022-08-19 LAB — C-REACTIVE PROTEIN: CRP: 2.2 mg/dL — ABNORMAL HIGH (ref <1.0)

## 2022-08-19 MED ORDER — ALTEPLASE 2 MG IJ SOLR
2.0000 mg | Freq: Once | INTRAMUSCULAR | Status: AC
  Administered 2022-08-19: 2 mg
  Filled 2022-08-19: qty 2

## 2022-08-19 NOTE — Progress Notes (Signed)
Order wrote for IV Team to come and assess PICC. Unable to draw back bld and very difficult to flush both ports. Pt needs AM labs done also.

## 2022-08-19 NOTE — Progress Notes (Signed)
[facility] PROGRESS NOTE   Bruin Bolger UJW:[id number] DOB: 1996/05/30 DOA: 07/30/2022  PCP: Patient, No Pcp Per  Brief History/Interval Summary: 26 year old male without significant past medical history who comes into the hospital with right-sided headache and vomiting.  Imaging in the emergency room shows soft tissue mass, skull defect with some erosion and dural enhancement, concerning for abscess.  Patient's urine drug screen was positive for opioids, cocaine, amphetamines, THC.  He admits to shooting cocaine.  Patient was hospitalized for further management.  Consultants: Neurosurgery.  Infectious disease  Procedures: Right craniectomy/abscess evacuation  Subjective/Interval History: No acute issues or events overnight, staples previously removed denies nausea vomiting diarrhea constipation headache fevers chills chest pain  Assessment/Plan:  Intracranial abscess/temporalis infection -Dr. Franky Macho consulted, patient was taken to the OR on 11/13 status post right craniectomy for resection.  Operative note mention that purulent material was expressed.  Staples removed on 11/27. -ID consulted.  Patient was initially on broad-spectrum antibiotics with vancomycin and cefepime.  Currently on continuous infusion of penicillin G.  -Gram stain was negative, follow cultures - preliminary surgical cultures showing 'PROPIONIBACTERIUM ACNES' - sensitivities pending AFB negative. Fungitel negative. Patient is stable from neurological standpoint. ID to determine final antibiotic plan.  Waiting on susceptibility testing for doxycycline. PICC line has been placed.   Polysubstance abuse -UDS positive for opiates, cocaine, amphetamines, THC.  -Patient has been complaining of transient pain in the eye, chest, leg, and arm - these are fleeting events lasting maybe seconds to a minute. No IV narcotics to be given. Patient would like the treatment team to refrain in  discussing his polysubstance use with his family   NSVT Brief run of NSVT noted on telemetry.  Electrolytes reviewed.  Magnesium 2.0.  Potassium is 4.0.  No further episodes noted.  Telemetry was subsequently discontinued.  Nausea, vomiting Resolved   Chronic thrombocytosis Continue to follow, stable near baseline   Tobacco abuse Counseled.  History of ADHD/anxiety Patient was started back on Adderall at his request.  However upon further questioning it appears that he has not taken this medication in 3 months.  Noted to be fidgety and anxious which could be from the Adderall.  This was discontinued.  Symptoms have improved. He continues to be on Xanax as needed. Noted to be on fluvoxamine as well.  Anxiety level is well-controlled.  Class III obesity Estimated body mass index is 42.57 kg/m as calculated from the following:   Height as of this encounter: 5\' 7"  (1.702 m).   Weight as of this encounter: 123.3 kg.   DVT Prophylaxis: SCDs Code Status: Full code Family Communication: Discussed with patient Disposition Plan: Continue to mobilize.  Unsafe discharge until completion of IV antibiotics.  Status is: Inpatient Remains inpatient appropriate because: Brain abscess -requiring IV antibiotics, unsafe to discharge with PICC line.  Medications: Scheduled:  carbamazepine  400 mg Oral Daily   Chlorhexidine Gluconate Cloth  6 each Topical Daily   fluvoxaMINE  50 mg Oral q AM   nicotine  7 mg Transdermal Daily   pantoprazole  40 mg Oral Daily   senna-docusate  1 tablet Oral BID   sodium chloride flush  10-40 mL Intracatheter Q12H   Continuous:  pencillin G potassium IV 4 Million Units (08/19/22 0425)   14/02/23 **OR** [DISCONTINUED] acetaminophen, ALPRAZolam, bisacodyl, guaiFENesin-dextromethorphan, HYDROcodone-acetaminophen, ibuprofen, labetalol, magnesium citrate, menthol-cetylpyridinium, naLOXone (NARCAN)  injection, ondansetron **OR** ondansetron (ZOFRAN) IV, mouth  rinse, polyethylene glycol, polyvinyl alcohol, promethazine, sodium chloride flush  Antibiotics: Anti-infectives (  From admission, onward)    Start     Dose/Rate Route Frequency Ordered Stop   08/13/22 1600  penicillin G potassium 4 Million Units in dextrose 5 % 250 mL IVPB        4 Million Units 250 mL/hr over 60 Minutes Intravenous Every 4 hours 08/13/22 0956     08/07/22 1800  penicillin G potassium 12 Million Units in dextrose 5 % 500 mL continuous infusion        12 Million Units 41.7 mL/hr over 12 Hours Intravenous Every 12 hours 08/07/22 1704 08/13/22 1159   08/07/22 1300  penicillin G potassium 24 Million Units in dextrose 5 % 250 mL IVPB  Status:  Discontinued        24 Million Units 250 mL/hr over 60 Minutes Intravenous Every 24 hours 08/07/22 1146 08/07/22 1704   08/02/22 2200  vancomycin (VANCOREADY) IVPB 1750 mg/350 mL  Status:  Discontinued        1,750 mg 175 mL/hr over 120 Minutes Intravenous Every 8 hours 08/02/22 1434 08/02/22 1441   08/02/22 1530  vancomycin (VANCOREADY) IVPB 1750 mg/350 mL  Status:  Discontinued        1,750 mg 175 mL/hr over 120 Minutes Intravenous Every 8 hours 08/02/22 1441 08/04/22 0823   07/31/22 1200  vancomycin (VANCOREADY) IVPB 1250 mg/250 mL  Status:  Discontinued        1,250 mg 166.7 mL/hr over 90 Minutes Intravenous Every 8 hours 07/31/22 0352 08/02/22 1434   07/31/22 1200  ceFEPIme (MAXIPIME) 2 g in sodium chloride 0.9 % 100 mL IVPB  Status:  Discontinued        2 g 200 mL/hr over 30 Minutes Intravenous Every 8 hours 07/31/22 0352 08/07/22 1146   07/31/22 0400  metroNIDAZOLE (FLAGYL) IVPB 500 mg  Status:  Discontinued        500 mg 100 mL/hr over 60 Minutes Intravenous Every 6 hours 07/31/22 0329 08/07/22 1146   07/31/22 0245  vancomycin (VANCOREADY) IVPB 2000 mg/400 mL        2,000 mg 200 mL/hr over 120 Minutes Intravenous STAT 07/31/22 0230 07/31/22 0533   07/31/22 0245  ceFEPIme (MAXIPIME) 2 g in sodium chloride 0.9 % 100 mL IVPB         2 g 200 mL/hr over 30 Minutes Intravenous STAT 07/31/22 0230 07/31/22 0333   07/31/22 0215  vancomycin (VANCOCIN) IVPB 1000 mg/200 mL premix  Status:  Discontinued        1,000 mg 200 mL/hr over 60 Minutes Intravenous  Once 07/31/22 0214 07/31/22 0229       Objective:  Vital Signs  Vitals:   08/18/22 1528 08/18/22 2025 08/18/22 2346 08/19/22 0419  BP: 119/65 119/75 105/62 (!) 101/51  Pulse: 61 75 68 64  Resp: 16 18 20 18   Temp: 98.3 F (36.8 C) 98.1 F (36.7 C) (!) 97.4 F (36.3 C) 97.7 F (36.5 C)  TempSrc: Oral Oral  Oral  SpO2: 96% 97% 96% 100%  Weight:      Height:        Intake/Output Summary (Last 24 hours) at 08/19/2022 0732 Last data filed at 08/18/2022 2300 Gross per 24 hour  Intake 240 ml  Output --  Net 240 ml    Filed Weights   07/31/22 0300 08/08/22 0601 08/12/22 1711  Weight: 122.5 kg 120.2 kg 123.3 kg   Patient is awake alert.  In no distress S1-S2 is normal regular. Lungs are clear to auscultation bilaterally Abdomen is soft.  Not distended.   Lab Results:  Data Reviewed: I have personally reviewed following labs and reports of the imaging studies  CBC: Recent Labs  Lab 08/16/22 0615  WBC 12.8*  HGB 13.3  HCT 41.1  MCV 82.2  PLT 435*     Basic Metabolic Panel: Recent Labs  Lab 08/13/22 0624 08/16/22 0952  NA 137 138  K 3.9 4.1  CL 103 102  CO2 26 25  GLUCOSE 97 161*  BUN 5* 10  CREATININE 0.71 0.71  CALCIUM 8.8* 8.8*  MG 2.0 1.9     GFR: Estimated Creatinine Clearance: 177.7 mL/min (by C-G formula based on SCr of 0.71 mg/dL).    LOS: 19 days   [facility] Pager on [email or web address]  08/19/2022, 7:32 AM

## 2022-08-20 MED ORDER — FLUVOXAMINE MALEATE 50 MG PO TABS
50.0000 mg | ORAL_TABLET | ORAL | Status: AC
  Administered 2022-08-20: 50 mg via ORAL
  Filled 2022-08-20: qty 1

## 2022-08-20 MED ORDER — FLUVOXAMINE MALEATE 100 MG PO TABS
100.0000 mg | ORAL_TABLET | Freq: Every morning | ORAL | Status: DC
  Administered 2022-08-21 – 2022-08-22 (×2): 100 mg via ORAL
  Filled 2022-08-20 (×4): qty 1

## 2022-08-20 MED ORDER — HYDROCODONE-ACETAMINOPHEN 5-325 MG PO TABS: 1.0000 | ORAL_TABLET | Freq: Four times a day (QID) | ORAL | Status: DC | PRN

## 2022-08-20 MED ORDER — HYDROCODONE-ACETAMINOPHEN 5-325 MG PO TABS
1.0000 | ORAL_TABLET | Freq: Four times a day (QID) | ORAL | Status: DC | PRN
  Administered 2022-08-20: 1 via ORAL
  Filled 2022-08-20: qty 1

## 2022-08-20 NOTE — Progress Notes (Signed)
[facility] PROGRESS NOTE   Devin Barker [id number] DOB: May 24, 1996 DOA: 07/30/2022  PCP: Patient, No Pcp Per  Brief History/Interval Summary: 26 year old male without significant past medical history who comes into the hospital with right-sided headache and vomiting.  Imaging in the emergency room shows soft tissue mass, skull defect with some erosion and dural enhancement, concerning for abscess.  Patient's urine drug screen was positive for opioids, cocaine, amphetamines, THC.  He admits to shooting cocaine.  Patient was hospitalized for further management.  Consultants: Neurosurgery.  Infectious disease  Procedures: Right craniectomy/abscess evacuation  Subjective/Interval History: No acute issues or events overnight, staples previously removed denies nausea vomiting diarrhea constipation headache fevers chills chest pain  Assessment/Plan:  Intracranial abscess/temporalis infection -Dr. Christella Noa consulted, patient was taken to the OR on 11/13 status post right craniectomy for resection.  Operative note mention that purulent material was expressed.  Staples removed on 11/27. -ID consulted.  Patient was initially on broad-spectrum antibiotics with vancomycin and cefepime.  Currently on continuous infusion of penicillin G.  -Gram stain was negative, follow cultures - preliminary surgical cultures showing 'PROPIONIBACTERIUM ACNES' - sensitivities pending AFB negative. Fungitel negative. Patient is stable from neurological standpoint. ID to determine final antibiotic plan.  Waiting on susceptibility testing for doxycycline. PICC line has been placed.   Polysubstance abuse -UDS positive for opiates, cocaine, amphetamines, THC.  -Patient has been complaining of transient pain in the eye, chest, leg, and arm - these are fleeting events lasting maybe seconds to a minute. No IV narcotics to be given. - Weaned off hydrocodone 08/20/22 - Continue OTC analgesics as  indicated Patient would like the treatment team to refrain in discussing his polysubstance use with his family   NSVT Brief run of NSVT noted on telemetry.  Electrolytes reviewed.  Magnesium 2.0.  Potassium is 4.0.  No further episodes noted.  Telemetry was subsequently discontinued.  Nausea, vomiting Resolved   Chronic thrombocytosis Continue to follow, stable near baseline   Tobacco abuse Counseled.  History of ADHD/anxiety Patient was started back on Adderall at his request.  However upon further questioning it appears that he has not taken this medication in 3 months.  Noted to be fidgety and anxious which could be from the Adderall.  This was discontinued.  Symptoms have improved. He continues to be on Xanax as needed and we discussed this is not an ideal maintenance med for him Increase Fluvoxamine 100mg  - wean down xanax over the next week as able  Class III obesity Estimated body mass index is 42.57 kg/m as calculated from the following:   Height as of this encounter: 5\' 7"  (1.702 m).   Weight as of this encounter: 123.3 kg.   DVT Prophylaxis: SCDs Code Status: Full code Family Communication: Discussed with patient Disposition Plan: Continue to mobilize.  Unsafe discharge until completion of IV antibiotics.  Status is: Inpatient Remains inpatient appropriate because: Brain abscess -requiring IV antibiotics, unsafe to discharge with PICC line due to history of drug abuse.  Medications: Scheduled:  carbamazepine  400 mg Oral Daily   Chlorhexidine Gluconate Cloth  6 each Topical Daily   fluvoxaMINE  50 mg Oral q AM   nicotine  7 mg Transdermal Daily   pantoprazole  40 mg Oral Daily   senna-docusate  1 tablet Oral BID   sodium chloride flush  10-40 mL Intracatheter Q12H   Continuous:  pencillin G potassium IV 4 Million Units (08/20/22 0419)   [id number] **OR** [DISCONTINUED] acetaminophen, ALPRAZolam, bisacodyl,  guaiFENesin-dextromethorphan,  HYDROcodone-acetaminophen, ibuprofen, labetalol, magnesium citrate, menthol-cetylpyridinium, naLOXone (NARCAN)  injection, ondansetron **OR** ondansetron (ZOFRAN) IV, mouth rinse, polyethylene glycol, polyvinyl alcohol, promethazine, sodium chloride flush  Antibiotics: Anti-infectives (From admission, onward)    Start     Dose/Rate Route Frequency Ordered Stop   08/13/22 1600  penicillin G potassium 4 Million Units in dextrose 5 % 250 mL IVPB        4 Million Units 250 mL/hr over 60 Minutes Intravenous Every 4 hours 08/13/22 0956     08/07/22 1800  penicillin G potassium 12 Million Units in dextrose 5 % 500 mL continuous infusion        12 Million Units 41.7 mL/hr over 12 Hours Intravenous Every 12 hours 08/07/22 1704 08/13/22 1159   08/07/22 1300  penicillin G potassium 24 Million Units in dextrose 5 % 250 mL IVPB  Status:  Discontinued        24 Million Units 250 mL/hr over 60 Minutes Intravenous Every 24 hours 08/07/22 1146 08/07/22 1704   08/02/22 2200  vancomycin (VANCOREADY) IVPB 1750 mg/350 mL  Status:  Discontinued        1,750 mg 175 mL/hr over 120 Minutes Intravenous Every 8 hours 08/02/22 1434 08/02/22 1441   08/02/22 1530  vancomycin (VANCOREADY) IVPB 1750 mg/350 mL  Status:  Discontinued        1,750 mg 175 mL/hr over 120 Minutes Intravenous Every 8 hours 08/02/22 1441 08/04/22 0823   07/31/22 1200  vancomycin (VANCOREADY) IVPB 1250 mg/250 mL  Status:  Discontinued        1,250 mg 166.7 mL/hr over 90 Minutes Intravenous Every 8 hours 07/31/22 0352 08/02/22 1434   07/31/22 1200  ceFEPIme (MAXIPIME) 2 g in sodium chloride 0.9 % 100 mL IVPB  Status:  Discontinued        2 g 200 mL/hr over 30 Minutes Intravenous Every 8 hours 07/31/22 0352 08/07/22 1146   07/31/22 0400  metroNIDAZOLE (FLAGYL) IVPB 500 mg  Status:  Discontinued        500 mg 100 mL/hr over 60 Minutes Intravenous Every 6 hours 07/31/22 0329 08/07/22 1146   07/31/22 0245  vancomycin (VANCOREADY) IVPB 2000 mg/400  mL        2,000 mg 200 mL/hr over 120 Minutes Intravenous STAT 07/31/22 0230 07/31/22 0533   07/31/22 0245  ceFEPIme (MAXIPIME) 2 g in sodium chloride 0.9 % 100 mL IVPB        2 g 200 mL/hr over 30 Minutes Intravenous STAT 07/31/22 0230 07/31/22 0333   07/31/22 0215  vancomycin (VANCOCIN) IVPB 1000 mg/200 mL premix  Status:  Discontinued        1,000 mg 200 mL/hr over 60 Minutes Intravenous  Once 07/31/22 0214 07/31/22 0229       Objective:  Vital Signs  Vitals:   08/19/22 1619 08/19/22 1934 08/19/22 2340 08/20/22 0421  BP: (!) 132/57 128/64 (!) 146/75 115/71  Pulse: 74 72 92 87  Resp: 16 18 18 16   Temp: 98.1 F (36.7 C) 97.7 F (36.5 C) 98 F (36.7 C) 97.7 F (36.5 C)  TempSrc: Oral Oral Oral Oral  SpO2: 98% 98% 99% 98%  Weight:      Height:        Intake/Output Summary (Last 24 hours) at 08/20/2022 0734 Last data filed at 08/19/2022 2339 Gross per 24 hour  Intake 480 ml  Output --  Net 480 ml    Filed Weights   07/31/22 0300 08/08/22 0601 08/12/22 1711  Weight: 122.5 kg 120.2 kg 123.3 kg   Patient is awake alert.  In no distress S1-S2 is normal regular. Lungs are clear to auscultation bilaterally Abdomen is soft.  Not distended.   Lab Results:  Data Reviewed: I have personally reviewed following labs and reports of the imaging studies  CBC: Recent Labs  Lab 08/16/22 0615 08/19/22 0740  WBC 12.8* 12.0*  NEUTROABS  --  4.0  HGB 13.3 14.3  HCT 41.1 43.1  MCV 82.2 80.6  PLT 435* 419*     Basic Metabolic Panel: Recent Labs  Lab 08/16/22 0952 08/19/22 0740  NA 138 138  K 4.1 4.0  CL 102 104  CO2 25 26  GLUCOSE 161* 91  BUN 10 6  CREATININE 0.71 0.60*  CALCIUM 8.8* 8.7*  MG 1.9  --      GFR: Estimated Creatinine Clearance: 177.7 mL/min (A) (by C-G formula based on SCr of 0.6 mg/dL (L)).    LOS: 20 days   Little Ishikawa  [facility] Pager on [email or web address]  08/20/2022, 7:34 AM

## 2022-08-21 DIAGNOSIS — M869 Osteomyelitis, unspecified: Secondary | ICD-10-CM

## 2022-08-21 DIAGNOSIS — Z1159 Encounter for screening for other viral diseases: Secondary | ICD-10-CM

## 2022-08-21 DIAGNOSIS — M8618 Other acute osteomyelitis, other site: Secondary | ICD-10-CM | POA: Diagnosis not present

## 2022-08-21 DIAGNOSIS — Z9189 Other specified personal risk factors, not elsewhere classified: Secondary | ICD-10-CM

## 2022-08-21 DIAGNOSIS — F1910 Other psychoactive substance abuse, uncomplicated: Secondary | ICD-10-CM | POA: Diagnosis not present

## 2022-08-21 DIAGNOSIS — G060 Intracranial abscess and granuloma: Secondary | ICD-10-CM | POA: Diagnosis not present

## 2022-08-21 HISTORY — DX: Other specified personal risk factors, not elsewhere classified: Z11.59

## 2022-08-21 HISTORY — DX: Other specified personal risk factors, not elsewhere classified: Z91.89

## 2022-08-21 LAB — HEPATITIS A ANTIBODY, TOTAL: hep A Total Ab: REACTIVE — AB

## 2022-08-21 LAB — HEPATITIS B SURFACE ANTIGEN: Hepatitis B Surface Ag: NONREACTIVE

## 2022-08-21 LAB — SEDIMENTATION RATE: Sed Rate: 31 mm/hr — ABNORMAL HIGH (ref 0–16)

## 2022-08-21 LAB — C-REACTIVE PROTEIN: CRP: 3 mg/dL — ABNORMAL HIGH (ref <1.0)

## 2022-08-21 NOTE — Progress Notes (Signed)
[facility] PROGRESS NOTE   [name] YQM:[id number] DOB: [birth year] DOA: 07/30/2022  PCP: Devin Barker, No Pcp Per  Brief History/Interval Summary: 26 year old male without significant past medical history who comes into the hospital with right-sided headache and vomiting.  Imaging in the emergency room shows soft tissue mass, skull defect with some erosion and dural enhancement, concerning for abscess.  Devin Barker's urine drug screen was positive for opioids, cocaine, amphetamines, THC.  He admits to shooting cocaine.  Devin Barker was hospitalized for further management.  Consultants: Neurosurgery.  Infectious disease  Procedures: Right craniectomy/abscess evacuation  Subjective/Interval History: No acute issues or events overnight, staples previously removed denies nausea vomiting diarrhea constipation headache fevers chills chest pain  Assessment/Plan:  Intracranial abscess/temporalis infection -Dr. Franky Macho consulted, Devin Barker was taken to the OR on 11/13 status post right craniectomy for resection.  Operative note mention that purulent material was expressed.  Staples removed on 11/27. -ID consulted.  Devin Barker was initially on broad-spectrum antibiotics with vancomycin and cefepime.  Currently on continuous infusion of penicillin G.  -Gram stain was negative, follow cultures - preliminary surgical cultures showing 'PROPIONIBACTERIUM ACNES' - sensitivities pending AFB negative. Fungitel negative. Devin Barker is stable from neurological standpoint. ID to determine final antibiotic plan.  Waiting on susceptibility testing for doxycycline. PICC line has been placed.   Polysubstance abuse -UDS positive for opiates, cocaine, amphetamines, THC.  -Devin Barker has been complaining of transient pain in the eye, chest, leg, and arm - these are fleeting events lasting maybe seconds to a minute. No IV narcotics to be given. - Weaned off hydrocodone 08/20/22 - Continue OTC analgesics as  indicated Devin Barker would like the treatment team to refrain in discussing his polysubstance use with his family   NSVT Brief run of NSVT noted on telemetry.  Electrolytes reviewed.  Magnesium 2.0.  Potassium is 4.0.  No further episodes noted.  Telemetry was subsequently discontinued.  Nausea, vomiting Resolved   Chronic thrombocytosis Continue to follow, stable near baseline   Tobacco abuse Counseled.  History of ADHD/anxiety Devin Barker was started back on Adderall at his request.  However upon further questioning it appears that he has not taken this medication in 3 months.  Noted to be fidgety and anxious which could be from the Adderall.  This was discontinued.  Symptoms have improved. He continues to be on Xanax as needed and we discussed this is not an ideal maintenance med for him Increase Fluvoxamine 100mg  - wean down xanax over the next week as able  Class III obesity Estimated body mass index is 42.57 kg/m as calculated from the following:   Height as of this encounter: 5\' 7"  (1.702 m).   Weight as of this encounter: 123.3 kg.   DVT Prophylaxis: SCDs Code Status: Full code Family Communication: Discussed with Devin Barker Disposition Plan: Continue to mobilize.  Unsafe discharge until completion of IV antibiotics.  Status is: Inpatient Remains inpatient appropriate because: Brain abscess -requiring IV antibiotics, unsafe to discharge with PICC line due to history of drug abuse.  Medications: Scheduled:  carbamazepine  400 mg Oral Daily   Chlorhexidine Gluconate Cloth  6 each Topical Daily   fluvoxaMINE  100 mg Oral q AM   nicotine  7 mg Transdermal Daily   pantoprazole  40 mg Oral Daily   senna-docusate  1 tablet Oral BID   sodium chloride flush  10-40 mL Intracatheter Q12H   Continuous:  pencillin G potassium IV 4 Million Units (08/21/22 0459)   **OR** [DISCONTINUED] acetaminophen, ALPRAZolam, bisacodyl,  guaiFENesin-dextromethorphan, ibuprofen,  labetalol, magnesium citrate, menthol-cetylpyridinium, naLOXone (NARCAN)  injection, ondansetron **OR** ondansetron (ZOFRAN) IV, mouth rinse, polyethylene glycol, polyvinyl alcohol, promethazine, sodium chloride flush  Antibiotics: Anti-infectives (From admission, onward)    Start     Dose/Rate Route Frequency Ordered Stop   08/13/22 1600  penicillin G potassium 4 Million Units in dextrose 5 % 250 mL IVPB        4 Million Units 250 mL/hr over 60 Minutes Intravenous Every 4 hours 08/13/22 0956     08/07/22 1800  penicillin G potassium 12 Million Units in dextrose 5 % 500 mL continuous infusion        12 Million Units 41.7 mL/hr over 12 Hours Intravenous Every 12 hours 08/07/22 1704 08/13/22 1159   08/07/22 1300  penicillin G potassium 24 Million Units in dextrose 5 % 250 mL IVPB  Status:  Discontinued        24 Million Units 250 mL/hr over 60 Minutes Intravenous Every 24 hours 08/07/22 1146 08/07/22 1704   08/02/22 2200  vancomycin (VANCOREADY) IVPB 1750 mg/350 mL  Status:  Discontinued        1,750 mg 175 mL/hr over 120 Minutes Intravenous Every 8 hours 08/02/22 1434 08/02/22 1441   08/02/22 1530  vancomycin (VANCOREADY) IVPB 1750 mg/350 mL  Status:  Discontinued        1,750 mg 175 mL/hr over 120 Minutes Intravenous Every 8 hours 08/02/22 1441 08/04/22 0823   07/31/22 1200  vancomycin (VANCOREADY) IVPB 1250 mg/250 mL  Status:  Discontinued        1,250 mg 166.7 mL/hr over 90 Minutes Intravenous Every 8 hours 07/31/22 0352 08/02/22 1434   07/31/22 1200  ceFEPIme (MAXIPIME) 2 g in sodium chloride 0.9 % 100 mL IVPB  Status:  Discontinued        2 g 200 mL/hr over 30 Minutes Intravenous Every 8 hours 07/31/22 0352 08/07/22 1146   07/31/22 0400  metroNIDAZOLE (FLAGYL) IVPB 500 mg  Status:  Discontinued        500 mg 100 mL/hr over 60 Minutes Intravenous Every 6 hours 07/31/22 0329 08/07/22 1146   07/31/22 0245  vancomycin (VANCOREADY) IVPB 2000 mg/400 mL        2,000 mg 200 mL/hr over 120  Minutes Intravenous STAT 07/31/22 0230 07/31/22 0533   07/31/22 0245  ceFEPIme (MAXIPIME) 2 g in sodium chloride 0.9 % 100 mL IVPB        2 g 200 mL/hr over 30 Minutes Intravenous STAT 07/31/22 0230 07/31/22 0333   07/31/22 0215  vancomycin (VANCOCIN) IVPB 1000 mg/200 mL premix  Status:  Discontinued        1,000 mg 200 mL/hr over 60 Minutes Intravenous  Once 07/31/22 0214 07/31/22 0229       Objective:  Vital Signs  Vitals:   08/20/22 1236 08/20/22 2105 08/21/22 0100 08/21/22 0500  BP: 120/65 114/81 114/61 (!) 105/47  Pulse: 85 89 70 68  Resp: 18 20 18 16   Temp: (!) 97.5 F (36.4 C) 98.2 F (36.8 C) 98.4 F (36.9 C) 98.3 F (36.8 C)  TempSrc: Oral Oral Oral Oral  SpO2:  100% 99% 96%  Weight:      Height:       No intake or output data in the 24 hours ending 08/21/22 0734  Filed Weights   07/31/22 0300 08/08/22 0601 08/12/22 1711  Weight: 122.5 kg 120.2 kg 123.3 kg   Devin Barker is awake alert.  In no distress S1-S2 is normal regular.  Lungs are clear to auscultation bilaterally Abdomen is soft.  Not distended.   Lab Results:  Data Reviewed: I have personally reviewed following labs and reports of the imaging studies  CBC: Recent Labs  Lab 08/16/22 0615 08/19/22 0740  WBC 12.8* 12.0*  NEUTROABS  --  4.0  HGB 13.3 14.3  HCT 41.1 43.1  MCV 82.2 80.6  PLT 435* 419*     Basic Metabolic Panel: Recent Labs  Lab 08/16/22 0952 08/19/22 0740  NA 138 138  K 4.1 4.0  CL 102 104  CO2 25 26  GLUCOSE 161* 91  BUN 10 6  CREATININE 0.71 0.60*  CALCIUM 8.8* 8.7*  MG 1.9  --      GFR: Estimated Creatinine Clearance: 177.7 mL/min (A) (by C-G formula based on SCr of 0.6 mg/dL (L)).    LOS: 21 days   Little Ishikawa  [facility] Pager on [email or web address]  08/21/2022, 7:34 AM

## 2022-08-21 NOTE — TOC Progression Note (Signed)
Transition of Care [facility]) - Progression Note    Patient Details  Name: Carson Bogden MRN: [id number] Date of Birth: 1996-06-17  [facility] CM/SW Contact  Kermit Balo, RN Phone Number: 08/21/2022, 11:20 AM  Clinical Narrative:    Waiting on susceptibility testing for doxycycline. Pt has PICC and IV abx for 6 weeks unless can do the doxycyline treatment.  TOC following.  Expected Discharge Plan: Home/Self Care Barriers to Discharge: Continued Medical Work up  Expected Discharge Plan and Services Expected Discharge Plan: Home/Self Care   Discharge Planning Services: CM Consult   Living arrangements for the past 2 months: Single Family Home                                       Social Determinants of Health (SDOH) Interventions    Readmission Risk Interventions     No data to display
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
## 2022-08-22 ENCOUNTER — Other Ambulatory Visit (HOSPITAL_COMMUNITY): Payer: Self-pay

## 2022-08-22 ENCOUNTER — Inpatient Hospital Stay (HOSPITAL_COMMUNITY): Payer: BLUE CROSS/BLUE SHIELD

## 2022-08-22 DIAGNOSIS — F319 Bipolar disorder, unspecified: Secondary | ICD-10-CM

## 2022-08-22 DIAGNOSIS — F988 Other specified behavioral and emotional disorders with onset usually occurring in childhood and adolescence: Secondary | ICD-10-CM

## 2022-08-22 DIAGNOSIS — F908 Attention-deficit hyperactivity disorder, other type: Secondary | ICD-10-CM

## 2022-08-22 DIAGNOSIS — F1990 Other psychoactive substance use, unspecified, uncomplicated: Secondary | ICD-10-CM

## 2022-08-22 LAB — MISC LABCORP TEST (SEND OUT): Labcorp test code: 824562

## 2022-08-22 LAB — HEPATITIS B SURFACE ANTIBODY, QUANTITATIVE: Hep B S AB Quant (Post): 3.2 m[IU]/mL — ABNORMAL LOW (ref >9.9)

## 2022-08-22 LAB — CULTURE, FUNGUS WITHOUT SMEAR

## 2022-08-22 LAB — PATHOLOGIST SMEAR REVIEW

## 2022-08-22 LAB — HCV INTERPRETATION

## 2022-08-22 LAB — HCV AB W REFLEX TO QUANT PCR: HCV Ab: NONREACTIVE

## 2022-08-22 MED ORDER — PANTOPRAZOLE SODIUM 40 MG PO TBEC
40.0000 mg | DELAYED_RELEASE_TABLET | Freq: Every day | ORAL | 0 refills | Status: DC
  Filled 2022-08-22: qty 30, 30d supply, fill #0

## 2022-08-22 MED ORDER — GADOBUTROL 1 MMOL/ML IV SOLN
10.0000 mL | Freq: Once | INTRAVENOUS | Status: AC | PRN
  Administered 2022-08-22: 10 mL via INTRAVENOUS

## 2022-08-22 MED ORDER — FLUVOXAMINE MALEATE 100 MG PO TABS
100.0000 mg | ORAL_TABLET | Freq: Every morning | ORAL | 0 refills | Status: DC
  Filled 2022-08-22: qty 30, 30d supply, fill #0

## 2022-08-22 MED ORDER — LINEZOLID 600 MG PO TABS
600.0000 mg | ORAL_TABLET | Freq: Two times a day (BID) | ORAL | 0 refills | Status: DC
  Filled 2022-08-22: qty 60, 30d supply, fill #0

## 2022-08-22 NOTE — Progress Notes (Addendum)
        MRI report  IMPRESSION: 1. Status post interval right frontoparietal craniectomy and calvarial abscess resection with significantly improved dural thickening and enhancement along the right frontal lobe. 2. Evaluation of the resection site is somewhat limited by susceptibility artifact from closure hardware. Within this limitation, no significant surrounding inflammatory changes or definite enhancement. 3. No acute intracranial process   He can be DC ONCE he has zyvox on hand  He has followup scheduled with Dr. Renold Don  I will sign off.  Please call with further questions.  08/22/2022, 3:18 PM

## 2022-08-22 NOTE — TOC Transition Note (Signed)
Transition of Care [facility]) - CM/SW Discharge Note   Patient Details  Name: Devin Barker MRN: [id number] Date of Birth: 1996-02-05  Transition of Care [facility]) CM/SW Contact:  Kermit Balo, RN Phone Number: 08/22/2022, 3:47 PM   Clinical Narrative:    Pt is discharging home with self care. No needs per TOC.  Zyvox to be delivered to the room per [facility] pharmacy.    Final next level of care: Home/Self Care Barriers to Discharge: No Barriers Identified   Patient Goals and CMS Choice        Discharge Placement                       Discharge Plan and Services   Discharge Planning Services: CM Consult                                 Social Determinants of Health (SDOH) Interventions     Readmission Risk Interventions     No data to display
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
## 2022-08-22 NOTE — Progress Notes (Signed)
Arrived to patient's room. Instructed on procedure. HOB less than 45*. Pt held breath upon line removal. Pressure held for , pressure drsg applied, no s/sx of bleeding at this time. Instructed patient to remain in bed for . Keeping drsg CDI for 24 hours, monitoring and reporting any s/sx of bleeding/ putting pressure directly to site to stop bleeding. Not to lift for than wt of gallon of milk. Pt VU. Tomasita Morrow, RN VAST

## 2022-08-22 NOTE — Progress Notes (Signed)
Patient ID: Devin Barker, male   DOB: 02-18-1996, 26 y.o.   MRN: [id number] BP (!) 120/55 (BP Location: Right Arm)   Pulse 79   Temp 98.4 F (36.9 C) (Oral)   Resp 20   Ht 5\' 7"  (1.702 m)   Wt 123.3 kg   SpO2 98%   BMI 42.57 kg/m  MRI reviewed. Total resection. Less enhancement along the dura. No further operative plans, nor reason for further imaging unless there is a local change/neurological change

## 2022-08-22 NOTE — Progress Notes (Signed)
[facility] PROGRESS NOTE   Devin Barker [id number] DOB: 07/10/1996 DOA: 07/30/2022  PCP: Patient, No Pcp Per  Brief History/Interval Summary: 26 year old male without significant past medical history who comes into the hospital with right-sided headache and vomiting.  Imaging in the emergency room shows soft tissue mass, skull defect with some erosion and dural enhancement, concerning for abscess.  Patient's urine drug screen was positive for opioids, cocaine, amphetamines, THC.  He admits to shooting cocaine.  Patient was hospitalized for further management.  Consultants: Neurosurgery.  Infectious disease  Procedures: Right craniectomy/abscess evacuation  Subjective/Interval History: No acute issues or events overnight, staples previously removed denies nausea vomiting diarrhea constipation headache fevers chills chest pain  Assessment/Plan:  Intracranial abscess/temporalis infection -Dr. Christella Noa consulted, patient was taken to the OR on 11/13 status post right craniectomy for resection.  Operative note mention that purulent material was expressed.  Staples removed on 11/27. -ID consulted.  Patient was initially on broad-spectrum antibiotics with vancomycin and cefepime.  Currently on continuous infusion of penicillin G.  -Gram stain was negative, follow cultures - preliminary surgical cultures showing 'PROPIONIBACTERIUM ACNES' - sensitivities pending from [facility] clinic - 12/4-12/11 was estimated turnaround time for results. AFB negative. Fungitel negative. Patient is stable from neurological standpoint. ID to determine final antibiotic plan.  Waiting on susceptibility testing for doxycycline. PICC line has been placed.   Polysubstance abuse -UDS positive for opiates, cocaine, amphetamines, THC.  -Patient has been complaining of transient pain in the eye, chest, leg, and arm - these are fleeting events lasting maybe seconds to a minute. No IV narcotics to  be given. - Weaned off hydrocodone 08/20/22 - Continue OTC analgesics as indicated Patient would like the treatment team to refrain in discussing his polysubstance use with his family   NSVT Brief run of NSVT noted on telemetry.  Electrolytes reviewed.  Magnesium 2.0.  Potassium is 4.0.  No further episodes noted.  Telemetry was subsequently discontinued.  Nausea, vomiting Resolved   Chronic thrombocytosis Continue to follow, stable near baseline   Tobacco abuse Counseled.  History of ADHD/anxiety Patient was started back on Adderall at his request.  However upon further questioning it appears that he has not taken this medication in 3 months.  Noted to be fidgety and anxious which could be from the Adderall.  This was discontinued.  Symptoms have improved. He continues to be on Xanax as needed and we discussed this is not an ideal maintenance med for him Increase Fluvoxamine 100mg  - wean down xanax over the next week as able  Class III obesity Estimated body mass index is 42.57 kg/m as calculated from the following:   Height as of this encounter: 5\' 7"  (1.702 m).   Weight as of this encounter: 123.3 kg.   DVT Prophylaxis: SCDs Code Status: Full code Family Communication: Discussed with patient Disposition Plan: Continue to mobilize.  Unsafe discharge until completion of IV antibiotics.  Status is: Inpatient Remains inpatient appropriate because: Brain abscess -requiring IV antibiotics, unsafe to discharge with PICC line due to history of drug abuse.  Medications: Scheduled:  carbamazepine  400 mg Oral Daily   Chlorhexidine Gluconate Cloth  6 each Topical Daily   fluvoxaMINE  100 mg Oral q AM   nicotine  7 mg Transdermal Daily   pantoprazole  40 mg Oral Daily   senna-docusate  1 tablet Oral BID   sodium chloride flush  10-40 mL Intracatheter Q12H   Continuous:  pencillin G potassium IV 4 Million  Units (08/22/22 0358)   [id number] **OR** [DISCONTINUED]  acetaminophen, ALPRAZolam, bisacodyl, guaiFENesin-dextromethorphan, ibuprofen, labetalol, magnesium citrate, menthol-cetylpyridinium, naLOXone (NARCAN)  injection, ondansetron **OR** ondansetron (ZOFRAN) IV, mouth rinse, polyethylene glycol, polyvinyl alcohol, promethazine, sodium chloride flush  Antibiotics: Anti-infectives (From admission, onward)    Start     Dose/Rate Route Frequency Ordered Stop   08/13/22 1600  penicillin G potassium 4 Million Units in dextrose 5 % 250 mL IVPB        4 Million Units 250 mL/hr over 60 Minutes Intravenous Every 4 hours 08/13/22 0956     08/07/22 1800  penicillin G potassium 12 Million Units in dextrose 5 % 500 mL continuous infusion        12 Million Units 41.7 mL/hr over 12 Hours Intravenous Every 12 hours 08/07/22 1704 08/13/22 1159   08/07/22 1300  penicillin G potassium 24 Million Units in dextrose 5 % 250 mL IVPB  Status:  Discontinued        24 Million Units 250 mL/hr over 60 Minutes Intravenous Every 24 hours 08/07/22 1146 08/07/22 1704   08/02/22 2200  vancomycin (VANCOREADY) IVPB 1750 mg/350 mL  Status:  Discontinued        1,750 mg 175 mL/hr over 120 Minutes Intravenous Every 8 hours 08/02/22 1434 08/02/22 1441   08/02/22 1530  vancomycin (VANCOREADY) IVPB 1750 mg/350 mL  Status:  Discontinued        1,750 mg 175 mL/hr over 120 Minutes Intravenous Every 8 hours 08/02/22 1441 08/04/22 0823   07/31/22 1200  vancomycin (VANCOREADY) IVPB 1250 mg/250 mL  Status:  Discontinued        1,250 mg 166.7 mL/hr over 90 Minutes Intravenous Every 8 hours 07/31/22 0352 08/02/22 1434   07/31/22 1200  ceFEPIme (MAXIPIME) 2 g in sodium chloride 0.9 % 100 mL IVPB  Status:  Discontinued        2 g 200 mL/hr over 30 Minutes Intravenous Every 8 hours 07/31/22 0352 08/07/22 1146   07/31/22 0400  metroNIDAZOLE (FLAGYL) IVPB 500 mg  Status:  Discontinued        500 mg 100 mL/hr over 60 Minutes Intravenous Every 6 hours 07/31/22 0329 08/07/22 1146   07/31/22 0245   vancomycin (VANCOREADY) IVPB 2000 mg/400 mL        2,000 mg 200 mL/hr over 120 Minutes Intravenous STAT 07/31/22 0230 07/31/22 0533   07/31/22 0245  ceFEPIme (MAXIPIME) 2 g in sodium chloride 0.9 % 100 mL IVPB        2 g 200 mL/hr over 30 Minutes Intravenous STAT 07/31/22 0230 07/31/22 0333   07/31/22 0215  vancomycin (VANCOCIN) IVPB 1000 mg/200 mL premix  Status:  Discontinued        1,000 mg 200 mL/hr over 60 Minutes Intravenous  Once 07/31/22 0214 07/31/22 0229       Objective:  Vital Signs  Vitals:   08/21/22 1537 08/21/22 1955 08/22/22 0006 08/22/22 0401  BP: 122/84 (!) 124/59 (!) 103/51 (!) 109/58  Pulse: 84 99 91 67  Resp: 20     Temp: 98.7 F (37.1 C) 98.3 F (36.8 C) 98.3 F (36.8 C) 98.7 F (37.1 C)  TempSrc: Oral Oral Oral Oral  SpO2: 98% 97% 96% 95%  Weight:      Height:       No intake or output data in the 24 hours ending 08/22/22 0715  Filed Weights   07/31/22 0300 08/08/22 0601 08/12/22 1711  Weight: 122.5 kg 120.2 kg 123.3 kg  Patient is awake alert.  In no distress S1-S2 is normal regular. Lungs are clear to auscultation bilaterally Abdomen is soft.  Not distended.   Lab Results:  Data Reviewed: I have personally reviewed following labs and reports of the imaging studies  CBC: Recent Labs  Lab 08/16/22 0615 08/19/22 0740  WBC 12.8* 12.0*  NEUTROABS  --  4.0  HGB 13.3 14.3  HCT 41.1 43.1  MCV 82.2 80.6  PLT 435* 419*     Basic Metabolic Panel: Recent Labs  Lab 08/16/22 0952 08/19/22 0740  NA 138 138  K 4.1 4.0  CL 102 104  CO2 25 26  GLUCOSE 161* 91  BUN 10 6  CREATININE 0.71 0.60*  CALCIUM 8.8* 8.7*  MG 1.9  --      GFR: Estimated Creatinine Clearance: 177.7 mL/min (A) (by C-G formula based on SCr of 0.6 mg/dL (L)).    LOS: 22 days   Little Ishikawa  [facility] Pager on [email or web address]  08/22/2022, 7:15 AM

## 2022-08-25 LAB — AEROBIC/ANAEROBIC CULTURE W GRAM STAIN (SURGICAL/DEEP WOUND)

## 2022-09-15 LAB — ACID FAST CULTURE WITH REFLEXED SENSITIVITIES (MYCOBACTERIA): Acid Fast Culture: NEGATIVE

## 2022-09-16 LAB — ACID FAST CULTURE WITH REFLEXED SENSITIVITIES (MYCOBACTERIA): Acid Fast Culture: NEGATIVE

## 2022-09-20 ENCOUNTER — Ambulatory Visit: Payer: BLUE CROSS/BLUE SHIELD | Admitting: Internal Medicine

## 2022-09-21 ENCOUNTER — Encounter: Payer: Self-pay | Admitting: Internal Medicine

## 2022-09-21 ENCOUNTER — Ambulatory Visit (INDEPENDENT_AMBULATORY_CARE_PROVIDER_SITE_OTHER): Payer: BLUE CROSS/BLUE SHIELD | Admitting: Internal Medicine

## 2022-09-21 ENCOUNTER — Other Ambulatory Visit: Payer: Self-pay

## 2022-09-21 VITALS — BP 133/80 | HR 88 | Temp 97.6°F | Resp 97 | Ht 67.0 in | Wt 286.0 lb

## 2022-09-21 DIAGNOSIS — G060 Intracranial abscess and granuloma: Secondary | ICD-10-CM | POA: Diagnosis not present

## 2022-09-21 MED ORDER — LINEZOLID 600 MG PO TABS: 600.0000 mg | ORAL_TABLET | Freq: Two times a day (BID) | ORAL | 0 refills | Status: DC

## 2022-09-21 NOTE — Progress Notes (Signed)
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 M With past medical history of IVDA, knee dislocation, obesity presents for hospital follow-up of skull abscess status post right craniectomy with massive sections, cultures growing P acnes.  Patient reports there was a mass there for a couple months with headache.  He noted the mass after a fall.  MRI showed peripheral enhancing collection along the right frontal lobe concerning for abscess versus neoplasm.  Started on vancomycin, cefepime, metronidazole.Taken to OR on 11/14 for right craniectomy with NSY.  Cultures eventually grew P acnes.  Patient was transitioned to penicillin.  Given his a history of IVDA he was discharged on linezolid x 1 month.  He received IV antibiotics while hospitalized 11/14-12/5. Today 14/24:Pt reports feel emotionally labile. He reports IVDA was prior to hospitalization. Pt has not gone back to work.  Missed a couple of doses of abx.  Antibiotics: Pcn g 11/20-12/5 Linezolid 12/5- Vanc+ cefepime, metro 11/12-11/20 Review of Systems  All  other systems reviewed and are negative.   Past Medical History:  Diagnosis Date   Knee dislocation    Obesity     Outpatient Medications Prior to Visit  Medication Sig Dispense Refill   carbamazepine (TEGRETOL XR) 400 MG 12 hr tablet Take 400 mg by mouth daily. (Patient not taking: Reported on 08/09/2022)     fluvoxaMINE (LUVOX) 100 MG tablet Take 1 tablet (100 mg total) by mouth in the morning. 30 tablet 0   linezolid (ZYVOX) 600 MG tablet Take 1 tablet (600 mg total) by mouth 2 (two) times daily. 60 tablet 0   pantoprazole (PROTONIX) 40 MG tablet Take 1 tablet (40 mg total) by mouth daily. 30 tablet 0   No facility-administered medications prior to visit.     Allergies  Allergen Reactions   Other     Pt prefers medications that do not have gelatins. No capsules, gel caps etc....due to religious reasons  Pt also had a reaction to laughing gas (liquid form)     Social History   Tobacco Use   Smoking status: Every Day    Packs/day: 1.50    Types: Cigarettes   Smokeless tobacco: Current    Types: Chew  Substance Use Topics   Alcohol use: No    Comment: occasionally   Drug use: Yes    Types: Methamphetamines, Heroin, Marijuana, Fentanyl    Comment: last dose yesterday    Family History  Problem Relation Age of Onset   Diabetes Mother    Hyperlipidemia Mother    Hypertension Father    Kidney disease Sister  Diabetes Maternal Grandmother    Diabetes Paternal Grandfather     Objective:   Vitals:   09/21/22 1014  Weight: 286 lb (129.7 kg)  Height: 5\' 7"  (1.702 m)   Body mass index is 44.79 kg/m.  Physical Exam Constitutional:      General: He is not in acute distress.    Appearance: He is normal weight. He is not toxic-appearing.  HENT:     Head: Normocephalic and atraumatic.     Right Ear: External ear normal.     Left Ear: External ear normal.     Nose: No congestion or rhinorrhea.     Mouth/Throat:     Mouth: Mucous membranes are moist.      Pharynx: Oropharynx is clear.  Eyes:     Extraocular Movements: Extraocular movements intact.     Conjunctiva/sclera: Conjunctivae normal.     Pupils: Pupils are equal, round, and reactive to light.  Cardiovascular:     Rate and Rhythm: Normal rate and regular rhythm.     Heart sounds: No murmur heard.    No friction rub. No gallop.  Pulmonary:     Effort: Pulmonary effort is normal.     Breath sounds: Normal breath sounds.  Abdominal:     General: Abdomen is flat. Bowel sounds are normal.     Palpations: Abdomen is soft.  Musculoskeletal:        General: No swelling. Normal range of motion.     Cervical back: Normal range of motion and neck supple.  Skin:    General: Skin is warm and dry.  Neurological:     General: No focal deficit present.     Mental Status: He is oriented to person, place, and time.  Psychiatric:        Mood and Affect: Mood normal.     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 -Repeat MRI seem stable -I think given MRI findings on 12/5(significantly improved dural thickening and enhancement along the right frontal lobe) of purulence+ P acnes, would like to do 3 weeks of abx(he only had 2 wekes of IV therapy).  Given that MRI findings  were, I think he cannot tolerate the linezolid or has an increase in side effects.  Patient has completed about 45 days of therapy at this point. -Follow-up with ID in 2 weeks for labs.,  Will plan on another month of linezolid.  #Emotional lability -Refer Neurology in the setting of recent craniectomy. Hr reports he is more angry since hospitalization.  -Refer to behavioral health, pt thinks that his emotional lability may be due to life changes(fled from [place] this year) -Labs today  as pt is on linezolid -Continue linezolid to complete 3 months of antibiotic therapy -f/u 2 weeks of labs  #Immunization --covid booster   Danelle Earthly, MD [facility]   09/21/22  10:18 AM

## 2022-09-22 LAB — CBC WITH DIFFERENTIAL/PLATELET
Absolute Monocytes: 467 cells/uL (ref 200–950)
Basophils Absolute: 160 cells/uL (ref 0–200)
Basophils Relative: 1.3 %
Eosinophils Absolute: 4317 cells/uL — ABNORMAL HIGH (ref 15–500)
Eosinophils Relative: 35.1 %
HCT: 41.9 % (ref 38.5–50.0)
Hemoglobin: 13.8 g/dL (ref 13.2–17.1)
Lymphs Abs: 3973 cells/uL — ABNORMAL HIGH (ref 850–3900)
MCH: 26.8 pg — ABNORMAL LOW (ref 27.0–33.0)
MCHC: 32.9 g/dL (ref 32.0–36.0)
MCV: 81.4 fL (ref 80.0–100.0)
MPV: 9.5 fL (ref 7.5–12.5)
Monocytes Relative: 3.8 %
Neutro Abs: 3383 cells/uL (ref 1500–7800)
Neutrophils Relative %: 27.5 %
Platelets: 353 10*3/uL (ref 140–400)
RBC: 5.15 10*6/uL (ref 4.20–5.80)
RDW: 14.6 % (ref 11.0–15.0)
Total Lymphocyte: 32.3 %
WBC: 12.3 10*3/uL — ABNORMAL HIGH (ref 3.8–10.8)

## 2022-09-22 LAB — COMPREHENSIVE METABOLIC PANEL
AG Ratio: 1.4 (calc) (ref 1.0–2.5)
ALT: 14 U/L (ref 9–46)
AST: 11 U/L (ref 10–40)
Albumin: 3.9 g/dL (ref 3.6–5.1)
Alkaline phosphatase (APISO): 82 U/L (ref 36–130)
BUN: 10 mg/dL (ref 7–25)
CO2: 22 mmol/L (ref 20–32)
Calcium: 8.6 mg/dL (ref 8.6–10.3)
Chloride: 107 mmol/L (ref 98–110)
Creat: 0.67 mg/dL (ref 0.60–1.24)
Globulin: 2.8 g/dL (calc) (ref 1.9–3.7)
Glucose, Bld: 119 mg/dL — ABNORMAL HIGH (ref 65–99)
Potassium: 4.1 mmol/L (ref 3.5–5.3)
Sodium: 139 mmol/L (ref 135–146)
Total Bilirubin: 0.2 mg/dL (ref 0.2–1.2)
Total Protein: 6.7 g/dL (ref 6.1–8.1)

## 2022-09-22 LAB — SEDIMENTATION RATE: Sed Rate: 9 mm/h (ref 0–15)

## 2022-09-22 LAB — C-REACTIVE PROTEIN: CRP: 26.8 mg/L — ABNORMAL HIGH (ref <8.0)

## 2022-10-03 ENCOUNTER — Other Ambulatory Visit: Payer: Self-pay

## 2022-10-03 ENCOUNTER — Ambulatory Visit: Payer: BLUE CROSS/BLUE SHIELD | Admitting: Internal Medicine

## 2022-10-03 ENCOUNTER — Other Ambulatory Visit (HOSPITAL_COMMUNITY): Payer: Self-pay

## 2022-10-03 ENCOUNTER — Other Ambulatory Visit: Payer: Self-pay | Admitting: Internal Medicine

## 2022-10-03 ENCOUNTER — Other Ambulatory Visit: Payer: BLUE CROSS/BLUE SHIELD

## 2022-10-03 DIAGNOSIS — G060 Intracranial abscess and granuloma: Secondary | ICD-10-CM

## 2022-10-04 LAB — CBC WITH DIFFERENTIAL/PLATELET
Absolute Monocytes: 857 cells/uL (ref 200–950)
Basophils Absolute: 138 cells/uL (ref 0–200)
Basophils Relative: 0.9 %
Eosinophils Absolute: 3657 cells/uL — ABNORMAL HIGH (ref 15–500)
Eosinophils Relative: 23.9 %
HCT: 44.9 % (ref 38.5–50.0)
Hemoglobin: 14.5 g/dL (ref 13.2–17.1)
Lymphs Abs: 5722 cells/uL — ABNORMAL HIGH (ref 850–3900)
MCH: 26.2 pg — ABNORMAL LOW (ref 27.0–33.0)
MCHC: 32.3 g/dL (ref 32.0–36.0)
MCV: 81 fL (ref 80.0–100.0)
MPV: 9.7 fL (ref 7.5–12.5)
Monocytes Relative: 5.6 %
Neutro Abs: 4927 cells/uL (ref 1500–7800)
Neutrophils Relative %: 32.2 %
Platelets: 588 10*3/uL — ABNORMAL HIGH (ref 140–400)
RBC: 5.54 10*6/uL (ref 4.20–5.80)
RDW: 14.8 % (ref 11.0–15.0)
Total Lymphocyte: 37.4 %
WBC: 15.3 10*3/uL — ABNORMAL HIGH (ref 3.8–10.8)

## 2022-10-04 LAB — COMPREHENSIVE METABOLIC PANEL
AG Ratio: 1.2 (calc) (ref 1.0–2.5)
ALT: 14 U/L (ref 9–46)
AST: 11 U/L (ref 10–40)
Albumin: 4.2 g/dL (ref 3.6–5.1)
Alkaline phosphatase (APISO): 85 U/L (ref 36–130)
BUN: 8 mg/dL (ref 7–25)
CO2: 23 mmol/L (ref 20–32)
Calcium: 9.1 mg/dL (ref 8.6–10.3)
Chloride: 103 mmol/L (ref 98–110)
Creat: 0.69 mg/dL (ref 0.60–1.24)
Globulin: 3.4 g/dL (calc) (ref 1.9–3.7)
Glucose, Bld: 69 mg/dL (ref 65–99)
Potassium: 3.9 mmol/L (ref 3.5–5.3)
Sodium: 137 mmol/L (ref 135–146)
Total Bilirubin: 0.4 mg/dL (ref 0.2–1.2)
Total Protein: 7.6 g/dL (ref 6.1–8.1)

## 2022-10-04 LAB — SEDIMENTATION RATE: Sed Rate: 14 mm/h (ref 0–15)

## 2022-10-04 LAB — C-REACTIVE PROTEIN: CRP: 14.2 mg/L — ABNORMAL HIGH (ref <8.0)

## 2022-10-17 ENCOUNTER — Ambulatory Visit (INDEPENDENT_AMBULATORY_CARE_PROVIDER_SITE_OTHER): Payer: BLUE CROSS/BLUE SHIELD | Admitting: Internal Medicine

## 2022-10-17 ENCOUNTER — Encounter: Payer: Self-pay | Admitting: Internal Medicine

## 2022-10-17 ENCOUNTER — Other Ambulatory Visit: Payer: Self-pay

## 2022-10-17 VITALS — BP 127/83 | HR 97 | Temp 98.0°F | Resp 16 | Wt 280.0 lb

## 2022-10-17 DIAGNOSIS — G060 Intracranial abscess and granuloma: Secondary | ICD-10-CM | POA: Diagnosis not present

## 2022-10-17 NOTE — Progress Notes (Signed)
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   FLUVOXAMINE (LUVOX) 100 MG TABLET    Take 1 tablet (100 mg total) by mouth in the morning.   LINEZOLID (ZYVOX) 600 MG TABLET    Take 1 tablet (600 mg total) by mouth 2 (two) times daily.   PANTOPRAZOLE (PROTONIX) 40 MG TABLET    Take 1 tablet (40 mg total) by mouth daily.  Modified Medications   No medications on file  Discontinued Medications   No medications on file    Subjective: [name] with  past medical history of IVDA, knee dislocation, obesity presents for hospital follow-up of skull abscess status post right craniectomy with massive sections, cultures growing P acnes.  Patient reports there was a mass there for a couple months with headache.  He noted the mass after a fall.  MRI showed peripheral enhancing collection along the right frontal lobe concerning for abscess versus neoplasm.  Started on vancomycin, cefepime, metronidazole.Taken to OR on 11/14 for right craniectomy with NSY.  Cultures eventually grew P acnes.  Patient was transitioned to penicillin.  Given his a history of IVDA he was discharged on linezolid x 1 month.  He received IV antibiotics while hospitalized 11/14-12/5. / 09/21/22:Pt reports feel emotionally  labile. He reports IVDA was prior to hospitalization. Pt has not gone back to work.  Missed a couple of doses of abx. Today 10/17/22: Pt reports he has appointment with behavioral health. He denies HA, fever and chills Review of Systems: Review of Systems  All other systems reviewed and are negative.   Past Medical History:  Diagnosis Date   Knee dislocation    Obesity     Social History   Tobacco Use   Smoking status: Every Day    Packs/day: 1.50    Types: Cigarettes   Smokeless tobacco: Current    Types: Chew  Substance Use Topics   Alcohol use: No    Comment: occasionally   Drug use: Yes    Types: Methamphetamines, Heroin, Marijuana, Fentanyl    Comment: last dose yesterday    Family History  Problem Relation Age of Onset   Diabetes Mother    Hyperlipidemia Mother    Hypertension Father    Kidney disease Sister    Diabetes Maternal Grandmother    Diabetes Paternal Grandfather     Allergies  Allergen Reactions   Other     Pt prefers medications that do not have gelatins. No capsules, gel caps etc....due to religious reasons  Pt also had a reaction to laughing gas (liquid form)     Health Maintenance  Topic Date Due   COVID-19 Vaccine (1) Never done   HPV VACCINES (1 - Male 2-dose series) Never done   INFLUENZA  VACCINE  Never done   DTaP/Tdap/Td (2 - Td or Tdap) 07/31/2032   Hepatitis C Screening  Completed   HIV Screening  Completed    Objective:  Vitals:   10/17/22 1103  BP: 127/83  Pulse: 97  Resp: 16  Temp: 98 F (36.7 C)  TempSrc: Temporal  SpO2: 99%  Weight: 280 lb (127 kg)   Body mass index is 43.85 kg/m.  Physical Exam Constitutional:      General: He is not in acute distress.    Appearance: He is normal weight. He is not toxic-appearing.  HENT:     Head: Normocephalic and atraumatic.     Right Ear: External ear normal.     Left Ear: External ear normal.     Nose: No congestion or rhinorrhea.     Mouth/Throat:     Mouth:  Mucous membranes are moist.     Pharynx: Oropharynx is clear.  Eyes:     Extraocular Movements: Extraocular movements intact.     Conjunctiva/sclera: Conjunctivae normal.     Pupils: Pupils are equal, round, and reactive to light.  Cardiovascular:     Rate and Rhythm: Normal rate and regular rhythm.     Heart sounds: No murmur heard.    No friction rub. No gallop.  Pulmonary:     Effort: Pulmonary effort is normal.     Breath sounds: Normal breath sounds.  Abdominal:     General: Abdomen is flat. Bowel sounds are normal.     Palpations: Abdomen is soft.  Musculoskeletal:        General: No swelling. Normal range of motion.     Cervical back: Normal range of motion and neck supple.  Skin:    General: Skin is warm and dry.  Neurological:     General: No focal deficit present.     Mental Status: He is oriented to person, place, and time.  Psychiatric:        Mood and Affect: Mood normal.    Lab Results Lab Results  Component Value Date   WBC 15.3 (H) 10/03/2022   HGB 14.5 10/03/2022   HCT 44.9 10/03/2022   MCV 81.0 10/03/2022   PLT 588 (H) 10/03/2022    Lab Results  Component Value Date   CREATININE 0.69 10/03/2022   BUN 8 10/03/2022   NA 137 10/03/2022   K 3.9 10/03/2022   CL 103 10/03/2022   CO2 23 10/03/2022    Lab Results  Component Value Date   ALT 14 10/03/2022   AST 11 10/03/2022   ALKPHOS 51 08/19/2022   BILITOT 0.4 10/03/2022    Lab Results  Component Value Date   CHOL 157 04/06/2022   HDL 60 04/06/2022   LDLCALC 84 04/06/2022   TRIG 57 04/06/2022   CHOLHDL 2.6 04/06/2022   Lab Results  Component Value Date   LABRPR NON REACTIVE 08/01/2022   No results found for: "HIV1RNAQUANT", "HIV1RNAVL", "CD4TABS"   A/P  #Persistent leukocytosis # 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 -Repeat MRI seem stable -I think given MRI findings on 12/5(significantly improved dural thickening and enhancement along the right frontal lobe) of purulence+ P acnes, would like to do 3 weeks of abx(he only had 2 wekes of IV therapy).   -The plan was to continue  abx to complete 3 months(ideally) unless he cannot tolerate the linezolid or has an increase in side effects.  Patient has completed about 72 days of therapy at this point. No focal deficit, fever and chills. The rising wbc is interesting as he has no other symptoms.  Plan: -labs today -if wbc trend down then stop abx. If wbc elevated then MRI brain. Pt has trip to [place] tuesday next week. Counseled if wbc  elevated he will need to change trip plan. He understood and agreed  with plan. I will give him f/u with me in a month as place holder.    #Emotional lability -Refered Neurology in the setting of recent craniectomy. Hr reports he is more angry since hospitalization.  -Refer to behavioral health, pt thinks that his emotional lability may be due to life changes(fled from [place] this year). Pt sees Dr. Adele Schilder tomorrow.    Laurice Record, MD [facility] 10/17/2022, 11:10 AM   I have personally spent 71 minutes involved in face-to-face and non-face-to-face activities for this patient on the day of the visit. Professional time spent includes the following activities: Preparing to see the patient (review of tests), Obtaining and/or reviewing separately obtained history (admission/discharge record), Performing a medically appropriate examination and/or evaluation , Ordering medications/tests/procedures, referring and communicating with other health care professionals, Documenting clinical information in the EMR, Independently interpreting results (not separately reported), Communicating results to the  patient/family/caregiver, Counseling and educating the patient/family/caregiver and Care coordination (not separately reported).

## 2022-10-18 ENCOUNTER — Telehealth: Payer: Self-pay | Admitting: Internal Medicine

## 2022-10-18 ENCOUNTER — Encounter (HOSPITAL_COMMUNITY): Payer: Self-pay | Admitting: Psychiatry

## 2022-10-18 ENCOUNTER — Ambulatory Visit (HOSPITAL_BASED_OUTPATIENT_CLINIC_OR_DEPARTMENT_OTHER): Payer: BLUE CROSS/BLUE SHIELD | Admitting: Psychiatry

## 2022-10-18 VITALS — Wt 280.0 lb

## 2022-10-18 DIAGNOSIS — F902 Attention-deficit hyperactivity disorder, combined type: Secondary | ICD-10-CM

## 2022-10-18 DIAGNOSIS — F411 Generalized anxiety disorder: Secondary | ICD-10-CM | POA: Diagnosis not present

## 2022-10-18 DIAGNOSIS — G060 Intracranial abscess and granuloma: Secondary | ICD-10-CM

## 2022-10-18 DIAGNOSIS — F319 Bipolar disorder, unspecified: Secondary | ICD-10-CM

## 2022-10-18 DIAGNOSIS — F1910 Other psychoactive substance abuse, uncomplicated: Secondary | ICD-10-CM

## 2022-10-18 LAB — CBC WITH DIFFERENTIAL/PLATELET
Absolute Monocytes: 554 cells/uL (ref 200–950)
Basophils Absolute: 145 cells/uL (ref 0–200)
Basophils Relative: 1.1 %
Eosinophils Absolute: 3326 cells/uL — ABNORMAL HIGH (ref 15–500)
Eosinophils Relative: 25.2 %
HCT: 46 % (ref 38.5–50.0)
Hemoglobin: 14.9 g/dL (ref 13.2–17.1)
Lymphs Abs: 4066 cells/uL — ABNORMAL HIGH (ref 850–3900)
MCH: 26.3 pg — ABNORMAL LOW (ref 27.0–33.0)
MCHC: 32.4 g/dL (ref 32.0–36.0)
MCV: 81.1 fL (ref 80.0–100.0)
MPV: 9.4 fL (ref 7.5–12.5)
Monocytes Relative: 4.2 %
Neutro Abs: 5108 cells/uL (ref 1500–7800)
Neutrophils Relative %: 38.7 %
Platelets: 569 10*3/uL — ABNORMAL HIGH (ref 140–400)
RBC: 5.67 10*6/uL (ref 4.20–5.80)
RDW: 14.8 % (ref 11.0–15.0)
Total Lymphocyte: 30.8 %
WBC: 13.2 10*3/uL — ABNORMAL HIGH (ref 3.8–10.8)

## 2022-10-18 LAB — COMPLETE METABOLIC PANEL WITH GFR
AG Ratio: 1.2 (calc) (ref 1.0–2.5)
ALT: 20 U/L (ref 9–46)
AST: 13 U/L (ref 10–40)
Albumin: 4.2 g/dL (ref 3.6–5.1)
Alkaline phosphatase (APISO): 77 U/L (ref 36–130)
BUN: 8 mg/dL (ref 7–25)
CO2: 26 mmol/L (ref 20–32)
Calcium: 9.4 mg/dL (ref 8.6–10.3)
Chloride: 105 mmol/L (ref 98–110)
Creat: 0.69 mg/dL (ref 0.60–1.24)
Globulin: 3.4 g/dL (calc) (ref 1.9–3.7)
Glucose, Bld: 71 mg/dL (ref 65–99)
Potassium: 4.3 mmol/L (ref 3.5–5.3)
Sodium: 140 mmol/L (ref 135–146)
Total Bilirubin: 0.4 mg/dL (ref 0.2–1.2)
Total Protein: 7.6 g/dL (ref 6.1–8.1)
eGFR: 132 mL/min/{1.73_m2} (ref >=60)

## 2022-10-18 LAB — SEDIMENTATION RATE: Sed Rate: 17 mm/h — ABNORMAL HIGH (ref 0–15)

## 2022-10-18 LAB — C-REACTIVE PROTEIN: CRP: 31.3 mg/L — ABNORMAL HIGH (ref <8.0)

## 2022-10-18 MED ORDER — ARIPIPRAZOLE 5 MG PO TABS: ORAL_TABLET | ORAL | 0 refills | Status: DC

## 2022-10-18 MED ORDER — GABAPENTIN 100 MG PO CAPS: 100.0000 mg | ORAL_CAPSULE | Freq: Two times a day (BID) | ORAL | 0 refills | Status: DC

## 2022-10-18 NOTE — Telephone Encounter (Signed)
Patient called back regarding MRI. States that he will need something for anxiety before he can have MRI done. Is not able to sit still. Is using [facility]. Leatrice Jewels, RMA

## 2022-10-18 NOTE — Telephone Encounter (Signed)
Attempted to call patient regarding lab results. Will need to get patient scheduled for MRI before Tuesday next week.  Will send mychart message requesting call back. Leatrice Jewels, RMA

## 2022-10-18 NOTE — Telephone Encounter (Signed)
Pt's wbc elevated as well as ESR and CRP. MRI brain ordered which needs to be done and read before he leaves for trip.

## 2022-10-18 NOTE — Progress Notes (Signed)
[facility] Initial Assessment Note  Patient Location:In Car Provider Location:Home Office   I connected with Devin Barker by video and verified that I am talking with correct person using two identifiers.   I discussed the limitations, risks, security and privacy concerns of performing an evaluation and management service virtually and the availability of in person appointments. I also discussed with the patient that there may be a patient responsible charge related to this service. The patient expressed understanding and agreed to proceed.  Devin Barker [id number] 27 y.o.  10/18/2022 11:02 AM  Chief Complaint:  I need some help.  I am going through a lot.  I am not taking medication.  History of Present Illness:  Patient is 27 year old [place] American unmarried man who is self-referred for seeking help.  Patient appears very anxious, labile, having difficulty expressing his symptoms.  He appears very emotional about his symptoms.  Patient told he has a history of heavy drug use, ADHD and anxiety.  Patient told [place] after the war started there.  Patient told his parents took him in 2019 after they found out about his drugs and during parties.  [place] he was continued to do drugs but also started seeing psychiatrist.  He has at least 4 rehabilitation and the last one was in 2020 and some down.  Patient reported he was clean for a while until family decided to move back [place] 8 months ago and he relapsed into drugs.  He admitted 5 months ago had a crystal meth.  Patient also had injury on his head when he hit a metal pipe and later he got infected and required brain surgery.  Patient reported that he noticed more emotional and have struggle focusing since the surgery.  His primary care physician started the medication he was getting it from psychiatrist but it does not work.  He was given Luvox, Tegretol and  amitriptyline.  He stopped taking these medication.  He wants to stay away from the drugs and sometime he feel very confused if he is doing the right thing.  He admitted a lot of stressful things going on in his life.  He is currently not working and on disability since November.  He will go back to work in April.  He admitted easily emotional, paranoia, crying, anxious and feeling overwhelmed.  Also reported history of impulsive behavior and excessive spending.  He is in financial debt because of his spending habits.  He has highs and lows in his mood.  He does not feel very confident in his decision.  He admitted to make the decision very quick and later he regret.  Patient reported currently he is seeing a girl who is already engaged and he has a lot of guilt about it.  Patient not sure if he is doing the right thing.  He also reported family tension because parents do not like when he was using drugs.  Slowly and gradually he is trying to build a better relationship with the parents.  He is oldest in a field that he is supposed to be a better person but he is not up to his Loraine Leriche.  He is continued to engage in a social network who does drugs and he wants to get away from them.  He had decided to go to [place] next week because he wants to take some time out to relax.  He went to see his friends from  sit down who are going to meet him there.  He feels bad about so down because there is a war going on.  He feels sometimes hopeless, helpless, fatigue, tired.  He does not sleep very well and reported having racing thoughts.  Currently he is not in any therapy.  He admitted to continue to smoke delta 8 agent however has not done heavy drugs in the past 3 months.  Denies any active or passive suicidal thoughts or homicidal thoughts.  He denies any hallucination but reported paranoia that people are talking about him.  His appetite is okay.  His weight is unchanged from the past.  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  His PHQ 20 and GDS 7 is 19.    Past Psychiatric History: History of poor impulse control, highs and lows, paranoia, heavy drug use.  History of at least 4 rehab.  Diagnosis ADHD and given Adderall by a local psychiatrist which [place].  History of taking overdose in 2019.  He was in [place]treated with fluoxetine, amitriptyline and Tegretol overseas but stopped the medicine in [place] when it did not work.   Past Medical History:  Diagnosis Date   Knee dislocation    Obesity      Traumatic Head Injury: History of injury with a metal pipe later got infected and required brain surgery.  Work History; Patient working in a [employer] fast streamer.  He did finish high school with average grades.  He did few years at [employer] but did not finish education.  Psychosocial History; Patient born in [place] but grew up in [place] until 2019 moved back to [place] with the parents after they find out he was using drugs and doing parties.  He lived in [place] for 4 years and recently back to [place] 8 months ago.  He is [place] and his sibling.  He is oldest.  History Of Abuse; Patient did not reported any history of abuse.  Substance Abuse History; History of heavy drug use including IVDA, crystal meth, cocaine.  He has multiple rehab.  His last use was 4 months ago.  Neurologic: Headache: Yes Seizure: No Paresthesias: No   Outpatient Encounter Medications as of 10/18/2022  Medication Sig   carbamazepine (TEGRETOL XR) 400 MG 12 hr tablet Take 400 mg by mouth daily. (Patient not taking: Reported on 10/17/2022)   fluvoxaMINE (LUVOX) 100 MG tablet Take 1 tablet (100 mg total) by mouth in the morning. (Patient not taking: Reported on 10/17/2022)   linezolid (ZYVOX) 600 MG tablet Take 1 tablet (600 mg total) by mouth 2 (two) times daily.   pantoprazole (PROTONIX) 40 MG tablet Take 1 tablet (40 mg  total) by mouth daily. (Patient not taking: Reported on 09/21/2022)   No facility-administered encounter medications on file as of 10/18/2022.    Recent Results (from the past 2160 hour(s))  CBG monitoring, ED     Status: Abnormal   Collection Time: 07/30/22  9:24 PM  Result Value Ref Range   Glucose-Capillary 119 (H) 70 - 99 mg/dL    Comment: Glucose reference range applies only to samples taken after fasting for at least 8 hours.  Urinalysis, Routine w reflex microscopic     Status: Abnormal   Collection Time: 07/30/22 10:04 PM  Result Value Ref Range   Color, Urine AMBER (A) YELLOW    Comment: BIOCHEMICALS MAY BE AFFECTED BY COLOR  APPearance TURBID (A) CLEAR   Specific Gravity, Urine 1.021 1.005 - 1.030   pH 7.0 5.0 - 8.0   Glucose, UA NEGATIVE NEGATIVE mg/dL   Hgb urine dipstick NEGATIVE NEGATIVE   Bilirubin Urine NEGATIVE NEGATIVE   Ketones, ur 5 (A) NEGATIVE mg/dL   Protein, ur 235 (A) NEGATIVE mg/dL   Nitrite NEGATIVE NEGATIVE   Leukocytes,Ua NEGATIVE NEGATIVE   RBC / HPF 0-5 0 - 5 RBC/hpf   WBC, UA 0-5 0 - 5 WBC/hpf   Bacteria, UA FEW (A) NONE SEEN   Mucus PRESENT    Amorphous Crystal PRESENT    Sperm, UA PRESENT     Comment: Performed at [facility], [address] [place], [place] [address]  Rapid urine drug screen (hospital performed)     Status: Abnormal   Collection Time: 07/30/22 10:04 PM  Result Value Ref Range   Opiates POSITIVE (A) NONE DETECTED   Cocaine POSITIVE (A) NONE DETECTED   Benzodiazepines NONE DETECTED NONE DETECTED   Amphetamines POSITIVE (A) NONE DETECTED   Tetrahydrocannabinol POSITIVE (A) NONE DETECTED   Barbiturates NONE DETECTED NONE DETECTED    Comment: (NOTE) DRUG SCREEN FOR MEDICAL PURPOSES ONLY.  IF CONFIRMATION IS NEEDED FOR ANY PURPOSE, NOTIFY LAB WITHIN 5 DAYS.  LOWEST DETECTABLE LIMITS FOR URINE DRUG SCREEN Drug Class                     Cutoff (ng/mL) Amphetamine and metabolites    1000 Barbiturate and metabolites     200 Benzodiazepine                 200 Opiates and metabolites        300 Cocaine and metabolites        300 THC                            50 Performed at [facility], [address] [place], [place] [address]   Resp Panel by RT-PCR (Flu A&B, Covid) Anterior Nasal Swab     Status: None   Collection Time: 07/30/22 10:05 PM   Specimen: Anterior Nasal Swab  Result Value Ref Range   SARS Coronavirus 2 by RT PCR NEGATIVE NEGATIVE    Comment: (NOTE) SARS-CoV-2 target nucleic acids are NOT DETECTED.  The SARS-CoV-2 RNA is generally detectable in upper respiratory specimens during the acute phase of infection. The lowest concentration of SARS-CoV-2 viral copies this assay can detect is 138 copies/mL. A negative result does not preclude SARS-Cov-2 infection and should not be used as the sole basis for treatment or other patient management decisions. A negative result may occur with  improper specimen collection/handling, submission of specimen other than nasopharyngeal swab, presence of viral mutation(s) within the areas targeted by this assay, and inadequate number of viral copies(<138 copies/mL). A negative result must be combined with clinical observations, patient history, and epidemiological information. The expected result is Negative.  Fact Sheet for Patients:  BloggerCourse.com  Fact Sheet for Healthcare Providers:  SeriousBroker.it  This test is no t yet approved or cleared by the [place] FDA and  has been authorized for detection and/or diagnosis of SARS-CoV-2 by FDA under an Emergency Use Authorization (EUA). This EUA will remain  in effect (meaning this test can be used) for the duration of the COVID-19 declaration under Section 564(b)(1) of the Act, 21 U.S.C.section 360bbb-3(b)(1), unless the authorization is terminated  or revoked  sooner.       Influenza A by PCR NEGATIVE NEGATIVE   Influenza B by PCR  NEGATIVE NEGATIVE    Comment: (NOTE) The Xpert Xpress SARS-CoV-2/FLU/RSV plus assay is intended as an aid in the diagnosis of influenza from Nasopharyngeal swab specimens and should not be used as a sole basis for treatment. Nasal washings and aspirates are unacceptable for Xpert Xpress SARS-CoV-2/FLU/RSV testing.  Fact Sheet for Patients: BloggerCourse.com  Fact Sheet for Healthcare Providers: SeriousBroker.it  This test is not yet approved or cleared by the [place] FDA and has been authorized for detection and/or diagnosis of SARS-CoV-2 by FDA under an Emergency Use Authorization (EUA). This EUA will remain in effect (meaning this test can be used) for the duration of the COVID-19 declaration under Section 564(b)(1) of the Act, 21 U.S.C. section 360bbb-3(b)(1), unless the authorization is terminated or revoked.  Performed at [facility], [address] [place], [place] [address]   Lipase, blood     Status: None   Collection Time: 07/30/22 10:20 PM  Result Value Ref Range   Lipase 26 11 - 51 U/L    Comment: Performed at [facility], [address], [place], [place] [address]  Comprehensive metabolic panel     Status: Abnormal   Collection Time: 07/30/22 10:20 PM  Result Value Ref Range   Sodium 138 135 - 145 mmol/L   Potassium 4.3 3.5 - 5.1 mmol/L   Chloride 101 98 - 111 mmol/L   CO2 26 22 - 32 mmol/L   Glucose, Bld 111 (H) 70 - 99 mg/dL    Comment: Glucose reference range applies only to samples taken after fasting for at least 8 hours.   BUN 6 6 - 20 mg/dL   Creatinine, Ser 6.96 0.61 - 1.24 mg/dL   Calcium 9.5 8.9 - 29.5 mg/dL   Total Protein 8.0 6.5 - 8.1 g/dL   Albumin 3.8 3.5 - 5.0 g/dL   AST 18 15 - 41 U/L   ALT 22 0 - 44 U/L   Alkaline Phosphatase 64 38 - 126 U/L   Total Bilirubin 0.6 0.3 - 1.2 mg/dL   GFR, Estimated >28 >41 mL/min    Comment: (NOTE) Calculated using the CKD-EPI Creatinine  Equation (2021)    Anion gap 11 5 - 15    Comment: Performed at [facility], [address], [place], [place] [address]  CBC with Differential     Status: Abnormal   Collection Time: 07/31/22 12:20 AM  Result Value Ref Range   WBC 11.8 (H) 4.0 - 10.5 K/uL   RBC 5.84 (H) 4.22 - 5.81 MIL/uL   Hemoglobin 15.4 13.0 - 17.0 g/dL   HCT 10.2 72.5 - 36.6 %   MCV 84.1 80.0 - 100.0 fL   MCH 26.4 26.0 - 34.0 pg   MCHC 31.4 30.0 - 36.0 g/dL   RDW 44.0 34.7 - 42.5 %   Platelets 617 (H) 150 - 400 K/uL   nRBC 0.0 0.0 - 0.2 %   Neutrophils Relative % 66 %   Neutro Abs 7.8 (H) 1.7 - 7.7 K/uL   Lymphocytes Relative 24 %   Lymphs Abs 2.8 0.7 - 4.0 K/uL   Monocytes Relative 4 %   Monocytes Absolute 0.5 0.1 - 1.0 K/uL   Eosinophils Relative 4 %   Eosinophils Absolute 0.5 0.0 - 0.5 K/uL   Basophils Relative 1 %   Basophils Absolute 0.1 0.0 - 0.1 K/uL  Immature Granulocytes 1 %   Abs Immature Granulocytes 0.07 0.00 - 0.07 K/uL    Comment: Performed at [facility], [address] [place], [place] [address]  Blood culture (routine x 2)     Status: None   Collection Time: 07/31/22  2:30 AM   Specimen: BLOOD RIGHT HAND  Result Value Ref Range   Specimen Description BLOOD RIGHT HAND    Special Requests      BOTTLES DRAWN AEROBIC AND ANAEROBIC Blood Culture results may not be optimal due to an inadequate volume of blood received in culture bottles   Culture      NO GROWTH 5 DAYS Performed at [facility], [address] [place], [place] [address]    Report Status 08/05/2022 FINAL   Blood culture (routine x 2)     Status: None   Collection Time: 07/31/22  3:00 AM   Specimen: BLOOD  Result Value Ref Range   Specimen Description BLOOD RIGHT ANTECUBITAL    Special Requests      BOTTLES DRAWN AEROBIC AND ANAEROBIC Blood Culture adequate volume   Culture      NO GROWTH 5 DAYS Performed at [facility], [address] [place], [place] [address]    Report Status  08/05/2022 FINAL   HIV Antibody (routine testing w rflx)     Status: None   Collection Time: 07/31/22  4:30 AM  Result Value Ref Range   HIV Screen 4th Generation wRfx Non Reactive Non Reactive    Comment: Performed at [facility], [address] [place], [place] [address]  CBC     Status: Abnormal   Collection Time: 07/31/22  4:30 AM  Result Value Ref Range   WBC 13.5 (H) 4.0 - 10.5 K/uL   RBC 5.44 4.22 - 5.81 MIL/uL   Hemoglobin 14.5 13.0 - 17.0 g/dL   HCT 29.546.6 62.139.0 - 30.852.0 %   MCV 85.7 80.0 - 100.0 fL   MCH 26.7 26.0 - 34.0 pg   MCHC 31.1 30.0 - 36.0 g/dL   RDW 65.714.6 84.611.5 - 96.215.5 %   Platelets 555 (H) 150 - 400 K/uL   nRBC 0.0 0.0 - 0.2 %    Comment: Performed at [facility], [address] [place], [place] [address]  TSH     Status: None   Collection Time: 07/31/22  4:30 AM  Result Value Ref Range   TSH 2.682 0.350 - 4.500 uIU/mL    Comment: Performed by a 3rd Generation assay with a functional sensitivity of <=0.01 uIU/mL. Performed at [facility], [address] [place], [place] [address]   Urine cytology ancillary only     Status: None   Collection Time: 07/31/22  9:10 AM  Result Value Ref Range   Neisseria Gonorrhea Negative    Chlamydia Negative    Comment Normal Reference Ranger Chlamydia - Negative    Comment      Normal Reference Range Neisseria Gonorrhea - Negative  Miscellaneous [facility] test (send-out)     Status: None   Collection Time: 07/31/22 10:30 AM  Result Value Ref Range   [facility] test code [id number]    [facility] test name      PROPIONIBACTERIUM ACNES SUSCEPTIBILITY TISSUE CULTURE    Comment: Performed at [facility], [address] [place], [place] [address]   Misc [facility] result COMMENT     Comment: (NOTE) Test Ordered: [id number] Susceptibility, Anaerobic, MIC Susceptibility Results  See below:  Naevius.Petit[A ]          ML   SOURCE: TRACHEA, TISSUE/INTRACRANIAL SUSCEPTIBILITY, ANAEROBIC, MIC                         FINAL   CUTIBACTERIUM ACNES Organism identified by client. There are no established interpretive guidelines for agents reported without interpretations.  ----------------------------------------------------------  Organism     CUTIBACTERIUM ACNES  Antibiotic  MIC (mcg/mL)  Interpretation  ----------------------------------------------------------  Penicillin         <=0.5       S  Moxifloxacin         <=2       S  Minocycline          <=4 ------------------------------------------------------------  S=SUSCEPTIBLE  I=INTERMEDIATE  R=RESISTANT  NS=NONSUSCEPTIBLE  SDD=SUSCEPTIBLE DOSE DEPENDENT ------------------------------------------------------------ Performed At: [facility] [place] [address] [place], [place] [id number] Jolene SchimkeNagendra Sanjai MD WU:[id number]:[phone] Performed At: [facility] [place] [address] [place], [place] [id number] Jacelyn PiWilliam G YQ:[id number]:[phone]   Aerobic/Anaerobic Culture w Gram Stain (surgical/deep wound)     Status: None   Collection Time: 07/31/22  8:05 PM   Specimen: Soft Tissue, Other  Result Value Ref Range   Specimen Description WOUND    Special Requests SWABS OF RIGHT CORONAL SUTURE MASS    Gram Stain      RARE WBC PRESENT, PREDOMINANTLY MONONUCLEAR NO ORGANISMS SEEN    Culture      RARE PROPIONIBACTERIUM ACNES SUSCEPTIBILITIES PERFORMED BY [facility] [place] SEE SEPARATE REPORT Performed at [facility], [address] [place], [place] [address]    Report Status 08/25/2022 FINAL   Surgical pathology     Status: None   Collection Time: 07/31/22  8:11 PM  Result Value Ref Range   SURGICAL PATHOLOGY      S   Aerobic/Anaerobic Culture w Gram Stain (surgical/deep wound)     Status: None   Collection Time: 07/31/22  8:16 PM   Specimen: Soft Tissue, Other  Result Value Ref Range   Specimen Description TISSUE    Special Requests RT CORONAL SUTURE MASS    Gram Stain NO WBC SEEN NO ORGANISMS SEEN     Culture      RARE PROPIONIBACTERIUM  ACNES Standardized susceptibility testing for this organism is not available. Performed at [facility], [address] [place], [place] [address]    Report Status 08/05/2022 FINAL   MRSA Next Gen by PCR, Nasal     Status: None   Collection Time: 08/01/22  2:15 AM   Specimen: Nasal Mucosa; Nasal Swab  Result Value Ref Range            RPR     Status: None   Collection Time: 08/01/22  5:30 AM  Result Value Ref Range   RPR Ser Ql NON REACTIVE NON REACTIVE    Comment: Performed at [facility], [address] [place], [place] [address]  Acid Fast Smear (AFB)     Status: None   Collection Time: 08/01/22  9:01 AM   Specimen: Tissue  Result Value Ref Range   AFB Specimen Processing Concentration    Acid Fast Smear Negative     Comment: (NOTE) Performed At: [facility] [place] [address] [place], [place] [id number] Jolene SchimkeNagendra Sanjai MD VQ:[id number]:[phone]    Source (AFB) TISSUE     Comment: Performed at [facility], [address]  [place], [place] [address]  Acid Fast Culture with reflexed sensitivities     Status: None   Collection Time: 08/01/22  9:01 AM   Specimen: Tissue  Result Value Ref Range   Acid Fast Culture Negative     Comment: (NOTE) No acid fast bacilli isolated after 6 weeks. Performed At: [facility] [address] [place], [place] [id number] Jolene Schimke MD MO:[id number]    Source of Sample TISSUE     Comment: Performed at [facility], [address], [place], [place] [address]  Acid Fast Smear (AFB)     Status: None   Collection Time: 08/01/22  9:16 AM   Specimen: Tissue  Result Value Ref Range   AFB Specimen Processing Concentration    Acid Fast Smear Negative     Comment: (NOTE) Performed At: [facility] [address] [place], [place] [id number] Jolene Schimke MD YF:[id number]    Source (AFB) TISSUE     Comment: Performed at [facility], [address], [place], [place] [address]  Acid Fast  Culture with reflexed sensitivities     Status: None   Collection Time: 08/01/22  9:16 AM   Specimen: Tissue  Result Value Ref Range   Acid Fast Culture Negative     Comment: (NOTE) No acid fast bacilli isolated after 6 weeks. Performed At: [facility] [address] [place], [place] [id number] Jolene Schimke MD VX:[id number]    Source of Sample TISSUE     Comment: Performed at [facility], [address] [place], [place] [address]  Culture, fungus without smear     Status: None   Collection Time: 08/01/22  9:16 AM   Specimen: Tissue; Other  Result Value Ref Range   Specimen Description TISSUE    Special Requests NONE    Culture      NO FUNGUS ISOLATED Performed at [facility], [address] [place], [place] [address]    Report Status 08/22/2022 FINAL   Culture, fungus without smear     Status: None   Collection Time: 08/01/22  9:30 AM   Specimen: Tissue  Result Value Ref Range   Specimen Description TISSUE    Special Requests NONE    Culture      NO FUNGUS ISOLATED Performed at [facility], [address], [place], [place] [address]    Report Status 08/22/2022 FINAL   Vancomycin, trough     Status: Abnormal   Collection Time: 08/02/22  8:53 AM  Result Value Ref Range   Vancomycin Tr 35 (HH) 15 - 20 ug/mL    Comment: CRITICAL RESULT CALLED TO, READ BACK BY AND VERIFIED WITH J.HARRLED RN 1005 08/02/22 MCCORMICK K Performed at [facility], [address], [place], [place] [address]   CBC     Status: Abnormal   Collection Time: 08/02/22  8:53 AM  Result Value Ref Range   WBC 13.9 (H) 4.0 - 10.5 K/uL   RBC 5.07 4.22 - 5.81 MIL/uL   Hemoglobin 13.4 13.0 - 17.0 g/dL   HCT 89.3 73.4 - 28.7 %   MCV 82.2 80.0 - 100.0 fL   MCH 26.4 26.0 - 34.0 pg   MCHC 32.1 30.0 - 36.0 g/dL   RDW 68.1 15.7 - 26.2 %   Platelets 511 (H) 150 - 400 K/uL   nRBC 0.0 0.0 - 0.2 %    Comment: Performed at [facility], [address]  [place],  [place] [address]  Basic metabolic panel     Status: Abnormal   Collection Time: 08/02/22  8:53 AM  Result Value Ref Range   Sodium 136 135 - 145 mmol/L   Potassium 3.5 3.5 - 5.1 mmol/L   Chloride 107 98 - 111 mmol/L   CO2 22 22 - 32 mmol/L   Glucose, Bld 138 (H) 70 - 99 mg/dL    Comment: Glucose reference range applies only to samples taken after fasting for at least 8 hours.   BUN 6 6 - 20 mg/dL   Creatinine, Ser 0.61 0.61 - 1.24 mg/dL   Calcium 8.3 (L) 8.9 - 10.3 mg/dL   GFR, Estimated >60 >60 mL/min    Comment: (NOTE) Calculated using the CKD-EPI Creatinine Equation (2021)    Anion gap 7 5 - 15    Comment: Performed at [facility] [address], [place], [place] [address]  Vancomycin, trough     Status: Abnormal   Collection Time: 08/02/22  2:01 PM  Result Value Ref Range   Vancomycin Tr 10 (L) 15 - 20 ug/mL    Comment: Performed at [facility], [place] [address], [place], [place] [address]  CBC     Status: Abnormal   Collection Time: 08/03/22  6:05 AM  Result Value Ref Range   WBC 12.4 (H) 4.0 - 10.5 K/uL   RBC 5.18 4.22 - 5.81 MIL/uL   Hemoglobin 13.7 13.0 - 17.0 g/dL   HCT 43.5 39.0 - 52.0 %   MCV 84.0 80.0 - 100.0 fL   MCH 26.4 26.0 - 34.0 pg   MCHC 31.5 30.0 - 36.0 g/dL   RDW 14.5 11.5 - 15.5 %   Platelets 521 (H) 150 - 400 K/uL   nRBC 0.0 0.0 - 0.2 %    Comment: Performed at [facility], [place] [address] [place], [place] [address]  Basic metabolic panel     Status: Abnormal   Collection Time: 08/03/22  6:05 AM  Result Value Ref Range   Sodium 134 (L) 135 - 145 mmol/L   Potassium 3.9 3.5 - 5.1 mmol/L   Chloride 104 98 - 111 mmol/L   CO2 25 22 - 32 mmol/L   Glucose, Bld 91 70 - 99 mg/dL    Comment: Glucose reference range applies only to samples taken after fasting for at least 8 hours.   BUN <5 (L) 6 - 20 mg/dL   Creatinine, Ser 0.56 (L) 0.61 - 1.24 mg/dL   Calcium 8.3 (L) 8.9 - 10.3 mg/dL   GFR, Estimated >60 >60 mL/min    Comment:  (NOTE) Calculated using the CKD-EPI Creatinine Equation (2021)    Anion gap 5 5 - 15    Comment: Performed at [facility] [address], [place], [place] [address]  Aspergillus Ag, BAL/Serum     Status: None   Collection Time: 08/03/22  8:41 AM   Specimen: Vein; Blood  Result Value Ref Range   Aspergillus Ag, BAL/Serum 0.05 0.00 - 0.49 Index    Comment: (NOTE) Performed At: [facility] [address] [place], [place] [id number] Rush Farmer MD OE:[id number]   Fungitell, Serum (as Misc Send Out)     Status: None   Collection Time: 08/03/22  8:42 AM  Result Value Ref Range   [facility] test code [id number]    [facility] test name [place] ([facility]) 1ML SERUM     Comment: Performed at [facility], [place] [address] [place], [place] [address]   Misc [facility] result COMMENT     Comment: (NOTE) Test Ordered: [phone] Fungitell Beta-D-Glucan Result                         Negative                  AMENY   Fungitell Value                <31.25           pg/mL    AMENY   Reference Value                Comment                   AMENY   Negative: <60, Positive: >/=60 Interpretation                 Notes                     AMENY   (1,3) Beta-D-Glucan was NOT DETECTED in the sample. Reasons for negative results could include the patient being in the early stage of infection before detectable levels of (1,3) Beta-D-Glycan are present. Clinical diagnosis should be made in the context of the patient's complete medical history. Clinical Relevance             Notes                     AMENY   The Fungitell test is indicated for presumptive diagnosis of fungal infection and should be used in conjunction with other diagnostic procedures. The test detects glucan from the following pathogens: Candida spp., Acremonium, Aspergillus spp., Coccidioides immitis, Fusarium  spp., Histoplasma capsulatum, Trichosporon spp., Sporothrix schenckii, Saccharomyces  cerevisiae, and Pneumocystis jiroveci./line This test does not detect certain fungal species such as Cryptococcus, which produce very low levels of (1,3)-beta-D-glucan. This test will not detect the zygomycetes, such as Absidia, Blastomyces, Mucor, and Rhizopus, which are not known to produce (1,3)-beta-D-glucan. In addition, the yeast phase of Blastomyces dermatitidis produces little (1,3)-beta-D-glucan and may not be detected by the assay. Disclaimer                     Notes                     AMENY   This test was developed and its performance characteristics determined by [facility]. It has not been cleared or approved by the [place] Food and Drug Administration. This laboratory is certified under the Clinical Laboratory Improvement Amendments (CLIA) and licensed by the [place] Department of Health as qualified to perform high complexity clinical laborat ory testing. Electronically Signed By       Orvil Feil, PhD Performed At: [facility] [place], [place] [id number] Rush Farmer MD [id number] Performed At: [facility] [address] [place] [id number] Royden Purl PhD [id number]   Vancomycin, trough     Status: Abnormal   Collection Time: 08/03/22  6:03 PM  Result Value Ref Range   Vancomycin Tr 14 (L) 15 - 20 ug/mL    Comment: Performed at [facility],  [address], [place], [place] [address]  CBC     Status: Abnormal   Collection Time: 08/05/22  4:40 AM  Result Value Ref Range   WBC 12.9 (H) 4.0 - 10.5 K/uL   RBC 5.25 4.22 - 5.81 MIL/uL   Hemoglobin 14.0 13.0 - 17.0 g/dL   HCT 60.4 54.0 - 98.1 %   MCV 82.7 80.0 - 100.0 fL   MCH 26.7 26.0 - 34.0 pg   MCHC 32.3 30.0 - 36.0 g/dL   RDW 19.1 47.8 - 29.5 %   Platelets 547 (H) 150 - 400 K/uL   nRBC 0.0 0.0 - 0.2 %    Comment: Performed at [facility], [address] [place], [place] [address]  Basic metabolic panel     Status: Abnormal   Collection Time: 08/05/22  4:40 AM  Result Value Ref Range   Sodium 138 135 - 145 mmol/L   Potassium 3.9 3.5 - 5.1 mmol/L   Chloride 106 98 - 111 mmol/L   CO2 25 22 - 32 mmol/L   Glucose, Bld 101 (H) 70 - 99 mg/dL    Comment: Glucose reference range applies only to samples taken after fasting for at least 8 hours.   BUN <5 (L) 6 - 20 mg/dL   Creatinine, Ser 8.65 0.61 - 1.24 mg/dL   Calcium 8.6 (L) 8.9 - 10.3 mg/dL   GFR, Estimated >78 >46 mL/min    Comment: (NOTE) Calculated using the CKD-EPI Creatinine Equation (2021)    Anion gap 7 5 - 15    Comment: Performed at [facility], [address] [place], [place] [address]  CBC     Status: Abnormal   Collection Time: 08/07/22  7:11 AM  Result Value Ref Range   WBC 12.2 (H) 4.0 - 10.5 K/uL   RBC 5.26 4.22 - 5.81 MIL/uL   Hemoglobin 14.3 13.0 - 17.0 g/dL   HCT 28.4 13.2 - 44.0 %   MCV 82.3 80.0 - 100.0 fL   MCH 27.2 26.0 - 34.0 pg   MCHC 33.0 30.0 - 36.0 g/dL   RDW 10.2 72.5 - 36.6 %   Platelets 599 (H) 150 - 400 K/uL   nRBC 0.0 0.0 - 0.2 %    Comment: Performed at [facility], [address] [place], [place] [address]  Basic metabolic panel     Status: Abnormal   Collection Time: 08/07/22  7:11 AM  Result Value Ref Range   Sodium 138 135 - 145 mmol/L   Potassium 4.0 3.5 - 5.1 mmol/L   Chloride 106 98 - 111 mmol/L   CO2 25 22 - 32 mmol/L   Glucose, Bld 99 70 - 99 mg/dL    Comment: Glucose reference range applies only to samples taken after fasting for at least 8 hours.   BUN 6 6 - 20 mg/dL   Creatinine, Ser 7.42 (L) 0.61 - 1.24 mg/dL   Calcium 9.0 8.9 - 59.5 mg/dL   GFR, Estimated >63 >87 mL/min    Comment: (NOTE) Calculated using the CKD-EPI Creatinine Equation (2021)    Anion gap 7 5 - 15    Comment: Performed at [facility], [address], [place], [place] [address]  Rapid urine drug screen (hospital performed)     Status:  Abnormal   Collection Time: 08/07/22  9:12 AM  Result Value Ref Range   Opiates POSITIVE (A) NONE DETECTED   Cocaine NONE DETECTED NONE DETECTED  Benzodiazepines NONE DETECTED NONE DETECTED   Amphetamines NONE DETECTED NONE DETECTED   Tetrahydrocannabinol NONE DETECTED NONE DETECTED   Barbiturates NONE DETECTED NONE DETECTED    Comment: (NOTE) DRUG SCREEN FOR MEDICAL PURPOSES ONLY.  IF CONFIRMATION IS NEEDED FOR ANY PURPOSE, NOTIFY LAB WITHIN 5 DAYS.  LOWEST DETECTABLE LIMITS FOR URINE DRUG SCREEN Drug Class                     Cutoff (ng/mL) Amphetamine and metabolites    1000 Barbiturate and metabolites    200 Benzodiazepine                 200 Opiates and metabolites        300 Cocaine and metabolites        300 THC                            50 Performed at [facility], [place] [address] [place], [place] [address]   CBC     Status: Abnormal   Collection Time: 08/09/22  7:29 AM  Result Value Ref Range   WBC 13.1 (H) 4.0 - 10.5 K/uL   RBC 5.04 4.22 - 5.81 MIL/uL   Hemoglobin 13.2 13.0 - 17.0 g/dL   HCT 42.1 39.0 - 52.0 %   MCV 83.5 80.0 - 100.0 fL   MCH 26.2 26.0 - 34.0 pg   MCHC 31.4 30.0 - 36.0 g/dL   RDW 14.4 11.5 - 15.5 %   Platelets 559 (H) 150 - 400 K/uL   nRBC 0.0 0.0 - 0.2 %    Comment: Performed at [facility], [place] [address] [place], [place] [address]  Basic metabolic panel     Status: Abnormal   Collection Time: 08/09/22  7:29 AM  Result Value Ref Range   Sodium 139 135 - 145 mmol/L   Potassium 4.0 3.5 - 5.1 mmol/L   Chloride 106 98 - 111 mmol/L   CO2 24 22 - 32 mmol/L   Glucose, Bld 134 (H) 70 - 99 mg/dL    Comment: Glucose reference range applies only to samples taken after fasting for at least 8 hours.   BUN 5 (L) 6 - 20 mg/dL   Creatinine, Ser 0.53 (L) 0.61 - 1.24 mg/dL   Calcium 8.5 (L) 8.9 - 10.3 mg/dL   GFR, Estimated >60 >60 mL/min    Comment: (NOTE) Calculated using the CKD-EPI Creatinine Equation (2021)    Anion gap 9 5 -  15    Comment: Performed at [facility] [address] [place], [place] [address]  CBC     Status: Abnormal   Collection Time: 08/11/22  5:00 AM  Result Value Ref Range   WBC 15.8 (H) 4.0 - 10.5 K/uL   RBC 5.12 4.22 - 5.81 MIL/uL   Hemoglobin 13.8 13.0 - 17.0 g/dL   HCT 42.1 39.0 - 52.0 %   MCV 82.2 80.0 - 100.0 fL   MCH 27.0 26.0 - 34.0 pg   MCHC 32.8 30.0 - 36.0 g/dL   RDW 14.2 11.5 - 15.5 %   Platelets 558 (H) 150 - 400 K/uL   nRBC 0.0 0.0 - 0.2 %    Comment: Performed at [facility], [place] [address], [place], [place] [address]  Basic metabolic panel     Status: Abnormal   Collection Time: 08/11/22  5:00 AM  Result Value Ref Range  Sodium 138 135 - 145 mmol/L   Potassium 4.0 3.5 - 5.1 mmol/L   Chloride 105 98 - 111 mmol/L   CO2 24 22 - 32 mmol/L   Glucose, Bld 111 (H) 70 - 99 mg/dL    Comment: Glucose reference range applies only to samples taken after fasting for at least 8 hours.   BUN 6 6 - 20 mg/dL   Creatinine, Ser 1.61 0.61 - 1.24 mg/dL   Calcium 8.4 (L) 8.9 - 10.3 mg/dL   GFR, Estimated >09 >60 mL/min    Comment: (NOTE) Calculated using the CKD-EPI Creatinine Equation (2021)    Anion gap 9 5 - 15    Comment: Performed at [facility], [address] [place], [place] [address]  Magnesium     Status: None   Collection Time: 08/11/22  5:00 AM  Result Value Ref Range   Magnesium 2.0 1.7 - 2.4 mg/dL    Comment: Performed at [facility], [address], [place], [place] [address]  Basic metabolic panel     Status: Abnormal   Collection Time: 08/13/22  6:24 AM  Result Value Ref Range   Sodium 137 135 - 145 mmol/L   Potassium 3.9 3.5 - 5.1 mmol/L   Chloride 103 98 - 111 mmol/L   CO2 26 22 - 32 mmol/L   Glucose, Bld 97 70 - 99 mg/dL    Comment: Glucose reference range applies only to samples taken after fasting for at least 8 hours.   BUN 5 (L) 6 - 20 mg/dL   Creatinine, Ser 4.78 0.61 - 1.24 mg/dL   Calcium 8.8 (L) 8.9 - 10.3 mg/dL    GFR, Estimated >29 >56 mL/min    Comment: (NOTE) Calculated using the CKD-EPI Creatinine Equation (2021)    Anion gap 8 5 - 15    Comment: Performed at [facility], [address] [place], [place] [address]  Magnesium     Status: None   Collection Time: 08/13/22  6:24 AM  Result Value Ref Range   Magnesium 2.0 1.7 - 2.4 mg/dL    Comment: Performed at [facility], [address] [place], [place] [address]  CBC     Status: Abnormal   Collection Time: 08/16/22  6:15 AM  Result Value Ref Range   WBC 12.8 (H) 4.0 - 10.5 K/uL   RBC 5.00 4.22 - 5.81 MIL/uL   Hemoglobin 13.3 13.0 - 17.0 g/dL   HCT 69.6 29.5 - 28.4 %   MCV 82.2 80.0 - 100.0 fL   MCH 26.6 26.0 - 34.0 pg   MCHC 32.4 30.0 - 36.0 g/dL   RDW 13.2 44.0 - 10.2 %   Platelets 435 (H) 150 - 400 K/uL   nRBC 0.0 0.0 - 0.2 %    Comment: Performed at [facility], [address] [place], [place] [address]  Basic metabolic panel     Status: Abnormal   Collection Time: 08/16/22  9:52 AM  Result Value Ref Range   Sodium 138 135 - 145 mmol/L   Potassium 4.1 3.5 - 5.1 mmol/L   Chloride 102 98 - 111 mmol/L   CO2 25 22 - 32 mmol/L   Glucose, Bld 161 (H) 70 - 99 mg/dL    Comment: Glucose reference range applies only to samples taken after fasting for at least 8 hours.   BUN 10 6 - 20 mg/dL   Creatinine, Ser 6.44 0.61 - 1.24 mg/dL  Calcium 8.8 (L) 8.9 - 10.3 mg/dL   GFR, Estimated >29>60 >52>60 mL/min    Comment: (NOTE) Calculated using the CKD-EPI Creatinine Equation (2021)    Anion gap 11 5 - 15    Comment: Performed at [facility], [address] [place], [place] [address]  Magnesium     Status: None   Collection Time: 08/16/22  9:52 AM  Result Value Ref Range   Magnesium 1.9 1.7 - 2.4 mg/dL    Comment: Performed at [facility], [address] [place], [place] [address]  C-reactive protein     Status: Abnormal   Collection Time: 08/19/22  7:40 AM  Result Value Ref Range   CRP 2.2 (H) <1.0 mg/dL     Comment: Performed at [facility], [address] [place], [place] [address]  CBC with Differential/Platelet     Status: Abnormal   Collection Time: 08/19/22  7:40 AM  Result Value Ref Range   WBC 12.0 (H) 4.0 - 10.5 K/uL   RBC 5.35 4.22 - 5.81 MIL/uL   Hemoglobin 14.3 13.0 - 17.0 g/dL   HCT 66.443.1 40.339.0 - 47.452.0 %   MCV 80.6 80.0 - 100.0 fL   MCH 26.7 26.0 - 34.0 pg   MCHC 33.2 30.0 - 36.0 g/dL   RDW 25.914.3 56.311.5 - 87.515.5 %   Platelets 419 (H) 150 - 400 K/uL   nRBC 0.0 0.0 - 0.2 %   Neutrophils Relative % 33 %   Neutro Abs 4.0 1.7 - 7.7 K/uL   Lymphocytes Relative 38 %   Lymphs Abs 4.6 (H) 0.7 - 4.0 K/uL   Monocytes Relative 7 %   Monocytes Absolute 0.8 0.1 - 1.0 K/uL   Eosinophils Relative 21 %   Eosinophils Absolute 2.5 (H) 0.0 - 0.5 K/uL   Basophils Relative 1 %   Basophils Absolute 0.1 0.0 - 0.1 K/uL   nRBC 0 0 /100 WBC   Abs Immature Granulocytes 0.00 0.00 - 0.07 K/uL    Comment: Performed at [facility], [address] [place], [place] [address]  Comprehensive metabolic panel     Status: Abnormal   Collection Time: 08/19/22  7:40 AM  Result Value Ref Range   Sodium 138 135 - 145 mmol/L   Potassium 4.0 3.5 - 5.1 mmol/L   Chloride 104 98 - 111 mmol/L   CO2 26 22 - 32 mmol/L   Glucose, Bld 91 70 - 99 mg/dL    Comment: Glucose reference range applies only to samples taken after fasting for at least 8 hours.   BUN 6 6 - 20 mg/dL   Creatinine, Ser 9.510.60 (L) 0.61 - 1.24 mg/dL   Calcium 8.7 (L) 8.9 - 10.3 mg/dL   Total Protein 6.9 6.5 - 8.1 g/dL   Albumin 3.1 (L) 3.5 - 5.0 g/dL   AST 24 15 - 41 U/L   ALT 28 0 - 44 U/L   Alkaline Phosphatase 51 38 - 126 U/L   Total Bilirubin 0.4 0.3 - 1.2 mg/dL   GFR, Estimated >88>60 >41>60 mL/min    Comment: (NOTE) Calculated using the CKD-EPI Creatinine Equation (2021)    Anion gap 8 5 - 15    Comment: Performed at [facility], [address] [place], [place] [address]  Pathologist smear review     Status: None    Collection Time: 08/19/22  7:40 AM  Result Value Ref Range   Path Review Eosinophilia, basophilia,  Comment: mild lymphocytosis and thrombocytosis. No blasts identified. Reviewed by Jesse Fall. Unknown Foley, M.D. 08/21/2022 Performed at [facility], [address] [place], [place] [address]   Hepatitis B surface antibody,quantitative     Status: Abnormal   Collection Time: 08/21/22  3:13 PM  Result Value Ref Range   Hep B S AB Quant (Post) 3.2 (L) Immunity>9.9 mIU/mL    Comment: (NOTE)  Status of Immunity                     Anti-HBs Level  ------------------                     -------------- Inconsistent with Immunity                   0.0 - 9.9 Consistent with Immunity                          >9.9 Performed At: [facility] [address] [place], [place] [id number] Jolene Schimke MD [id number]   HCV Ab w Reflex to Quant PCR     Status: None   Collection Time: 08/21/22  3:13 PM  Result Value Ref Range   HCV Ab Non Reactive Non Reactive    Comment: (NOTE) Performed At: [facility] [address] [place], [place] [id number] Jolene Schimke MD [id number]   Hepatitis A antibody, total     Status: Abnormal   Collection Time: 08/21/22  3:13 PM  Result Value Ref Range   hep A Total Ab Reactive (A) NON REACTIVE    Comment: Performed at [facility], [address], [place], [place] [address]  Sedimentation rate     Status: Abnormal   Collection Time: 08/21/22  3:13 PM  Result Value Ref Range   Sed Rate 31 (H) 0 - 16 mm/hr    Comment: Performed at [facility], [address], [place], [place] [address]  C-reactive protein     Status: Abnormal   Collection Time: 08/21/22  3:13 PM  Result Value Ref Range   CRP 3.0 (H) <1.0 mg/dL    Comment: Performed at [facility], [address], [place], [place] [address]  Interpretation:     Status: None   Collection Time: 08/21/22  3:13 PM  Result Value Ref Range   HCV Interp 1: Comment      Comment: (NOTE) Not infected with HCV unless early or acute infection is suspected (which may be delayed in an immunocompromised individual), or other evidence exists to indicate HCV infection. Performed At: [facility] [address] [place], [place] [id number] Jolene Schimke MD [id number]   Hepatitis B surface antigen     Status: None   Collection Time: 08/21/22  3:16 PM  Result Value Ref Range   Hepatitis B Surface Ag NON REACTIVE NON REACTIVE    Comment: Performed at [facility], [address], [place], [place] [address]  CBC with Differential/Platelet     Status: Abnormal   Collection Time: 09/21/22 10:45 AM  Result Value Ref Range   WBC 12.3 (H) 3.8 - 10.8 Thousand/uL   RBC 5.15 4.20 - 5.80 Million/uL   Hemoglobin 13.8 13.2 - 17.1 g/dL   HCT 06.3 01.6 - 01.0 %   MCV 81.4 80.0 - 100.0 fL   MCH 26.8 (L) 27.0 - 33.0 pg   MCHC 32.9 32.0 - 36.0 g/dL  RDW 14.6 11.0 - 15.0 %   Platelets 353 140 - 400 Thousand/uL   MPV 9.5 7.5 - 12.5 fL   Neutro Abs 3,383 1,500 - 7,800 cells/uL   Lymphs Abs 3,973 (H) 850 - 3,900 cells/uL   Absolute Monocytes 467 200 - 950 cells/uL   Eosinophils Absolute 4,317 (H) 15 - 500 cells/uL   Basophils Absolute 160 0 - 200 cells/uL   Neutrophils Relative % 27.5 %   Total Lymphocyte 32.3 %   Monocytes Relative 3.8 %   Eosinophils Relative 35.1 %   Basophils Relative 1.3 %   Smear Review      Comment: Review of peripheral smear confirms automated results.   Comprehensive metabolic panel     Status: Abnormal   Collection Time: 09/21/22 10:45 AM  Result Value Ref Range   Glucose, Bld 119 (H) 65 - 99 mg/dL    Comment: .            Fasting reference interval . For someone without known diabetes, a glucose value between 100 and 125 mg/dL is consistent with prediabetes and should be confirmed with a follow-up test. .    BUN 10 7 - 25 mg/dL   Creat 1.61 0.96 - 0.45 mg/dL   BUN/Creatinine Ratio SEE NOTE: 6 - 22 (calc)     Comment:    Not Reported: BUN and Creatinine are within    reference range. .    Sodium 139 135 - 146 mmol/L   Potassium 4.1 3.5 - 5.3 mmol/L   Chloride 107 98 - 110 mmol/L   CO2 22 20 - 32 mmol/L   Calcium 8.6 8.6 - 10.3 mg/dL   Total Protein 6.7 6.1 - 8.1 g/dL   Albumin 3.9 3.6 - 5.1 g/dL   Globulin 2.8 1.9 - 3.7 g/dL (calc)   AG Ratio 1.4 1.0 - 2.5 (calc)   Total Bilirubin 0.2 0.2 - 1.2 mg/dL   Alkaline phosphatase (APISO) 82 36 - 130 U/L   AST 11 10 - 40 U/L   ALT 14 9 - 46 U/L  Sed Rate (ESR)     Status: None   Collection Time: 09/21/22 10:45 AM  Result Value Ref Range   Sed Rate 9 0 - 15 mm/h  CRP (C-Reactive Protein)     Status: Abnormal   Collection Time: 09/21/22 10:45 AM  Result Value Ref Range   CRP 26.8 (H) <8.0 mg/L  CBC w/Diff     Status: Abnormal   Collection Time: 10/03/22  3:40 AM  Result Value Ref Range   WBC 15.3 (H) 3.8 - 10.8 Thousand/uL   RBC 5.54 4.20 - 5.80 Million/uL   Hemoglobin 14.5 13.2 - 17.1 g/dL   HCT 40.9 81.1 - 91.4 %   MCV 81.0 80.0 - 100.0 fL   MCH 26.2 (L) 27.0 - 33.0 pg   MCHC 32.3 32.0 - 36.0 g/dL   RDW 78.2 95.6 - 21.3 %   Platelets 588 (H) 140 - 400 Thousand/uL   MPV 9.7 7.5 - 12.5 fL   Neutro Abs 4,927 1,500 - 7,800 cells/uL   Lymphs Abs 5,722 (H) 850 - 3,900 cells/uL   Absolute Monocytes 857 200 - 950 cells/uL   Eosinophils Absolute 3,657 (H) 15 - 500 cells/uL   Basophils Absolute 138 0 - 200 cells/uL   Neutrophils Relative % 32.2 %   Total Lymphocyte 37.4 %   Monocytes Relative 5.6 %   Eosinophils Relative 23.9 %   Basophils Relative 0.9 %  Sedimentation  rate     Status: None   Collection Time: 10/03/22  3:40 AM  Result Value Ref Range   Sed Rate 14 0 - 15 mm/h  Comp Met (CMET)     Status: None   Collection Time: 10/03/22  3:40 AM  Result Value Ref Range   Glucose, Bld 69 65 - 99 mg/dL    Comment: .            Fasting reference interval .    BUN 8 7 - 25 mg/dL   Creat 1.61 0.96 - 0.45 mg/dL   BUN/Creatinine Ratio  SEE NOTE: 6 - 22 (calc)    Comment:    Not Reported: BUN and Creatinine are within    reference range. .    Sodium 137 135 - 146 mmol/L   Potassium 3.9 3.5 - 5.3 mmol/L   Chloride 103 98 - 110 mmol/L   CO2 23 20 - 32 mmol/L   Calcium 9.1 8.6 - 10.3 mg/dL   Total Protein 7.6 6.1 - 8.1 g/dL   Albumin 4.2 3.6 - 5.1 g/dL   Globulin 3.4 1.9 - 3.7 g/dL (calc)   AG Ratio 1.2 1.0 - 2.5 (calc)   Total Bilirubin 0.4 0.2 - 1.2 mg/dL   Alkaline phosphatase (APISO) 85 36 - 130 U/L   AST 11 10 - 40 U/L   ALT 14 9 - 46 U/L  C-reactive protein     Status: Abnormal   Collection Time: 10/03/22  3:40 AM  Result Value Ref Range   CRP 14.2 (H) <8.0 mg/L  CBC with Differential/Platelet     Status: Abnormal   Collection Time: 10/17/22 11:33 AM  Result Value Ref Range   WBC 13.2 (H) 3.8 - 10.8 Thousand/uL   RBC 5.67 4.20 - 5.80 Million/uL   Hemoglobin 14.9 13.2 - 17.1 g/dL   HCT 40.9 81.1 - 91.4 %   MCV 81.1 80.0 - 100.0 fL   MCH 26.3 (L) 27.0 - 33.0 pg   MCHC 32.4 32.0 - 36.0 g/dL   RDW 78.2 95.6 - 21.3 %   Platelets 569 (H) 140 - 400 Thousand/uL   MPV 9.4 7.5 - 12.5 fL   Neutro Abs 5,108 1,500 - 7,800 cells/uL   Lymphs Abs 4,066 (H) 850 - 3,900 cells/uL   Absolute Monocytes 554 200 - 950 cells/uL   Eosinophils Absolute 3,326 (H) 15 - 500 cells/uL   Basophils Absolute 145 0 - 200 cells/uL   Neutrophils Relative % 38.7 %   Total Lymphocyte 30.8 %   Monocytes Relative 4.2 %   Eosinophils Relative 25.2 %   Basophils Relative 1.1 %  COMPLETE METABOLIC PANEL WITH GFR     Status: None   Collection Time: 10/17/22 11:33 AM  Result Value Ref Range   Glucose, Bld 71 65 - 99 mg/dL    Comment: .            Fasting reference interval .    BUN 8 7 - 25 mg/dL   Creat 0.86 5.78 - 4.69 mg/dL   eGFR 629 > OR = 60 BM/WUX/3.24M0   BUN/Creatinine Ratio SEE NOTE: 6 - 22 (calc)    Comment:    Not Reported: BUN and Creatinine are within    reference range. .    Sodium 140 135 - 146 mmol/L   Potassium  4.3 3.5 - 5.3 mmol/L   Chloride 105 98 - 110 mmol/L   CO2 26 20 - 32 mmol/L   Calcium 9.4 8.6 -  10.3 mg/dL   Total Protein 7.6 6.1 - 8.1 g/dL   Albumin 4.2 3.6 - 5.1 g/dL   Globulin 3.4 1.9 - 3.7 g/dL (calc)   AG Ratio 1.2 1.0 - 2.5 (calc)   Total Bilirubin 0.4 0.2 - 1.2 mg/dL   Alkaline phosphatase (APISO) 77 36 - 130 U/L   AST 13 10 - 40 U/L   ALT 20 9 - 46 U/L  Sed Rate (ESR)     Status: Abnormal   Collection Time: 10/17/22 11:33 AM  Result Value Ref Range   Sed Rate 17 (H) 0 - 15 mm/h  CRP (C-Reactive Protein)     Status: Abnormal   Collection Time: 10/17/22 11:33 AM  Result Value Ref Range   CRP 31.3 (H) <8.0 mg/L      Constitutional:  Wt 280 lb (127 kg)   BMI 43.85 kg/m    Musculoskeletal: Strength & Muscle Tone: within normal limits Gait & Station: normal Patient leans: N/A  Psychiatric Specialty Exam: Physical Exam  ROS  Weight 280 lb (127 kg).There is no height or weight on file to calculate BMI.  General Appearance: Casual and Guarded  Eye Contact:  Fair  Speech:   fast  Volume:  Increased  Mood:  Anxious  Affect:  Labile  Thought Process:  Descriptions of Associations: Circumstantial  Orientation:  Full (Time, Place, and Person)  Thought Content:  Paranoid Ideation and Rumination  Suicidal Thoughts:  No  Homicidal Thoughts:  No  Memory:  Immediate;   Fair Recent;   Fair Remote;   Fair  Judgement:  Fair  Insight:  Shallow  Psychomotor Activity:  Increased  Concentration:  Concentration: Fair and Attention Span: Fair  Recall:  [employer] of Knowledge:  Fair  Language:  Fair  Akathisia:  No  Handed:  Right  AIMS (if indicated):     Assets:  Communication Skills Desire for Improvement Housing  ADL's:  Intact  Cognition:  WNL  Sleep:   poor     Assessment/Plan:  Patient is 27 year old man with significant history of drug use, ADHD, mood symptoms want to establish care with a psychiatrist to get some help.  I reviewed blood work results,  current medication, psychosocial stressors.  His UDS positive in November for cocaine, stimulants, marijuana and opiates.  We talk about stopping the drugs and he is working on it.  He is not taking fluoxetine, amitriptyline and Tegretol because he does not feel it works.  He like to try a different medication.  After some discussion we agreed to give a trial of Abilify which we will start 2.5 mg for 1 week and then 5 mg daily.  He also like to try something for anxiety and we will try gabapentin 100 mg 2 times a day.  He has ADHD but at this time we will not start any stimulant and if needed we may consider giving Strattera.  Patient is going to [place] next week for 4 weeks but willing to have a follow-up appointment before his traveling.  Discuss safety concerns and any time having active suicidal thoughts or homicidal halogen need to call 911 or go to local emergency room.  We will follow-up in 1 week to see if medicine helping.  We will also schedule with the therapy once he come back from [place].  1. Polysubstance abuse (HCC)  - gabapentin (NEURONTIN) 100 MG capsule; Take 1 capsule (100 mg total) by mouth 2 (two) times daily.  Dispense: 60 capsule; Refill:  0  2. Bipolar I disorder (HCC)  - ARIPiprazole (ABILIFY) 5 MG tablet; Take 1/2 tab daily for one week and than full tab daily  Dispense: 30 tablet; Refill: 0 - gabapentin (NEURONTIN) 100 MG capsule; Take 1 capsule (100 mg total) by mouth 2 (two) times daily.  Dispense: 60 capsule; Refill: 0  3. Attention deficit hyperactivity disorder (ADHD), combined type  - gabapentin (NEURONTIN) 100 MG capsule; Take 1 capsule (100 mg total) by mouth 2 (two) times daily.  Dispense: 60 capsule; Refill: 0  4. GAD (generalized anxiety disorder)  - gabapentin (NEURONTIN) 100 MG capsule; Take 1 capsule (100 mg total) by mouth 2 (two) times daily.  Dispense: 60 capsule; Refill: 0   Devin NipperSyed T Berthold Glace, MD 10/18/2022    Follow Up Instructions: I discussed the  assessment and treatment plan with the patient. The patient was provided an opportunity to ask questions and all were answered. The patient agreed with the plan and demonstrated an understanding of the instructions.   The patient was advised to call back or seek an in-person evaluation if the symptoms worsen or if the condition fails to improve as anticipated.   Collaboration of Care: Primary Care Provider AEB notes are available in epic to review.   Patient/Guardian was advised Release of Information must be obtained prior to any record release in order to collaborate their care with an outside provider. Patient/Guardian was advised if they have not already done so to contact the registration department to sign all necessary forms in order for us to release information regarding their care.    Consent: Patient/Guardian gives verbal consent for treatment and assignment of benefits for services provided during this visit. Patient/Guardian expressed understanding and agreed to proceed.     I provided 75 minutes of non-face-to-face time during this encounter.

## 2022-10-19 ENCOUNTER — Telehealth: Payer: Self-pay | Admitting: Internal Medicine

## 2022-10-19 MED ORDER — ALPRAZOLAM 0.25 MG PO TABS: 0.2500 mg | ORAL_TABLET | ORAL | 0 refills | Status: DC | PRN

## 2022-10-19 NOTE — Telephone Encounter (Signed)
Pt requested anxiolytic for MRI. Xanax 0.25 sent. Verified with triage he has taken xanax before and tolerated it. Xanax administered in 2023 as well per EMR.

## 2022-10-23 ENCOUNTER — Telehealth (HOSPITAL_BASED_OUTPATIENT_CLINIC_OR_DEPARTMENT_OTHER): Payer: BLUE CROSS/BLUE SHIELD | Admitting: Psychiatry

## 2022-10-23 ENCOUNTER — Telehealth: Payer: Self-pay | Admitting: Internal Medicine

## 2022-10-23 ENCOUNTER — Encounter (HOSPITAL_COMMUNITY): Payer: Self-pay | Admitting: Psychiatry

## 2022-10-23 VITALS — Wt 280.0 lb

## 2022-10-23 DIAGNOSIS — F1910 Other psychoactive substance abuse, uncomplicated: Secondary | ICD-10-CM

## 2022-10-23 DIAGNOSIS — F411 Generalized anxiety disorder: Secondary | ICD-10-CM

## 2022-10-23 DIAGNOSIS — F902 Attention-deficit hyperactivity disorder, combined type: Secondary | ICD-10-CM

## 2022-10-23 DIAGNOSIS — F319 Bipolar disorder, unspecified: Secondary | ICD-10-CM

## 2022-10-23 MED ORDER — GABAPENTIN 100 MG PO CAPS: 100.0000 mg | ORAL_CAPSULE | Freq: Three times a day (TID) | ORAL | 0 refills | Status: DC

## 2022-10-23 MED ORDER — HYDROXYZINE HCL 10 MG PO TABS: 10.0000 mg | ORAL_TABLET | Freq: Two times a day (BID) | ORAL | 0 refills | Status: DC | PRN

## 2022-10-23 MED ORDER — ARIPIPRAZOLE 5 MG PO TABS: 5.0000 mg | ORAL_TABLET | Freq: Every day | ORAL | 0 refills | Status: DC
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
## 2022-10-23 NOTE — Telephone Encounter (Signed)
Called pt relay results of MRI. Counseled to stop abx and repeat labs in 1 week. Suspect leukocytosis may have been 2/2 linezolid(inc eos 3.3K)Pt is leaving the country and won't f/u in 1 week. As such given appt in 1 month, with plan to repeat labs

## 2022-10-23 NOTE — Telephone Encounter (Signed)
MRI resulted at outside facility: Imaging is stable, no c/n for infection. Stop antibioics. Follow-up in 1 week with labs. Will call pt between 8am -5 pm to discuss plna.

## 2022-10-25 ENCOUNTER — Telehealth (INDEPENDENT_AMBULATORY_CARE_PROVIDER_SITE_OTHER): Payer: BLUE CROSS/BLUE SHIELD | Admitting: Internal Medicine

## 2022-10-25 ENCOUNTER — Other Ambulatory Visit: Payer: Self-pay

## 2022-10-25 DIAGNOSIS — G060 Intracranial abscess and granuloma: Secondary | ICD-10-CM

## 2022-10-25 NOTE — Progress Notes (Signed)
Called pt x 3 no answer. Plan as discussed over the phone on 10/23/22

## 2022-11-22 ENCOUNTER — Inpatient Hospital Stay: Payer: BLUE CROSS/BLUE SHIELD | Admitting: Internal Medicine

## 2022-11-29 ENCOUNTER — Other Ambulatory Visit (HOSPITAL_COMMUNITY): Payer: Self-pay | Admitting: Psychiatry

## 2022-11-29 DIAGNOSIS — F1910 Other psychoactive substance abuse, uncomplicated: Secondary | ICD-10-CM

## 2022-11-29 DIAGNOSIS — F319 Bipolar disorder, unspecified: Secondary | ICD-10-CM

## 2022-11-29 DIAGNOSIS — F902 Attention-deficit hyperactivity disorder, combined type: Secondary | ICD-10-CM

## 2022-11-29 DIAGNOSIS — F411 Generalized anxiety disorder: Secondary | ICD-10-CM

## 2022-12-04 ENCOUNTER — Other Ambulatory Visit: Payer: Self-pay

## 2022-12-04 ENCOUNTER — Ambulatory Visit (INDEPENDENT_AMBULATORY_CARE_PROVIDER_SITE_OTHER): Payer: BLUE CROSS/BLUE SHIELD | Admitting: Internal Medicine

## 2022-12-04 ENCOUNTER — Encounter: Payer: Self-pay | Admitting: Internal Medicine

## 2022-12-04 VITALS — BP 139/87 | HR 95 | Temp 97.0°F | Ht 66.0 in | Wt 273.0 lb

## 2022-12-04 DIAGNOSIS — G060 Intracranial abscess and granuloma: Secondary | ICD-10-CM | POA: Diagnosis not present

## 2022-12-04 LAB — CBC WITH DIFFERENTIAL/PLATELET
Absolute Monocytes: 616 cells/uL (ref 200–950)
Basophils Absolute: 123 cells/uL (ref 0–200)
Basophils Relative: 0.8 %
Eosinophils Absolute: 2418 cells/uL — ABNORMAL HIGH (ref 15–500)
Eosinophils Relative: 15.7 %
HCT: 49.1 % (ref 38.5–50.0)
Hemoglobin: 15.5 g/dL (ref 13.2–17.1)
Lymphs Abs: 5313 cells/uL — ABNORMAL HIGH (ref 850–3900)
MCH: 25 pg — ABNORMAL LOW (ref 27.0–33.0)
MCHC: 31.6 g/dL — ABNORMAL LOW (ref 32.0–36.0)
MCV: 79.2 fL — ABNORMAL LOW (ref 80.0–100.0)
MPV: 9.3 fL (ref 7.5–12.5)
Monocytes Relative: 4 %
Neutro Abs: 6930 cells/uL (ref 1500–7800)
Neutrophils Relative %: 45 %
Platelets: 677 10*3/uL — ABNORMAL HIGH (ref 140–400)
RBC: 6.2 10*6/uL — ABNORMAL HIGH (ref 4.20–5.80)
RDW: 14.5 % (ref 11.0–15.0)
Total Lymphocyte: 34.5 %
WBC: 15.4 10*3/uL — ABNORMAL HIGH (ref 3.8–10.8)

## 2022-12-04 NOTE — Progress Notes (Unsigned)
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   ARIPIPRAZOLE (ABILIFY) 5 MG TABLET    Take 1 tablet (5 mg total) by mouth daily.   CARBAMAZEPINE (TEGRETOL XR) 400 MG 12 HR TABLET    Take 400 mg by mouth daily.   FLUVOXAMINE (LUVOX) 100 MG TABLET    Take 1 tablet (100 mg total) by mouth in the morning.   GABAPENTIN (NEURONTIN) 100 MG CAPSULE    Take 1 capsule (100 mg total) by mouth 3 (three) times daily.   HYDROXYZINE (ATARAX) 10 MG TABLET    Take 1 tablet (10 mg total) by mouth 2 (two) times daily as needed for anxiety.   LINEZOLID (ZYVOX) 600 MG TABLET    Take 1 tablet (600 mg total) by mouth 2 (two) times daily.   PANTOPRAZOLE (PROTONIX) 40 MG TABLET    Take 1 tablet (40 mg total) by mouth daily.  Modified Medications   No medications on file  Discontinued Medications   No medications on file    Subjective: [name] with  past medical history of IVDA, knee dislocation, obesity presents for hospital follow-up of skull abscess status post right craniectomy with massive sections, cultures growing P acnes.  Patient reports there was a mass there for a couple months with headache.  He noted the mass after a fall.  MRI showed peripheral enhancing collection  along the right frontal lobe concerning for abscess versus neoplasm.  Started on vancomycin, cefepime, metronidazole.Taken to OR on 11/14 for right craniectomy with NSY.  Cultures eventually grew P acnes.  Patient was transitioned to penicillin.  Given his a history of IVDA he was discharged on linezolid x 1 month.  He received IV antibiotics while hospitalized 11/14-12/5. / 09/21/22:Pt reports feel emotionally labile. He reports IVDA was prior to hospitalization. Pt has not gone back to work.  Missed a couple of doses of abx.  10/17/22: Pt reports he has appointment with behavioral health. He denies HA, fever and chills  Today 12/04/22: Doing well. Stopped linezolid. No new complaints.  Review of Systems: Review of Systems  All other systems reviewed and are negative.   Past Medical History:  Diagnosis Date   Knee dislocation    Obesity     Social History   Tobacco Use   Smoking status: Every Day    Packs/day: 1.5    Types: Cigarettes   Smokeless tobacco: Current    Types: Chew  Substance Use Topics   Alcohol use: No    Comment: occasionally   Drug use: Yes    Types: Methamphetamines, Heroin, Marijuana, Fentanyl    Comment: last dose yesterday    Family History  Problem Relation Age of Onset  Diabetes Mother    Hyperlipidemia Mother    Hypertension Father    Kidney disease Sister    Diabetes Maternal Grandmother    Diabetes Paternal Grandfather     Allergies  Allergen Reactions   Other     Pt prefers medications that do not have gelatins. No capsules, gel caps etc....due to religious reasons  Pt also had a reaction to laughing gas (liquid form)     Health Maintenance  Topic Date Due   COVID-19 Vaccine (1) Never done   HPV VACCINES (1 - Male 2-dose series) Never done   INFLUENZA VACCINE  Never done   DTaP/Tdap/Td (2 - Td or Tdap) 07/31/2032   Hepatitis C Screening  Completed   HIV Screening  Completed    Objective:  Vitals:   12/04/22 1533  BP: 139/87   Pulse: 95  Temp: (!) 97 F (36.1 C)  TempSrc: Temporal  SpO2: 96%  Weight: 273 lb (123.8 kg)  Height: 5\' 6"  (1.676 m)   Body mass index is 44.06 kg/m.  Physical Exam Constitutional:      General: He is not in acute distress.    Appearance: He is normal weight. He is not toxic-appearing.  HENT:     Head: Normocephalic and atraumatic.     Right Ear: External ear normal.     Left Ear: External ear normal.     Nose: No congestion or rhinorrhea.     Mouth/Throat:     Mouth: Mucous membranes are moist.     Pharynx: Oropharynx is clear.  Eyes:     Extraocular Movements: Extraocular movements intact.     Conjunctiva/sclera: Conjunctivae normal.     Pupils: Pupils are equal, round, and reactive to light.  Cardiovascular:     Rate and Rhythm: Normal rate and regular rhythm.     Heart sounds: No murmur heard.    No friction rub. No gallop.  Pulmonary:     Effort: Pulmonary effort is normal.     Breath sounds: Normal breath sounds.  Abdominal:     General: Abdomen is flat. Bowel sounds are normal.     Palpations: Abdomen is soft.  Musculoskeletal:        General: No swelling. Normal range of motion.     Cervical back: Normal range of motion and neck supple.  Skin:    General: Skin is warm and dry.  Neurological:     General: No focal deficit present.     Mental Status: He is oriented to person, place, and time.  Psychiatric:        Mood and Affect: Mood normal.     Lab Results Lab Results  Component Value Date   WBC 13.2 (H) 10/17/2022   HGB 14.9 10/17/2022   HCT 46.0 10/17/2022   MCV 81.1 10/17/2022   PLT 569 (H) 10/17/2022    Lab Results  Component Value Date   CREATININE 0.69 10/17/2022   BUN 8 10/17/2022   NA 140 10/17/2022   K 4.3 10/17/2022   CL 105 10/17/2022   CO2 26 10/17/2022    Lab Results  Component Value Date   ALT 20 10/17/2022   AST 13 10/17/2022   ALKPHOS 51 08/19/2022   BILITOT 0.4 10/17/2022    Lab Results  Component Value Date    CHOL 157 04/06/2022   HDL 60 04/06/2022   LDLCALC 84 04/06/2022   TRIG 57 04/06/2022   CHOLHDL 2.6 04/06/2022   Lab Results  Component Value Date  LABRPR NON REACTIVE 08/01/2022   No results found for: "HIV1RNAQUANT", "HIV1RNAVL", "CD4TABS"   Problem List Items Addressed This Visit   None  Assessment/Plan #Persistent leukocytosis-likely was drug related as eos >3k on linezolid # 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 -Repeat MRI seem stable -Given P acnes and leukocytosis. did a prolonged course aof abx. Repeat MRI on 2/2 showed no evidence of  intracranial infection. Linezolid stopped. Plan: -cbc today(f/u on wbc) -F/U PRN    Laurice Record, MD [facility] 12/04/2022, 3:43 PM

## 2022-12-04 NOTE — Telephone Encounter (Signed)
Rx REFILL REQUEST -- [id number] - [place], [place] - [address] AT [address]   hydrOXYzine (ATARAX) 10 MG tablet  LAST FILL DATE: 10/23/22  NEXT APPT::12/05/22 LAST APPT:: 10/23/22

## 2022-12-05 ENCOUNTER — Encounter (HOSPITAL_COMMUNITY): Payer: Self-pay | Admitting: Psychiatry

## 2022-12-05 ENCOUNTER — Telehealth: Payer: Self-pay | Admitting: Internal Medicine

## 2022-12-05 ENCOUNTER — Telehealth (HOSPITAL_BASED_OUTPATIENT_CLINIC_OR_DEPARTMENT_OTHER): Payer: BLUE CROSS/BLUE SHIELD | Admitting: Psychiatry

## 2022-12-05 DIAGNOSIS — F319 Bipolar disorder, unspecified: Secondary | ICD-10-CM

## 2022-12-05 DIAGNOSIS — F1910 Other psychoactive substance abuse, uncomplicated: Secondary | ICD-10-CM | POA: Diagnosis not present

## 2022-12-05 DIAGNOSIS — F411 Generalized anxiety disorder: Secondary | ICD-10-CM

## 2022-12-05 DIAGNOSIS — F902 Attention-deficit hyperactivity disorder, combined type: Secondary | ICD-10-CM | POA: Diagnosis not present

## 2022-12-05 DIAGNOSIS — D721 Eosinophilia, unspecified: Secondary | ICD-10-CM

## 2022-12-05 MED ORDER — GABAPENTIN 100 MG PO CAPS: ORAL_CAPSULE | ORAL | 1 refills | Status: DC

## 2022-12-05 MED ORDER — HYDROXYZINE HCL 10 MG PO TABS: 10.0000 mg | ORAL_TABLET | Freq: Two times a day (BID) | ORAL | 1 refills | Status: DC | PRN

## 2022-12-05 MED ORDER — ARIPIPRAZOLE 5 MG PO TABS: 5.0000 mg | ORAL_TABLET | Freq: Every day | ORAL | 1 refills | Status: DC

## 2022-12-05 NOTE — Telephone Encounter (Signed)
White cell count is elevated. Referring pt to heme/onc

## 2022-12-05 NOTE — Progress Notes (Signed)
[facility] MD Virtual Progress Note   Patient Location: In Car Provider Location: Home Office  I connect with patient by video and verified that I am speaking with correct person by using two identifiers. I discussed the limitations of evaluation and management by telemedicine and the availability of in person appointments. I also discussed with the patient that there may be a patient responsible charge related to this service. The patient expressed understanding and agreed to proceed.  Enver Meller RN:[id number] 27 y.o.  12/05/2022 3:40 PM  History of Present Illness:  Patient is evaluated by video session.  He just had a full week long location to [place] with his friend.  Patient feels very good and he reported it was needed.  He is doing better with Abilify.  He forgot to take the hydroxyzine and he had trouble sleeping there.  Since he is back he is back on hydroxyzine.  He is sleeping better.  He claims that his been sober from drugs and alcohol and he did not do any party while he was in [place].  He reported his mood is much better and is not as emotional, anger, irritable, mood swings.  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  He had a skull infection.  He is getting along better with his family member.  He denies any highs and lows, impulsive behavior.  He admitted weight loss because he was very active and his place where he was living was on the fourth floor.  He admitted taking the gabapentin 4 times because it is helping his anxiety and he like to see if we can adjust the pills.  He is taking Abilify 5 mg and he reported no tremors, shakes or any EPS.  He feels he is making progress and does not want to go back.  He reported his thoughts are much clearer and he is more relaxed.  Patient lives with his parents and he is the oldest among his siblings.  He gets sometimes distracted but manageable.  Past Psychiatric  History: History of poor impulse control, highs and lows, paranoia, heavy drug use.  History of using cocaine, IVDA, crystal meth and multiple rehab.  Diagnosed with ADHD and given Adderall by a local psychiatrist which he continued overseas.  History of overdose in 2019 when he was living in [place]. Given fluoxetine, amitriptyline and Tegretol overseas but stopped the medicine in [place] when it did not work.    Outpatient Encounter Medications as of 12/05/2022  Medication Sig   ALPRAZolam (XANAX) 0.25 MG tablet Take 1 tablet (0.25 mg total) by mouth as needed for anxiety. (Patient not taking: Reported on 10/23/2022)   ARIPiprazole (ABILIFY) 5 MG tablet Take 1 tablet (5 mg total) by mouth daily.   carbamazepine (TEGRETOL XR) 400 MG 12 hr tablet Take 400 mg by mouth daily. (Patient not taking: Reported on 10/17/2022)   fluvoxaMINE (LUVOX) 100 MG tablet Take 1 tablet (100 mg total) by mouth in the morning. (Patient not taking: Reported on 10/17/2022)   gabapentin (NEURONTIN) 100 MG capsule Take 1 capsule (100 mg total) by mouth 3 (three) times daily.   hydrOXYzine (ATARAX) 10 MG tablet Take 1 tablet (10 mg total) by mouth 2 (two) times daily as needed for anxiety.   linezolid (ZYVOX) 600 MG tablet Take 1 tablet (600 mg total) by mouth 2 (two) times daily. (Patient not taking: Reported on 12/04/2022)   pantoprazole (PROTONIX) 40 MG tablet Take  1 tablet (40 mg total) by mouth daily. (Patient not taking: Reported on 09/21/2022)   No facility-administered encounter medications on file as of 12/05/2022.    Recent Results (from the past 2160 hour(s))  CBC with Differential/Platelet     Status: Abnormal   Collection Time: 09/21/22 10:45 AM  Result Value Ref Range   WBC 12.3 (H) 3.8 - 10.8 Thousand/uL   RBC 5.15 4.20 - 5.80 Million/uL   Hemoglobin 13.8 13.2 - 17.1 g/dL   HCT 41.9 38.5 - 50.0 %   MCV 81.4 80.0 - 100.0 fL   MCH 26.8 (L) 27.0 - 33.0 pg   MCHC 32.9 32.0 - 36.0 g/dL   RDW 14.6 11.0 - 15.0 %    Platelets 353 140 - 400 Thousand/uL   MPV 9.5 7.5 - 12.5 fL   Neutro Abs 3,383 1,500 - 7,800 cells/uL   Lymphs Abs 3,973 (H) 850 - 3,900 cells/uL   Absolute Monocytes 467 200 - 950 cells/uL   Eosinophils Absolute 4,317 (H) 15 - 500 cells/uL   Basophils Absolute 160 0 - 200 cells/uL   Neutrophils Relative % 27.5 %   Total Lymphocyte 32.3 %   Monocytes Relative 3.8 %   Eosinophils Relative 35.1 %   Basophils Relative 1.3 %   Smear Review      Comment: Review of peripheral smear confirms automated results.   Comprehensive metabolic panel     Status: Abnormal   Collection Time: 09/21/22 10:45 AM  Result Value Ref Range   Glucose, Bld 119 (H) 65 - 99 mg/dL    Comment: .            Fasting reference interval . For someone without known diabetes, a glucose value between 100 and 125 mg/dL is consistent with prediabetes and should be confirmed with a follow-up test. .    BUN 10 7 - 25 mg/dL   Creat 0.67 0.60 - 1.24 mg/dL   BUN/Creatinine Ratio SEE NOTE: 6 - 22 (calc)    Comment:    Not Reported: BUN and Creatinine are within    reference range. .    Sodium 139 135 - 146 mmol/L   Potassium 4.1 3.5 - 5.3 mmol/L   Chloride 107 98 - 110 mmol/L   CO2 22 20 - 32 mmol/L   Calcium 8.6 8.6 - 10.3 mg/dL   Total Protein 6.7 6.1 - 8.1 g/dL   Albumin 3.9 3.6 - 5.1 g/dL   Globulin 2.8 1.9 - 3.7 g/dL (calc)   AG Ratio 1.4 1.0 - 2.5 (calc)   Total Bilirubin 0.2 0.2 - 1.2 mg/dL   Alkaline phosphatase (APISO) 82 36 - 130 U/L   AST 11 10 - 40 U/L   ALT 14 9 - 46 U/L  Sed Rate (ESR)     Status: None   Collection Time: 09/21/22 10:45 AM  Result Value Ref Range   Sed Rate 9 0 - 15 mm/h  CRP (C-Reactive Protein)     Status: Abnormal   Collection Time: 09/21/22 10:45 AM  Result Value Ref Range   CRP 26.8 (H) <8.0 mg/L  CBC w/Diff     Status: Abnormal   Collection Time: 10/03/22  3:40 AM  Result Value Ref Range   WBC 15.3 (H) 3.8 - 10.8 Thousand/uL   RBC 5.54 4.20 - 5.80 Million/uL    Hemoglobin 14.5 13.2 - 17.1 g/dL   HCT 44.9 38.5 - 50.0 %   MCV 81.0 80.0 - 100.0 fL   MCH 26.2 (  L) 27.0 - 33.0 pg   MCHC 32.3 32.0 - 36.0 g/dL   RDW 14.8 11.0 - 15.0 %   Platelets 588 (H) 140 - 400 Thousand/uL   MPV 9.7 7.5 - 12.5 fL   Neutro Abs 4,927 1,500 - 7,800 cells/uL   Lymphs Abs 5,722 (H) 850 - 3,900 cells/uL   Absolute Monocytes 857 200 - 950 cells/uL   Eosinophils Absolute 3,657 (H) 15 - 500 cells/uL   Basophils Absolute 138 0 - 200 cells/uL   Neutrophils Relative % 32.2 %   Total Lymphocyte 37.4 %   Monocytes Relative 5.6 %   Eosinophils Relative 23.9 %   Basophils Relative 0.9 %  Sedimentation rate     Status: None   Collection Time: 10/03/22  3:40 AM  Result Value Ref Range   Sed Rate 14 0 - 15 mm/h  Comp Met (CMET)     Status: None   Collection Time: 10/03/22  3:40 AM  Result Value Ref Range   Glucose, Bld 69 65 - 99 mg/dL    Comment: .            Fasting reference interval .    BUN 8 7 - 25 mg/dL   Creat 0.69 0.60 - 1.24 mg/dL   BUN/Creatinine Ratio SEE NOTE: 6 - 22 (calc)    Comment:    Not Reported: BUN and Creatinine are within    reference range. .    Sodium 137 135 - 146 mmol/L   Potassium 3.9 3.5 - 5.3 mmol/L   Chloride 103 98 - 110 mmol/L   CO2 23 20 - 32 mmol/L   Calcium 9.1 8.6 - 10.3 mg/dL   Total Protein 7.6 6.1 - 8.1 g/dL   Albumin 4.2 3.6 - 5.1 g/dL   Globulin 3.4 1.9 - 3.7 g/dL (calc)   AG Ratio 1.2 1.0 - 2.5 (calc)   Total Bilirubin 0.4 0.2 - 1.2 mg/dL   Alkaline phosphatase (APISO) 85 36 - 130 U/L   AST 11 10 - 40 U/L   ALT 14 9 - 46 U/L  C-reactive protein     Status: Abnormal   Collection Time: 10/03/22  3:40 AM  Result Value Ref Range   CRP 14.2 (H) <8.0 mg/L  CBC with Differential/Platelet     Status: Abnormal   Collection Time: 10/17/22 11:33 AM  Result Value Ref Range   WBC 13.2 (H) 3.8 - 10.8 Thousand/uL   RBC 5.67 4.20 - 5.80 Million/uL   Hemoglobin 14.9 13.2 - 17.1 g/dL   HCT 46.0 38.5 - 50.0 %   MCV 81.1 80.0 -  100.0 fL   MCH 26.3 (L) 27.0 - 33.0 pg   MCHC 32.4 32.0 - 36.0 g/dL   RDW 14.8 11.0 - 15.0 %   Platelets 569 (H) 140 - 400 Thousand/uL   MPV 9.4 7.5 - 12.5 fL   Neutro Abs 5,108 1,500 - 7,800 cells/uL   Lymphs Abs 4,066 (H) 850 - 3,900 cells/uL   Absolute Monocytes 554 200 - 950 cells/uL   Eosinophils Absolute 3,326 (H) 15 - 500 cells/uL   Basophils Absolute 145 0 - 200 cells/uL   Neutrophils Relative % 38.7 %   Total Lymphocyte 30.8 %   Monocytes Relative 4.2 %   Eosinophils Relative 25.2 %   Basophils Relative 1.1 %  COMPLETE METABOLIC PANEL WITH GFR     Status: None   Collection Time: 10/17/22 11:33 AM  Result Value Ref Range   Glucose, Bld 71 65 -  99 mg/dL    Comment: .            Fasting reference interval .    BUN 8 7 - 25 mg/dL   Creat 0.69 0.60 - 1.24 mg/dL   eGFR 132 > OR = 60 mL/min/1.55m2   BUN/Creatinine Ratio SEE NOTE: 6 - 22 (calc)    Comment:    Not Reported: BUN and Creatinine are within    reference range. .    Sodium 140 135 - 146 mmol/L   Potassium 4.3 3.5 - 5.3 mmol/L   Chloride 105 98 - 110 mmol/L   CO2 26 20 - 32 mmol/L   Calcium 9.4 8.6 - 10.3 mg/dL   Total Protein 7.6 6.1 - 8.1 g/dL   Albumin 4.2 3.6 - 5.1 g/dL   Globulin 3.4 1.9 - 3.7 g/dL (calc)   AG Ratio 1.2 1.0 - 2.5 (calc)   Total Bilirubin 0.4 0.2 - 1.2 mg/dL   Alkaline phosphatase (APISO) 77 36 - 130 U/L   AST 13 10 - 40 U/L   ALT 20 9 - 46 U/L  Sed Rate (ESR)     Status: Abnormal   Collection Time: 10/17/22 11:33 AM  Result Value Ref Range   Sed Rate 17 (H) 0 - 15 mm/h  CRP (C-Reactive Protein)     Status: Abnormal   Collection Time: 10/17/22 11:33 AM  Result Value Ref Range   CRP 31.3 (H) <8.0 mg/L  CBC with Differential/Platelet     Status: Abnormal   Collection Time: 12/04/22  4:00 PM  Result Value Ref Range   WBC 15.4 (H) 3.8 - 10.8 Thousand/uL   RBC 6.20 (H) 4.20 - 5.80 Million/uL   Hemoglobin 15.5 13.2 - 17.1 g/dL   HCT 49.1 38.5 - 50.0 %   MCV 79.2 (L) 80.0 - 100.0 fL    MCH 25.0 (L) 27.0 - 33.0 pg   MCHC 31.6 (L) 32.0 - 36.0 g/dL   RDW 14.5 11.0 - 15.0 %   Platelets 677 (H) 140 - 400 Thousand/uL   MPV 9.3 7.5 - 12.5 fL   Neutro Abs 6,930 1,500 - 7,800 cells/uL   Lymphs Abs 5,313 (H) 850 - 3,900 cells/uL   Absolute Monocytes 616 200 - 950 cells/uL   Eosinophils Absolute 2,418 (H) 15 - 500 cells/uL   Basophils Absolute 123 0 - 200 cells/uL   Neutrophils Relative % 45 %   Total Lymphocyte 34.5 %   Monocytes Relative 4.0 %   Eosinophils Relative 15.7 %   Basophils Relative 0.8 %     Psychiatric Specialty Exam: Physical Exam  Review of Systems  Weight 273 lb (123.8 kg).There is no height or weight on file to calculate BMI.  General Appearance: Casual  Eye Contact:  Fair  Speech:   fast  Volume:  Normal  Mood:  Anxious  Affect:  Labile  Thought Process:  Descriptions of Associations: Intact  Orientation:  Full (Time, Place, and Person)  Thought Content:  Rumination and trust issues  Suicidal Thoughts:  No  Homicidal Thoughts:  No  Memory:  Immediate;   Good Recent;   Fair Remote;   Fair  Judgement:  Intact  Insight:  Shallow  Psychomotor Activity:  Increased  Concentration:  Concentration: Fair and Attention Span: Fair  Recall:  Good  Fund of Knowledge:  Fair  Language:  Good  Akathisia:  No  Handed:  Right  AIMS (if indicated):     Assets:  Communication Skills Desire for  Improvement Resilience Talents/Skills Transportation  ADL's:  Intact  Cognition:  WNL  Sleep:  fair     Assessment/Plan: Bipolar I disorder (HCC) - Plan: ARIPiprazole (ABILIFY) 5 MG tablet, gabapentin (NEURONTIN) 100 MG capsule, hydrOXYzine (ATARAX) 10 MG tablet  GAD (generalized anxiety disorder) - Plan: gabapentin (NEURONTIN) 100 MG capsule, hydrOXYzine (ATARAX) 10 MG tablet  Polysubstance abuse ([place]) - Plan: gabapentin (NEURONTIN) 100 MG capsule, hydrOXYzine (ATARAX) 10 MG tablet  Attention deficit hyperactivity disorder (ADHD), combined type - Plan:  gabapentin (NEURONTIN) 100 MG capsule, hydrOXYzine (ATARAX) 10 MG tablet  Review current medication and blood work results.  His WBC shows high count.  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  He is overall feeling better since taking the Abilify 5 mg daily.  He does not feel he need to see a therapist.  We talk about substance use and he claimed to be sober since the last visit.  Reemphasize to take the medicine as prescribed.  Continue Abilify 5 mg daily, continue hydroxyzine 10 mg as needed for anxiety and insomnia.  We will adjust gabapentin 100 mg 2 times a day and 2 pill at bedtime.  Discussed safety concern that anytime having active suicidal thoughts or homicidal thought then he need to call 911 or go to local emergency room.  We will follow-up in 2 months.   Follow Up Instructions:     I discussed the assessment and treatment plan with the patient. The patient was provided an opportunity to ask questions and all were answered. The patient agreed with the plan and demonstrated an understanding of the instructions.   The patient was advised to call back or seek an in-person evaluation if the symptoms worsen or if the condition fails to improve as anticipated.    Collaboration of Care: Other provider involved in patient's care AEB notes are available in epic to review.  Patient/Guardian was advised Release of Information must be obtained prior to any record release in order to collaborate their care with an outside provider. Patient/Guardian was advised if they have not already done so to contact the registration department to sign all necessary forms in order for [place] to release information regarding their care.   Consent: Patient/Guardian gives verbal consent for treatment and assignment of benefits for services provided during this visit. Patient/Guardian expressed understanding and agreed to proceed.     I provided 25 minutes of non face to  face time during this encounter.  Kathlee Nations, MD 12/05/2022

## 2022-12-06 NOTE — Telephone Encounter (Signed)
Called patient regarding WBC count and referral. Patient did not have any questions at this time. Will call office if he has not heard back from Hematology. Leatrice Jewels, RMA

## 2022-12-07 ENCOUNTER — Telehealth: Payer: Self-pay | Admitting: Hematology and Oncology

## 2022-12-07 NOTE — Telephone Encounter (Signed)
scheduled per 3/20 referral, pt has been called and confirmed date and time. Pt is aware of location and to arrive early for check in

## 2022-12-12 ENCOUNTER — Telehealth: Payer: Self-pay

## 2022-12-12 NOTE — Telephone Encounter (Signed)
-----   Message from Carlton Adam sent at 12/12/2022 10:11 AM EDT ----- Regarding: Pt requesting letter Pt called a moment ago requesting a letter from Dr. Candiss Norse to return to work also includes the date to return to work. I was unsure if this could be completed depending on his care plan, or if Dr. Candiss Norse would prefer an appt to be completed.  He was looking to pick this up. I told him we will give him a call.

## 2022-12-12 NOTE — Telephone Encounter (Signed)
Patient walked into clinic to follow up on note. Per Dr. Candiss Norse patient does not have any restrictions and can return to work. Employer asked that provider that seen him last write note stating he can return to work and when he was seen last.  Per dr. Candiss Norse okay to write note., Patient updated. Understands that letter well be on mychart. Leatrice Jewels, RMA

## 2022-12-25 ENCOUNTER — Inpatient Hospital Stay: Payer: BLUE CROSS/BLUE SHIELD

## 2022-12-25 ENCOUNTER — Inpatient Hospital Stay: Payer: BLUE CROSS/BLUE SHIELD | Attending: Hematology and Oncology | Admitting: Hematology and Oncology

## 2022-12-25 VITALS — BP 132/67 | HR 99 | Temp 97.4°F | Resp 17 | Wt 268.8 lb

## 2022-12-25 DIAGNOSIS — D75839 Thrombocytosis, unspecified: Secondary | ICD-10-CM

## 2022-12-25 DIAGNOSIS — D7219 Other eosinophilia: Secondary | ICD-10-CM

## 2022-12-25 DIAGNOSIS — D72829 Elevated white blood cell count, unspecified: Secondary | ICD-10-CM | POA: Diagnosis present

## 2022-12-25 LAB — C-REACTIVE PROTEIN: CRP: 2 mg/dL — ABNORMAL HIGH (ref <1.0)

## 2022-12-25 LAB — CMP (CANCER CENTER ONLY)
ALT: 16 U/L (ref 0–44)
AST: 13 U/L — ABNORMAL LOW (ref 15–41)
Albumin: 4.3 g/dL (ref 3.5–5.0)
Alkaline Phosphatase: 63 U/L (ref 38–126)
Anion gap: 8 (ref 5–15)
BUN: 7 mg/dL (ref 6–20)
CO2: 26 mmol/L (ref 22–32)
Calcium: 9.9 mg/dL (ref 8.9–10.3)
Chloride: 104 mmol/L (ref 98–111)
Creatinine: 0.85 mg/dL (ref 0.61–1.24)
GFR, Estimated: >60 mL/min (ref >60)
Glucose, Bld: 85 mg/dL (ref 70–99)
Potassium: 4.2 mmol/L (ref 3.5–5.1)
Sodium: 138 mmol/L (ref 135–145)
Total Bilirubin: 0.5 mg/dL (ref 0.3–1.2)
Total Protein: 8.4 g/dL — ABNORMAL HIGH (ref 6.5–8.1)

## 2022-12-25 LAB — CBC WITH DIFFERENTIAL (CANCER CENTER ONLY)
Abs Immature Granulocytes: 0.07 10*3/uL (ref 0.00–0.07)
Basophils Absolute: 0.2 10*3/uL — ABNORMAL HIGH (ref 0.0–0.1)
Basophils Relative: 1 %
Eosinophils Absolute: 3 10*3/uL — ABNORMAL HIGH (ref 0.0–0.5)
Eosinophils Relative: 24 %
HCT: 48 % (ref 39.0–52.0)
Hemoglobin: 15.2 g/dL (ref 13.0–17.0)
Immature Granulocytes: 1 %
Lymphocytes Relative: 36 %
Lymphs Abs: 4.6 10*3/uL — ABNORMAL HIGH (ref 0.7–4.0)
MCH: 25.1 pg — ABNORMAL LOW (ref 26.0–34.0)
MCHC: 31.7 g/dL (ref 30.0–36.0)
MCV: 79.2 fL — ABNORMAL LOW (ref 80.0–100.0)
Monocytes Absolute: 0.4 10*3/uL (ref 0.1–1.0)
Monocytes Relative: 3 %
Neutro Abs: 4.4 10*3/uL (ref 1.7–7.7)
Neutrophils Relative %: 35 %
Platelet Count: 619 10*3/uL — ABNORMAL HIGH (ref 150–400)
RBC: 6.06 MIL/uL — ABNORMAL HIGH (ref 4.22–5.81)
RDW: 17.1 % — ABNORMAL HIGH (ref 11.5–15.5)
WBC Count: 12.6 10*3/uL — ABNORMAL HIGH (ref 4.0–10.5)
nRBC: 0 % (ref 0.0–0.2)

## 2022-12-25 LAB — VITAMIN B12: Vitamin B-12: 227 pg/mL (ref 180–914)

## 2022-12-25 LAB — IRON AND IRON BINDING CAPACITY (CC-WL,HP ONLY)
Iron: 30 ug/dL — ABNORMAL LOW (ref 45–182)
Saturation Ratios: 7 % — ABNORMAL LOW (ref 17.9–39.5)
TIBC: 426 ug/dL (ref 250–450)
UIBC: 396 ug/dL — ABNORMAL HIGH (ref 117–376)

## 2022-12-25 LAB — SEDIMENTATION RATE: Sed Rate: 12 mm/hr (ref 0–16)

## 2022-12-25 LAB — LACTATE DEHYDROGENASE: LDH: 185 U/L (ref 98–192)

## 2022-12-25 LAB — FERRITIN: Ferritin: 32 ng/mL (ref 24–336)

## 2022-12-25 NOTE — Progress Notes (Signed)
[facility] [phone]   [phone]  INITIAL CONSULT NOTE  Patient Care Team: Patient, No Pcp Per as PCP - [facility] (General Practice) Pa, [facility] (Internal Medicine)  Hematological/Oncological History # Eosinophilia # Lymphocytosis 12/04/2022: WBC 15.4, Hgb 15.5, MCV 79.2, Plt 677. Lymph 5300, Eos 2400 12/25/2022: establish care with Dr. Leonides Schanz   CHIEF COMPLAINTS/PURPOSE OF CONSULTATION:  "Eosinophilia "  HISTORY OF PRESENTING ILLNESS:  Devin Barker 27 y.o. male with medical history significant for IV drug abuse, right craniotomy, osteomyelitis of the skull, bipolar disorder, who presents for evaluation of leukocytosis and thrombocytosis.  On review of the previous records Devin Barker has a longstanding history of leukocytosis and thrombocytosis dating back to at least 05/15/2016.  At that time he had white blood cell count 10.6 with platelets of 559.  It does appear at that time it was neutrophilic.  On exam today Devin Barker reports that he did surgery for osteomyelitis and the prior to that he was having headaches and nausea and vomiting.  He reports he has been much improved since the time of the surgery.  He notes that he completed his antibiotic therapy and is not currently on antibiotics.  He reports he has been having some issues with persistent stomach pain.  He described it is like a pulling or cramping pain.  He reports approximately 1 month ago he went to [place].  He did not swim in the water and did not eat anything unusual.  He had no illness while he was there.  He reports that he does have difficulty with sleeping and only sleeps about 3 to 5 hours/day but then on some days will sleep for 12 hours.  He reports that he does get "chills all the time" but no frank fevers or sweats.  He has been losing weight unintentionally.  He notes that he has been having issues with poor appetite and is "bored of food".  He is  lost 12 pounds this year.  He has had no issues with rash or joint pain and no new medications.  On further discussion he reports that his mother has type 2 diabetes and father has hypertension.  His paternal grandfather had bladder cancer he has 3 siblings all of whom are healthy.  He has no children personally.  He is an active smoker smoking 1 pack/day.  He does not drink any alcohol and currently works as a Radio broadcast assistant.  Other than his stomach pain he has had no other concerning symptoms.  A full 10 point ROS is otherwise negative.  MEDICAL HISTORY:  Past Medical History:  Diagnosis Date   Knee dislocation    Obesity     SURGICAL HISTORY: Past Surgical History:  Procedure Laterality Date   LUMBAR WOUND DEBRIDEMENT Right 07/31/2022   Procedure: Right Craniectomy for Mass Resection;  Surgeon: Coletta Memos, MD;  Location: [facility] OR;  Service: Neurosurgery;  Laterality: Right;   MEDIAL PATELLOFEMORAL LIGAMENT REPAIR Right 10/16/2013   Procedure: ARTHROSCOPY, RIGHT OPEN RECONSTRUCTION AT THE MEDIAL PATELLA FEMORAL LIGAMENT;  Surgeon: Senaida Lange, MD;  Location: [facility] OR;  Service: Orthopedics;  Laterality: Right;  ANESTHESIA: GENERAL/FEMORAL NERVE BLOCK   TONSILLECTOMY      SOCIAL HISTORY: Social History   Socioeconomic History   Marital status: Single    Spouse name: Not on file   Number of children: Not on file   Years of education: Not on file   Highest education level: Not on  file  Occupational History   Not on file  Tobacco Use   Smoking status: Every Day    Packs/day: 1.5    Types: Cigarettes   Smokeless tobacco: Current    Types: Chew  Substance and Sexual Activity   Alcohol use: No    Comment: occasionally   Drug use: Yes    Types: Methamphetamines, Heroin, Marijuana, Fentanyl    Comment: last dose yesterday   Sexual activity: Never  Other Topics Concern   Not on file  Social History Narrative   Not on file   Social Determinants of Health    Financial Resource Strain: Not on file  Food Insecurity: No Food Insecurity (08/10/2022)   Hunger Vital Sign    Worried About Running Out of Food in the Last Year: Never true    Ran Out of Food in the Last Year: Never true  Transportation Needs: No Transportation Needs (08/10/2022)   PRAPARE - Administrator, Civil Service (Medical): No    Lack of Transportation (Non-Medical): No  Physical Activity: Not on file  Stress: Not on file  Social Connections: Not on file  Intimate Partner Violence: Not At Risk (08/10/2022)   Humiliation, Afraid, Rape, and Kick questionnaire    Fear of Current or Ex-Partner: No    Emotionally Abused: No    Physically Abused: No    Sexually Abused: No    FAMILY HISTORY: Family History  Problem Relation Age of Onset   Diabetes Mother    Hyperlipidemia Mother    Hypertension Father    Kidney disease Sister    Diabetes Maternal Grandmother    Diabetes Paternal Grandfather     ALLERGIES:  is allergic to other.  MEDICATIONS:  Current Outpatient Medications  Medication Sig Dispense Refill   ARIPiprazole (ABILIFY) 5 MG tablet Take 1 tablet (5 mg total) by mouth daily. 30 tablet 1   gabapentin (NEURONTIN) 100 MG capsule Take one capsule twice a day and two capsule at bed time. 90 capsule 1   hydrOXYzine (ATARAX) 10 MG tablet Take 1 tablet (10 mg total) by mouth 2 (two) times daily as needed for anxiety. 30 tablet 1   No current facility-administered medications for this visit.    REVIEW OF SYSTEMS:   Constitutional: ( - ) fevers, ( - )  chills , ( - ) night sweats Eyes: ( - ) blurriness of vision, ( - ) double vision, ( - ) watery eyes Ears, nose, mouth, throat, and face: ( - ) mucositis, ( - ) sore throat Respiratory: ( - ) cough, ( - ) dyspnea, ( - ) wheezes Cardiovascular: ( - ) palpitation, ( - ) chest discomfort, ( - ) lower extremity swelling Gastrointestinal:  ( - ) nausea, ( - ) heartburn, ( - ) change in bowel habits Skin: ( -  ) abnormal skin rashes Lymphatics: ( - ) new lymphadenopathy, ( - ) easy bruising Neurological: ( - ) numbness, ( - ) tingling, ( - ) new weaknesses Behavioral/Psych: ( - ) mood change, ( - ) new changes  All other systems were reviewed with the patient and are negative.  PHYSICAL EXAMINATION:  Vitals:   12/25/22 1316  BP: 132/67  Pulse: 99  Resp: 17  Temp: (!) 97.4 F (36.3 C)  SpO2: 98%   Filed Weights   12/25/22 1316  Weight: 268 lb 12.8 oz (121.9 kg)    GENERAL: well appearing obese young male in NAD  SKIN: skin color, texture, turgor  are normal, no rashes or significant lesions EYES: conjunctiva are pink and non-injected, sclera clear LUNGS: clear to auscultation and percussion with normal breathing effort HEART: regular rate & rhythm and no murmurs and no lower extremity edema Musculoskeletal: no cyanosis of digits and no clubbing  PSYCH: alert & oriented x 3, fluent speech NEURO: no focal motor/sensory deficits  LABORATORY DATA:  I have reviewed the data as listed    Latest Ref Rng & Units 12/25/2022    1:52 PM 12/04/2022    4:00 PM 10/17/2022   11:33 AM  CBC  WBC 4.0 - 10.5 K/uL 12.6  15.4  13.2   Hemoglobin 13.0 - 17.0 g/dL 16.115.2  09.615.5  04.514.9   Hematocrit 39.0 - 52.0 % 48.0  49.1  46.0   Platelets 150 - 400 K/uL 619  677  569        Latest Ref Rng & Units 12/25/2022    1:52 PM 10/17/2022   11:33 AM 10/03/2022    3:40 AM  CMP  Glucose 70 - 99 mg/dL 85  71  69   BUN 6 - 20 mg/dL 7  8  8    Creatinine 0.61 - 1.24 mg/dL 4.090.85  8.110.69  9.140.69   Sodium 135 - 145 mmol/L 138  140  137   Potassium 3.5 - 5.1 mmol/L 4.2  4.3  3.9   Chloride 98 - 111 mmol/L 104  105  103   CO2 22 - 32 mmol/L 26  26  23    Calcium 8.9 - 10.3 mg/dL 9.9  9.4  9.1   Total Protein 6.5 - 8.1 g/dL 8.4  7.6  7.6   Total Bilirubin 0.3 - 1.2 mg/dL 0.5  0.4  0.4   Alkaline Phos 38 - 126 U/L 63     AST 15 - 41 U/L 13  13  11    ALT 0 - 44 U/L 16  20  14       ASSESSMENT & PLAN Devin Elobeid  Barker 27 y.o. male with medical history significant for IV drug abuse, right craniotomy, osteomyelitis of the skull, bipolar disorder, who presents for evaluation of leukocytosis and thrombocytosis.  After review of the labs, review of the records, and discussion with the patient the patients findings are most consistent with eosinophilia, leukocytosis, and thrombocytosis of unclear etiology.  At this time I strongly suspect underlying inflammation.  Will also rule out parasites, hypereosinophilia, and myeloproliferative neoplasm with the above workup.  This is an unusual pattern and is not consistent with the neutrophilia 1 would expect with his osteomyelitis.  #Leukocytosis consisting of Eosinophilia and Lymphocytosis #Thrombocytosis -- Suspect findings are secondary to inflammation.  Will order ESR and CRP. -- Will rule out parasites as an etiology by ordering strongyloidiasis antibodies as well as stool ova and parasite -- Will order iron studies with iron panel and ferritin. -- Will order hypereosinophilia workup with panel to be sent to [facility] -- Will order MPN panels with JAK2 as well as BCR/ABL FISH -- Return to clinic pending the results of the above studies.  Orders Placed This Encounter  Procedures   Fecal, O&P    Standing Status:   Future    Standing Expiration Date:   12/25/2023   CBC with Differential (Cancer Center Only)    Standing Status:   Future    Number of Occurrences:   1    Standing Expiration Date:   12/25/2023   CMP (Cancer Center only)    Standing Status:  Future    Number of Occurrences:   1    Standing Expiration Date:   12/25/2023   Flow Cytometry, Peripheral Blood (Oncology)    Standing Status:   Future    Number of Occurrences:   1    Standing Expiration Date:   12/25/2023   Lactate dehydrogenase (LDH)    Standing Status:   Future    Number of Occurrences:   1    Standing Expiration Date:   12/25/2023   Sedimentation rate    Standing Status:   Future     Number of Occurrences:   1    Standing Expiration Date:   12/25/2023   C-reactive protein    Standing Status:   Future    Number of Occurrences:   1    Standing Expiration Date:   12/25/2023   JAK2 (including V617F and Exon 12), MPL, and CALR-Next Generation Sequencing    Standing Status:   Future    Number of Occurrences:   1    Standing Expiration Date:   12/25/2023   BCR ABL1 FISH ([facility])    Standing Status:   Future    Number of Occurrences:   1    Standing Expiration Date:   12/25/2023   Strongyloides, Ab, IgG   Vitamin B12    Standing Status:   Future    Number of Occurrences:   1    Standing Expiration Date:   12/25/2023   Tryptase    Standing Status:   Future    Number of Occurrences:   1    Standing Expiration Date:   12/25/2023   Iron and Iron Binding Capacity ([facility] only)    Standing Status:   Future    Number of Occurrences:   1    Standing Expiration Date:   12/25/2023   Ferritin    Standing Status:   Future    Number of Occurrences:   1    Standing Expiration Date:   12/25/2023   PDGFRA   Miscellaneous test (send-out)   Miscellaneous test (send-out)    All questions were answered. The patient knows to call the clinic with any problems, questions or concerns.  A total of more than 60 minutes were spent on this encounter with face-to-face time and non-face-to-face time, including preparing to see the patient, ordering tests and/or medications, counseling the patient and coordination of care as outlined above.   Ulysees Barns, MD Department of Hematology/Oncology [facility] at [facility] Phone: [phone] Pager: [phone] Email: [email or web address]  12/25/2022 4:43 PM

## 2022-12-26 LAB — STRONGYLOIDES, AB, IGG: Strongyloides, Ab, IgG: NEGATIVE

## 2022-12-26 LAB — TRYPTASE: Tryptase: 5.5 ug/L (ref 2.2–13.2)

## 2022-12-27 ENCOUNTER — Telehealth (HOSPITAL_COMMUNITY): Payer: Self-pay | Admitting: *Deleted

## 2022-12-27 NOTE — Telephone Encounter (Signed)
Called Rx Patient has Refills

## 2022-12-29 LAB — SURGICAL PATHOLOGY

## 2023-01-02 LAB — FLOW CYTOMETRY

## 2023-01-03 LAB — BCR ABL1 FISH (GENPATH)

## 2023-01-03 LAB — JAK2 (INCLUDING V617F AND EXON 12), MPL,& CALR-NEXT GEN SEQ

## 2023-01-04 ENCOUNTER — Telehealth: Payer: Self-pay | Admitting: *Deleted

## 2023-01-04 NOTE — Telephone Encounter (Signed)
TCT patient regarding recent lab results.  Spoke with patient. Advised  that our extensive blood work did not find a clear reason for his hematological abnormalities. We did not find any genetic mutations or evidence of a blood cancer. At this time I would recommend performing a bone marrow biopsy to see if we can find a definitive diagnosis. Pt voiced understanding and he is agreeable to bone marrow biopsy.  Dr. Leonides Schanz made aware of this.

## 2023-01-04 NOTE — Telephone Encounter (Signed)
-----   Message from Jaci Standard, MD sent at 01/04/2023  9:39 AM EDT ----- Please let [name] know that our extensive blood work did not find a clear reason for his hematological abnormalities.  We did not find any genetic mutations or evidence of a blood cancer.  At this time I would recommend performing a bone marrow biopsy to see if we can find a definitive diagnosis.  If he is agreeable to a bone marrow biopsy I will place the order. ----- Message ----- From: Leory Plowman, [facility] [place] Sent: 12/25/2022   2:12 PM EDT To: Jaci Standard, MD

## 2023-01-08 ENCOUNTER — Other Ambulatory Visit: Payer: Self-pay | Admitting: Hematology and Oncology

## 2023-01-08 DIAGNOSIS — D75839 Thrombocytosis, unspecified: Secondary | ICD-10-CM

## 2023-01-08 LAB — MISCELLANEOUS TEST

## 2023-01-08 LAB — PDGFRA

## 2023-01-13 NOTE — H&P (Signed)
Chief Complaint: Patient was seen in consultation today for abnormal labs at the request of Jeanie Sewer, MD  Referring Physician(s): Jeanie Sewer, MD  Supervising Physician: Irish Lack  Patient Status: [facility] - Out-pt  History of Present Illness: Devin Barker is a 27 y.o. male who was referred by his PCP to oncology for evaluation of leukocytosis and thrombocytosis.  Patient has longstanding history of leukocytosis and thrombocytosis dating back to 2017.  At that time he had a white blood cell count of 10.6 with platelets of 559.  Patient consulted with Dr. Leonides Schanz 12/25/2022 complaining of difficulty sleeping, chills, unintentional weight loss and poor appetite.  Patient referred to IR for bone marrow biopsy and aspiration given abnormal blood counts.  Patient denies fever, chills, fatigue, shortness of breath, chest pain, N/V, dizziness, headaches or weakness. He is n.p.o. per order  Past Medical History:  Diagnosis Date   Knee dislocation    Obesity     Past Surgical History:  Procedure Laterality Date   LUMBAR WOUND DEBRIDEMENT Right 07/31/2022   Procedure: Right Craniectomy for Mass Resection;  Surgeon: Coletta Memos, MD;  Location: [facility] OR;  Service: Neurosurgery;  Laterality: Right;   MEDIAL PATELLOFEMORAL LIGAMENT REPAIR Right 10/16/2013   Procedure: ARTHROSCOPY, RIGHT OPEN RECONSTRUCTION AT THE MEDIAL PATELLA FEMORAL LIGAMENT;  Surgeon: Senaida Lange, MD;  Location: [facility] OR;  Service: Orthopedics;  Laterality: Right;  ANESTHESIA: GENERAL/FEMORAL NERVE BLOCK   TONSILLECTOMY      Allergies: Other  Medications: Prior to Admission medications   Medication Sig Start Date End Date Taking? Authorizing Provider  ARIPiprazole (ABILIFY) 5 MG tablet Take 1 tablet (5 mg total) by mouth daily. 12/05/22   Arfeen, [name] Grout, MD  gabapentin (NEURONTIN) 100 MG capsule Take one capsule twice a day and two capsule at bed time. 12/05/22   Arfeen, [name] Grout, MD  hydrOXYzine  (ATARAX) 10 MG tablet Take 1 tablet (10 mg total) by mouth 2 (two) times daily as needed for anxiety. 12/05/22   Arfeen, [name] Grout, MD     Family History  Problem Relation Age of Onset   Diabetes Mother    Hyperlipidemia Mother    Hypertension Father    Kidney disease Sister    Diabetes Maternal Grandmother    Diabetes Paternal Grandfather     Social History   Socioeconomic History   Marital status: Single    Spouse name: Not on file   Number of children: Not on file   Years of education: Not on file   Highest education level: Not on file  Occupational History   Not on file  Tobacco Use   Smoking status: Every Day    Packs/day: 1.5    Types: Cigarettes   Smokeless tobacco: Current    Types: Chew  Substance and Sexual Activity   Alcohol use: No    Comment: occasionally   Drug use: Yes    Types: Methamphetamines, Heroin, Marijuana, Fentanyl    Comment: last dose yesterday   Sexual activity: Never  Other Topics Concern   Not on file  Social History Narrative   Not on file   Social Determinants of Health   Financial Resource Strain: Not on file  Food Insecurity: No Food Insecurity (08/10/2022)   Hunger Vital Sign    Worried About Running Out of Food in the Last Year: Never true    Ran Out of Food in the Last Year: Never true  Transportation Needs: No Transportation Needs (08/10/2022)   PRAPARE -  Administrator, Civil Service (Medical): No    Lack of Transportation (Non-Medical): No  Physical Activity: Not on file  Stress: Not on file  Social Connections: Not on file    Review of Systems: A 12 point ROS discussed and pertinent positives are indicated in the HPI above.  All other systems are negative.  Review of Systems  All other systems reviewed and are negative.   Vital Signs: BP 136/67   Pulse 74   Temp 98.4 F (36.9 C) (Oral)   Resp 16   SpO2 94%     Physical Exam Vitals reviewed.  Constitutional:      General: He is not in acute  distress.    Appearance: Normal appearance. He is not ill-appearing.  HENT:     Head: Normocephalic and atraumatic.     Mouth/Throat:     Pharynx: Oropharynx is clear.  Eyes:     Extraocular Movements: Extraocular movements intact.     Pupils: Pupils are equal, round, and reactive to light.  Cardiovascular:     Rate and Rhythm: Normal rate and regular rhythm.     Pulses: Normal pulses.     Heart sounds: Normal heart sounds. No murmur heard. Pulmonary:     Effort: Pulmonary effort is normal. No respiratory distress.     Breath sounds: Normal breath sounds.  Abdominal:     General: Bowel sounds are normal. There is no distension.     Palpations: Abdomen is soft.     Tenderness: There is no abdominal tenderness. There is no guarding.  Musculoskeletal:     Right lower leg: No edema.     Left lower leg: No edema.  Skin:    General: Skin is warm and dry.  Neurological:     Mental Status: He is alert and oriented to person, place, and time.  Psychiatric:        Mood and Affect: Mood normal.        Behavior: Behavior normal.        Thought Content: Thought content normal.        Judgment: Judgment normal.     Imaging: No results found.  Labs:  CBC: Recent Labs    10/03/22 0340 10/17/22 1133 12/04/22 1600 12/25/22 1352  WBC 15.3* 13.2* 15.4* 12.6*  HGB 14.5 14.9 15.5 15.2  HCT 44.9 46.0 49.1 48.0  PLT 588* 569* 677* 619*    COAGS: No results for input(s): "INR", "APTT" in the last 8760 hours.  BMP: Recent Labs    08/13/22 0624 08/16/22 0952 08/19/22 0740 09/21/22 1045 10/03/22 0340 10/17/22 1133 12/25/22 1352  NA 137 138 138 139 137 140 138  K 3.9 4.1 4.0 4.1 3.9 4.3 4.2  CL 103 102 104 107 103 105 104  CO2 26 25 26 22 23 26 26   GLUCOSE 97 161* 91 119* 69 71 85  BUN 5* 10 6 10 8 8 7   CALCIUM 8.8* 8.8* 8.7* 8.6 9.1 9.4 9.9  CREATININE 0.71 0.71 0.60* 0.67 0.69 0.69 0.85  GFRNONAA >60 >60 >60  --   --   --  >60    LIVER FUNCTION TESTS: Recent Labs     07/30/22 2220 08/19/22 0740 09/21/22 1045 10/03/22 0340 10/17/22 1133 12/25/22 1352  BILITOT 0.6 0.4 0.2 0.4 0.4 0.5  AST 18 24 11 11 13  13*  ALT 22 28 14 14 20 16   ALKPHOS 64 51  --   --   --  63  PROT 8.0 6.9 6.7 7.6 7.6 8.4*  ALBUMIN 3.8 3.1*  --   --   --  4.3    TUMOR MARKERS: No results for input(s): "AFPTM", "CEA", "CA199", "CHROMGRNA" in the last 8760 hours.  Assessment and Plan:  27 year old male with PMH X significant for IV drug use, right craniotomy, osteomyelitis of the skull, bipolar disorder, leukocytosis and thrombocytosis presents to IR for bone marrow biopsy and aspiration with moderate sedation.  Patient resting in bed with family at bedside. He is alert and oriented, calm and pleasant.   He is in no distress.  Risks and benefits of bone marrow biopsy and aspiration with moderate sedation was discussed with the patient and/or patient's family including, but not limited to bleeding, infection, damage to adjacent structures or low yield requiring additional tests.  All of the questions were answered and there is agreement to proceed.  Consent signed and in chart.  Thank you for this interesting consult.  I greatly enjoyed meeting Quientin Elobeid Jacai and look forward to participating in their care.  A copy of this report was sent to the requesting provider on this date.  Electronically Signed: Shon Hough, NP 01/16/2023, 10:22 AM   I spent a total of 20 minutes in face to face in clinical consultation, greater than 50% of which was counseling/coordinating care for abnormal labs.

## 2023-01-14 ENCOUNTER — Other Ambulatory Visit: Payer: Self-pay | Admitting: Radiology

## 2023-01-14 DIAGNOSIS — D75839 Thrombocytosis, unspecified: Secondary | ICD-10-CM

## 2023-01-16 ENCOUNTER — Ambulatory Visit (HOSPITAL_COMMUNITY)
Admission: RE | Admit: 2023-01-16 | Discharge: 2023-01-16 | Disposition: A | Discharge status: Home / Self Care | Payer: BLUE CROSS/BLUE SHIELD | Source: Ambulatory Visit | Attending: Hematology and Oncology | Admitting: Hematology and Oncology

## 2023-01-16 ENCOUNTER — Encounter (HOSPITAL_COMMUNITY): Payer: Self-pay

## 2023-01-16 ENCOUNTER — Other Ambulatory Visit: Payer: Self-pay

## 2023-01-16 DIAGNOSIS — Q759 Congenital malformation of skull and face bones, unspecified: Secondary | ICD-10-CM | POA: Insufficient documentation

## 2023-01-16 DIAGNOSIS — D7219 Other eosinophilia: Secondary | ICD-10-CM | POA: Insufficient documentation

## 2023-01-16 DIAGNOSIS — D75839 Thrombocytosis, unspecified: Secondary | ICD-10-CM

## 2023-01-16 LAB — CBC WITH DIFFERENTIAL/PLATELET
Abs Immature Granulocytes: 0.09 10*3/uL — ABNORMAL HIGH (ref 0.00–0.07)
Basophils Absolute: 0.1 10*3/uL (ref 0.0–0.1)
Basophils Relative: 1 %
Eosinophils Absolute: 3.3 10*3/uL — ABNORMAL HIGH (ref 0.0–0.5)
Eosinophils Relative: 22 %
HCT: 46.4 % (ref 39.0–52.0)
Hemoglobin: 14.1 g/dL (ref 13.0–17.0)
Immature Granulocytes: 1 %
Lymphocytes Relative: 29 %
Lymphs Abs: 4.4 10*3/uL — ABNORMAL HIGH (ref 0.7–4.0)
MCH: 24.4 pg — ABNORMAL LOW (ref 26.0–34.0)
MCHC: 30.4 g/dL (ref 30.0–36.0)
MCV: 80.1 fL (ref 80.0–100.0)
Monocytes Absolute: 0.6 10*3/uL (ref 0.1–1.0)
Monocytes Relative: 4 %
Neutro Abs: 6.6 10*3/uL (ref 1.7–7.7)
Neutrophils Relative %: 43 %
Platelets: 595 10*3/uL — ABNORMAL HIGH (ref 150–400)
RBC: 5.79 MIL/uL (ref 4.22–5.81)
RDW: 17.2 % — ABNORMAL HIGH (ref 11.5–15.5)
WBC Morphology: >10
WBC: 15.1 10*3/uL — ABNORMAL HIGH (ref 4.0–10.5)
nRBC: 0 % (ref 0.0–0.2)

## 2023-01-16 MED ORDER — MIDAZOLAM HCL 2 MG/2ML IJ SOLN
INTRAMUSCULAR | Status: AC | PRN
  Administered 2023-01-16 (×4): 1 mg via INTRAVENOUS

## 2023-01-16 MED ORDER — NALOXONE HCL 0.4 MG/ML IJ SOLN
INTRAMUSCULAR | Status: AC
  Filled 2023-01-16: qty 1

## 2023-01-16 MED ORDER — LIDOCAINE HCL (PF) 1 % IJ SOLN
INTRAMUSCULAR | Status: AC | PRN
  Administered 2023-01-16: 10 mL

## 2023-01-16 MED ORDER — FLUMAZENIL 0.5 MG/5ML IV SOLN
INTRAVENOUS | Status: AC
  Filled 2023-01-16: qty 5

## 2023-01-16 MED ORDER — FENTANYL CITRATE (PF) 100 MCG/2ML IJ SOLN
INTRAMUSCULAR | Status: AC | PRN
  Administered 2023-01-16 (×4): 50 ug via INTRAVENOUS

## 2023-01-16 MED ORDER — FENTANYL CITRATE (PF) 100 MCG/2ML IJ SOLN
INTRAMUSCULAR | Status: AC
  Filled 2023-01-16: qty 4

## 2023-01-16 MED ORDER — SODIUM CHLORIDE 0.9 % IV SOLN: INTRAVENOUS | Status: DC

## 2023-01-16 MED ORDER — MIDAZOLAM HCL 2 MG/2ML IJ SOLN
INTRAMUSCULAR | Status: AC
  Filled 2023-01-16: qty 4

## 2023-01-16 NOTE — Discharge Instructions (Signed)
Discharge Instructions:   Please call Interventional Radiology clinic [phone] with any questions or concerns.  You may remove your dressing and shower tomorrow.   Moderate Conscious Sedation, Adult, Care After This sheet gives you information about how to care for yourself after your procedure. Your health care provider may also give you more specific instructions. If you have problems or questions, contact your health care provider. What can I expect after the procedure? After the procedure, it is common to have: Sleepiness for several hours. Impaired judgment for several hours. Difficulty with balance. Vomiting if you eat too soon. Follow these instructions at home: For the time period you were told by your health care provider: Rest. Do not participate in activities where you could fall or become injured. Do not drive or use machinery. Do not drink alcohol. Do not take sleeping pills or medicines that cause drowsiness. Do not make important decisions or sign legal documents. Do not take care of children on your own. Eating and drinking  Follow the diet recommended by your health care provider. Drink enough fluid to keep your urine pale yellow. If you vomit: Drink water, juice, or soup when you can drink without vomiting. Make sure you have little or no nausea before eating solid foods. General instructions Take over-the-counter and prescription medicines only as told by your health care provider. Have a responsible adult stay with you for the time you are told. It is important to have someone help care for you until you are awake and alert. Do not smoke. Keep all follow-up visits as told by your health care provider. This is important. Contact a health care provider if: You are still sleepy or having trouble with balance after 24 hours. You feel light-headed. You keep feeling nauseous or you keep vomiting. You develop a rash. You have a fever. You have redness or  swelling around the IV site. Get help right away if: You have trouble breathing. You have new-onset confusion at home. Summary After the procedure, it is common to feel sleepy, have impaired judgment, or feel nauseous if you eat too soon. Rest after you get home. Know the things you should not do after the procedure. Follow the diet recommended by your health care provider and drink enough fluid to keep your urine pale yellow. Get help right away if you have trouble breathing or new-onset confusion at home. This information is not intended to replace advice given to you by your health care provider. Make sure you discuss any questions you have with your health care provider. Document Revised: 01/02/2020 Document Reviewed: 07/31/2019 Elsevier Patient Education  2023 [employer]   Bone Marrow Aspiration and Bone Marrow Biopsy, Adult, Care After This sheet gives you information about how to care for yourself after your procedure. Your health care provider may also give you more specific instructions. If you have problems or questions, contact your health care provider. What can I expect after the procedure? After the procedure, it is common to have: Mild pain and tenderness. Swelling. Bruising. Follow these instructions at home: Puncture site care  Follow instructions from your health care provider about how to take care of the puncture site. Make sure you: Wash your hands with soap and water before and after you change your bandage (dressing). If soap and water are not available, use hand sanitizer. Change your dressing as told by your health care provider. Check your puncture site every day for signs of infection. Check for: More redness, swelling, or pain.   Fluid or blood. Warmth. Pus or a bad smell. Activity Return to your normal activities as told by your health care provider. Ask your health care provider what activities are safe for you. Do not lift anything that is heavier  than 10 lb (4.5 kg), or the limit that you are told, until your health care provider says that it is safe. Do not drive for 24 hours if you were given a sedative during your procedure. General instructions  Take over-the-counter and prescription medicines only as told by your health care provider. Do not take baths, swim, or use a hot tub until your health care provider approves. Ask your health care provider if you may take showers. You may only be allowed to take sponge baths. If directed, put ice on the affected area. To do this: Put ice in a plastic bag. Place a towel between your skin and the bag. Leave the ice on for 20 minutes, 2-3 times a day. Keep all follow-up visits as told by your health care provider. This is important. Contact a health care provider if: Your pain is not controlled with medicine. You have a fever. You have more redness, swelling, or pain around the puncture site. You have fluid or blood coming from the puncture site. Your puncture site feels warm to the touch. You have pus or a bad smell coming from the puncture site. Summary After the procedure, it is common to have mild pain, tenderness, swelling, and bruising. Follow instructions from your health care provider about how to take care of the puncture site and what activities are safe for you. Take over-the-counter and prescription medicines only as told by your health care provider. Contact a health care provider if you have any signs of infection, such as fluid or blood coming from the puncture site. This information is not intended to replace advice given to you by your health care provider. Make sure you discuss any questions you have with your health care provider. Document Revised: 01/21/2019 Document Reviewed: 01/21/2019 Elsevier Patient Education  2023 [employer] 

## 2023-01-16 NOTE — Sedation Documentation (Signed)
Sample #2 obtained 

## 2023-01-16 NOTE — Sedation Documentation (Signed)
Sample #1 obtained 

## 2023-01-16 NOTE — Sedation Documentation (Signed)
Sample #3 obtained 

## 2023-01-16 NOTE — Procedures (Signed)
Interventional Radiology Procedure Note  Procedure: CT guided bone marrow aspiration and biopsy  Complications: None  EBL: < 10 mL  Findings: Aspirate and core biopsy performed of bone marrow in right iliac bone.  Plan: Bedrest supine x 1 hrs  Rakeb Kibble T. Marjo Grosvenor, M.D Pager:  [phone]   

## 2023-01-17 LAB — SURGICAL PATHOLOGY

## 2023-01-24 ENCOUNTER — Encounter (HOSPITAL_COMMUNITY): Payer: Self-pay | Admitting: Hematology and Oncology

## 2023-01-25 ENCOUNTER — Telehealth: Payer: Self-pay | Admitting: *Deleted

## 2023-01-25 NOTE — Telephone Encounter (Signed)
Notified of message below

## 2023-01-25 NOTE — Telephone Encounter (Signed)
-----   Message from Devin Searles, RN sent at 01/24/2023  4:54 PM EDT ----- Regarding: call pt Devin Barker,  Please call patient and let hiom know his bone marrow biopsy did not show anything critical. We will have Dr. Leonides Barker review again when he is back in town.  This is per Devin Barker.  Thanks. Devin Rail, RN

## 2023-02-01 ENCOUNTER — Telehealth (HOSPITAL_COMMUNITY): Payer: Self-pay | Admitting: *Deleted

## 2023-02-01 DIAGNOSIS — F319 Bipolar disorder, unspecified: Secondary | ICD-10-CM

## 2023-02-01 DIAGNOSIS — F1910 Other psychoactive substance abuse, uncomplicated: Secondary | ICD-10-CM

## 2023-02-01 DIAGNOSIS — F902 Attention-deficit hyperactivity disorder, combined type: Secondary | ICD-10-CM

## 2023-02-01 DIAGNOSIS — F411 Generalized anxiety disorder: Secondary | ICD-10-CM

## 2023-02-01 MED ORDER — GABAPENTIN 100 MG PO CAPS: ORAL_CAPSULE | ORAL | 0 refills | Status: DC

## 2023-02-01 NOTE — Addendum Note (Signed)
Addended by: Kathryne Sharper T on: 02/01/2023 04:04 PM   Modules accepted: Orders

## 2023-02-01 NOTE — Telephone Encounter (Signed)
Rx Refill Request:: [id number] - , [place] - [address] AT [facility] OF [address]  **LAST FILL DATE:; 01/12/2023  gabapentin (NEURONTIN) 100 MG capsule [[id number]]   Order Details Dose, Route, Frequency: As Directed  Dispense Quantity: 90 capsule Refills: 1        Sig: Take one capsule twice a day and two capsule at bed time.       Start Date: 12/05/22 End Date: --  Written Date: 12/05/22 Expiration Date: 12/05/23

## 2023-02-01 NOTE — Telephone Encounter (Signed)
Send a 30-day supply to the pharmacy.

## 2023-02-05 ENCOUNTER — Telehealth (HOSPITAL_BASED_OUTPATIENT_CLINIC_OR_DEPARTMENT_OTHER): Payer: BLUE CROSS/BLUE SHIELD | Admitting: Psychiatry

## 2023-02-05 ENCOUNTER — Encounter (HOSPITAL_COMMUNITY): Payer: Self-pay | Admitting: Psychiatry

## 2023-02-05 VITALS — Wt 273.0 lb

## 2023-02-05 DIAGNOSIS — F902 Attention-deficit hyperactivity disorder, combined type: Secondary | ICD-10-CM

## 2023-02-05 DIAGNOSIS — F1910 Other psychoactive substance abuse, uncomplicated: Secondary | ICD-10-CM

## 2023-02-05 DIAGNOSIS — F319 Bipolar disorder, unspecified: Secondary | ICD-10-CM

## 2023-02-05 DIAGNOSIS — F411 Generalized anxiety disorder: Secondary | ICD-10-CM | POA: Diagnosis not present

## 2023-02-05 MED ORDER — ARIPIPRAZOLE 5 MG PO TABS: 5.0000 mg | ORAL_TABLET | Freq: Every day | ORAL | 2 refills | Status: DC

## 2023-02-05 MED ORDER — GABAPENTIN 100 MG PO CAPS
100.0000 mg | ORAL_CAPSULE | Freq: Four times a day (QID) | ORAL | 2 refills | Status: DC
Start: 2023-02-05 — End: 2023-05-23

## 2023-02-05 MED ORDER — HYDROXYZINE HCL 10 MG PO TABS
10.0000 mg | ORAL_TABLET | Freq: Two times a day (BID) | ORAL | 1 refills | Status: DC | PRN
Start: 2023-02-05 — End: 2023-10-26

## 2023-02-05 NOTE — Progress Notes (Signed)
[facility] MD Virtual Progress Note   Patient Location: In Car Provider Location: Home Office  I connect with patient by video and verified that I am speaking with correct person by using two identifiers. I discussed the limitations of evaluation and management by telemedicine and the availability of in person appointments. I also discussed with the patient that there may be a patient responsible charge related to this service. The patient expressed understanding and agreed to proceed.  Devin Barker [id number] 27 y.o.  02/05/2023 3:37 PM  History of Present Illness:  Patient is evaluated by video session.  He has been doing better with the gabapentin but sometimes he takes the gabapentin 4 times a day.  He noted his sleep is good but struggle with memory, attention, concentration and forgetfulness.  He recently has a bone marrow biopsy but has not discuss the results with the doctor.  He has high WBC count.  He is not seeing hematology.  He denies any irritability, anger, mania, impulsive behavior.  He is pleased that job is going very well.  He had a interview for a job promotion but he did not get the promotion.  He is okay with that decision but he understandingly to work hard.  He is getting along with his family member.  He is excited about his relationship and planned to get married in December.  He is compliant with Abilify, hydroxyzine.  He takes hydroxyzine sometimes during the day which keep him calm.  He feels proud that he is not drinking or using any drugs or illegal substances.  He has no tremors, shakes or any EPS.  Patient lives with his parents and he is the oldest sibling.  He reported family is happy with his progress.  He like to keep this current medication.  Past Psychiatric History: History of poor impulse control, highs and lows, paranoia, heavy drug use.  History of using cocaine, IVDA, crystal meth and multiple rehab.  Diagnosed with ADHD  and given Adderall by [place] psychiatrist [place].  History of overdose in 2019 when he was living in sedan. Given fluoxetine, amitriptyline and Tegretol overseas but stopped the medicine in [place] when it did not work.    Outpatient Encounter Medications as of 02/05/2023  Medication Sig   ARIPiprazole (ABILIFY) 5 MG tablet Take 1 tablet (5 mg total) by mouth daily.   gabapentin (NEURONTIN) 100 MG capsule Take one capsule twice a day and two capsule at bed time.   hydrOXYzine (ATARAX) 10 MG tablet Take 1 tablet (10 mg total) by mouth 2 (two) times daily as needed for anxiety.   No facility-administered encounter medications on file as of 02/05/2023.    Recent Results (from the past 2160 hour(s))  CBC with Differential/Platelet     Status: Abnormal   Collection Time: 12/04/22  4:00 PM  Result Value Ref Range   WBC 15.4 (H) 3.8 - 10.8 Thousand/uL   RBC 6.20 (H) 4.20 - 5.80 Million/uL   Hemoglobin 15.5 13.2 - 17.1 g/dL   HCT 40.9 81.1 - 91.4 %   MCV 79.2 (L) 80.0 - 100.0 fL   MCH 25.0 (L) 27.0 - 33.0 pg   MCHC 31.6 (L) 32.0 - 36.0 g/dL   RDW 78.2 95.6 - 21.3 %   Platelets 677 (H) 140 - 400 Thousand/uL   MPV 9.3 7.5 - 12.5 fL   Neutro Abs 6,930 1,500 - 7,800 cells/uL   Lymphs Abs 5,313 (H) 850 - 3,900  cells/uL   Absolute Monocytes 616 200 - 950 cells/uL   Eosinophils Absolute 2,418 (H) 15 - 500 cells/uL   Basophils Absolute 123 0 - 200 cells/uL   Neutrophils Relative % 45 %   Total Lymphocyte 34.5 %   Monocytes Relative 4.0 %   Eosinophils Relative 15.7 %   Basophils Relative 0.8 %  Surgical pathology     Status: None   Collection Time: 12/25/22 12:00 AM  Result Value Ref Range   SURGICAL PATHOLOGY      Surgical Pathology CASE: [id number] PATIENT: Devin Barker Flow Pathology Report     Clinical history: lymphocytosis     DIAGNOSIS:  Peripheral blood, flow cytometry: -  No immunophenotypic evidence of a lymphoproliferative disorder  (no monoclonal B cells or immunophenotypically abnormal T cells detected).  GATING AND PHENOTYPIC ANALYSIS:  Gated population: Flow cytometric immunophenotyping is performed using antibodies to the antigens listed in the table below. Electronic gates are placed around a cell cluster displaying light scatter properties corresponding to: lymphocytes  Abnormal Cells in gated population: N/A  Phenotype of Abnormal Cells: N/A                       Lymphoid Antigens       Myeloid Antigens Miscellaneous CD2  tested    CD10 tested    CD11b     ND   CD45 tested CD3  tested    CD19 tested    CD11c     ND   HLA-Dr    ND CD4  tested    CD20 tested    CD13 ND   CD34 tested CD5  tested    CD22 ND   CD14 ND   CD38 tested  CD7  tested    CD79b     ND   CD15 ND   CD138     ND CD8  tested    CD103     ND   CD16 ND   TdT  ND CD25 ND   CD200     tested    CD33 ND   CD123     ND TCRab     ND   sKappa    tested    CD64 ND   CD41 ND TCRgd     tested    sLambda   tested    CD117     ND   CD61 ND CD56 tested    cKappa    ND   MPO  ND   CD71 ND CD57 ND   cLambda   ND        CD235aND       GROSS DESCRIPTION:  One lavender top tube from [facility] submitted for lymphoma testing.    Final Diagnosis performed by Orene Desanctis DO.   Electronically signed 12/29/2022 Technical and / or Professional components performed at [facility], [address] [place], [place] [address].  The above tests were developed and their performance characteristics determined by the [facility] system for the physical and immunophenotypic characterization of cell populations. They have not been cleared by the U.S. Food and Drug administration. The  FDA has determined that such clearance or approval i s not necessary. This test is used for clinical purposes. It should not be  regarded as investigational or for research   Strongyloides, Ab, IgG     Status: None   Collection Time: 12/25/22  1:26 PM   Result Value Ref Range  Strongyloides, Ab, IgG Negative Negative    Comment: (NOTE) Performed At: [facility] [address] [place], [place] [id number] Jolene Schimke MD WU:[id number]   BCR ABL1 FISH (GenPath)     Status: None   Collection Time: 12/25/22  1:52 PM  Result Value Ref Range   BCR ABL1 / ABL1 SEE SEPARATE REPORT     Comment: Performed at [facility], [address] [place], [place] [address]  JAK2 (including V617F and Exon 12), MPL, and CALR-Next Generation Sequencing     Status: None   Collection Time: 12/25/22  1:52 PM  Result Value Ref Range   JAK 2, MPL, CALR SEE SEPARATE REPORT     Comment: Performed at [facility], [address] [place], [place] [address]  C-reactive protein     Status: Abnormal   Collection Time: 12/25/22  1:52 PM  Result Value Ref Range   CRP 2.0 (H) <1.0 mg/dL    Comment: Performed at [facility], [address], [place], [place] [address]  Sedimentation rate     Status: None   Collection Time: 12/25/22  1:52 PM  Result Value Ref Range   Sed Rate 12 0 - 16 mm/hr    Comment: Performed at [facility], [address], [place], [place] [address]  Lactate dehydrogenase (LDH)     Status: None   Collection Time: 12/25/22  1:52 PM  Result Value Ref Range   LDH 185 98 - 192 U/L    Comment: Performed at [facility], [address], [place], [place] [address]  Flow Cytometry, Peripheral Blood (Oncology)     Status: None   Collection Time: 12/25/22  1:52 PM  Result Value Ref Range   Flow Cytometry SEE SEPARATE REPORT     Comment: Performed at [facility], [address] [place], [place] [address]  CMP (Cancer Center only)     Status: Abnormal   Collection Time: 12/25/22  1:52 PM  Result Value Ref Range   Sodium 138 135 - 145 mmol/L   Potassium 4.2 3.5 - 5.1 mmol/L   Chloride 104 98 - 111 mmol/L   CO2 26  22 - 32 mmol/L   Glucose, Bld 85 70 - 99 mg/dL    Comment: Glucose reference range applies only to samples taken after fasting for at least 8 hours.   BUN 7 6 - 20 mg/dL   Creatinine 2.72 5.36 - 1.24 mg/dL   Calcium 9.9 8.9 - 64.4 mg/dL   Total Protein 8.4 (H) 6.5 - 8.1 g/dL   Albumin 4.3 3.5 - 5.0 g/dL   AST 13 (L) 15 - 41 U/L   ALT 16 0 - 44 U/L   Alkaline Phosphatase 63 38 - 126 U/L   Total Bilirubin 0.5 0.3 - 1.2 mg/dL   GFR, Estimated >03 >47 mL/min    Comment: (NOTE) Calculated using the CKD-EPI Creatinine Equation (2021)    Anion gap 8 5 - 15    Comment: Performed at [facility], [address] [place], [place] [address]  CBC with Differential (Cancer Center Only)     Status: Abnormal   Collection Time: 12/25/22  1:52 PM  Result Value Ref Range   WBC Count 12.6 (H) 4.0 - 10.5 K/uL   RBC 6.06 (H) 4.22 - 5.81 MIL/uL   Hemoglobin 15.2 13.0 - 17.0 g/dL   HCT 63.8 75.6 - 43.3 %  MCV 79.2 (L) 80.0 - 100.0 fL   MCH 25.1 (L) 26.0 - 34.0 pg   MCHC 31.7 30.0 - 36.0 g/dL   RDW 16.1 (H) 09.6 - 04.5 %   Platelet Count 619 (H) 150 - 400 K/uL   nRBC 0.0 0.0 - 0.2 %   Neutrophils Relative % 35 %   Neutro Abs 4.4 1.7 - 7.7 K/uL   Lymphocytes Relative 36 %   Lymphs Abs 4.6 (H) 0.7 - 4.0 K/uL   Monocytes Relative 3 %   Monocytes Absolute 0.4 0.1 - 1.0 K/uL   Eosinophils Relative 24 %   Eosinophils Absolute 3.0 (H) 0.0 - 0.5 K/uL   Basophils Relative 1 %   Basophils Absolute 0.2 (H) 0.0 - 0.1 K/uL   Immature Granulocytes 1 %   Abs Immature Granulocytes 0.07 0.00 - 0.07 K/uL    Comment: Performed at [facility], [address] [place], [place] [address]  Vitamin B12     Status: None   Collection Time: 12/25/22  1:52 PM  Result Value Ref Range   Vitamin B-12 227 180 - 914 pg/mL    Comment: (NOTE) This assay is not validated for testing neonatal or myeloproliferative syndrome specimens for Vitamin B12 levels. Performed at  [facility], [address] [place], [place] [address]   Tryptase     Status: None   Collection Time: 12/25/22  1:52 PM  Result Value Ref Range   Tryptase 5.5 2.2 - 13.2 ug/L    Comment: (NOTE) Performed At: [facility] [address] [place], [place] [id number] Jolene Schimke MD QM:[id number]   Iron and Iron Binding Capacity ([facility] only)     Status: Abnormal   Collection Time: 12/25/22  1:52 PM  Result Value Ref Range   Iron 30 (L) 45 - 182 ug/dL   TIBC 284 132 - 440 ug/dL   Saturation Ratios 7 (L) 17.9 - 39.5 %   UIBC 396 (H) 117 - 376 ug/dL    Comment: Performed at [facility], [address], [place], [place] [address]  Ferritin     Status: None   Collection Time: 12/25/22  1:52 PM  Result Value Ref Range   Ferritin 32 24 - 336 ng/mL    Comment: Performed at [facility], [address] [place], [place] [address]  PDGFRA     Status: None   Collection Time: 12/25/22  1:52 PM  Result Value Ref Range   PDGFRA SEE SEPARATE REPORT     Comment: Performed at [facility], [address] [place], [place] [address]  Miscellaneous test (send-out)     Status: None   Collection Time: 12/25/22  1:52 PM  Result Value Ref Range   Miscellaneous Test FGFR1 GENPATH TC B557 0    Miscellaneous Test Results SEE SEPARATE REPORT     Comment: Performed at [facility], [address] [place], [place] [address]  Miscellaneous test (send-out)     Status: None   Collection Time: 12/25/22  1:52 PM  Result Value Ref Range   Miscellaneous Test PDGFRB GENPATH TC T781 4    Miscellaneous Test Results SEE SEPARATE REPORT     Comment: Performed at [facility], [address], [place], [place] [address]  Surgical pathology     Status: None   Collection Time: 01/16/23 12:00 AM  Result Value Ref Range   SURGICAL PATHOLOGY  Surgical  Pathology CASE: [id number] PATIENT: Devin Barker Bone Marrow Report     Clinical History: Thrombocytosis (BH)     DIAGNOSIS:  BONE MARROW, ASPIRATE, CLOT, CORE: -Normocellular bone marrow for age with megakaryocytic proliferation and slight eosinophilia -See comment  PERIPHERAL BLOOD: -Leukocytosis with lymphocytosis and eosinophilia -Thrombocytosis  COMMENT:  The bone marrow is normocellular for age with trilineage hematopoiesis but with increased number of megakaryocytes in addition to slight eosinophilia.  No increase in blastic cells identified and there is no evidence of a lymphoproliferative process.  The findings are not considered specific and are possibly secondary in nature especially in the setting of previously negative molecular and FISH studies. Nonetheless, correlation with cytogenetic studies is recommended.  MICROSCOPIC DESCRIPTION:  PERIPHERAL BLOOD SMEAR: The red blood cells display mild anisopoikilocytosis  with mild to moderate polychromasia.  The white blood cells are increased in number with lymphocytosis and eosinophilia. The lymphocytes primarily consist of small lymphoid cells admixed with large granular lymphocytes.  Eosinophilic cells generally show normal lobation and granulation.  The platelets are increased in number.  BONE MARROW ASPIRATE: Bone marrow particles present Erythroid precursors: Orderly and progressive maturation Granulocytic precursors: There is slight increase in eosinophilic cells including myelocytes and maturing eosinophils with normal lobation and granulation.  Neutrophilic cells display orderly and progressive maturation.  No increase in blastic cells identified. Megakaryocytes: Increased in number with scattered large and/or small hypolobated forms Lymphocytes/plasma cells: Large aggregates not present  TOUCH PREPARATIONS: A mixture of cell types present  CLOT AND BIOPSY: The sections show 60 to  70% cellularity with a mixture of cell type s but with increased number of megakaryocytes including scattered hyperchromatic forms.  Significant lymphoid aggregates or granulomata are not present.  IRON STAIN: Iron stains are performed on a bone marrow aspirate or touch imprint smear and section of clot. The controls stained appropriately.       Storage Iron: Present      Ring Sideroblasts: Absent  ADDITIONAL DATA/TESTING: The specimen was sent for cytogenetic analysis and a separate report will follow.  Flow cytometric analysis was performed [facility] [phone]) and failed to show any significant CD34 positive blastic population, monoclonal B-cell population, or significant T-cell abnormalities.  CELL COUNT DATA:  Bone Marrow count performed on 500 cells shows: Blasts:   1%   Myeloid:  63% Promyelocytes: 0%   Erythroid:     28% Myelocytes:    10%  Lymphocytes:   5% Metamyelocytes:     4%   Plasma cells:  3% Bands:    12% Neutrophils:   27%  M:E ratio:     2.3 Eosinophils:   9% Basophils:     0% Monocytes:      1%  Lab Data: CBC performed on 01/16/2023 shows: WBC: 15.1 k/uL Neutrophils:   38% Hgb: 14.1 g/dL Lymphocytes:   91% HCT: 46.4 %    Monocytes:     4% MCV: 80.1 fL   Eosinophils:   17% RDW: 17.2 %    Basophils:     1% PLT: 595 k/uL    GROSS DESCRIPTION:  A: Aspirate smear  B: The specimen is received in B plus fixative, and consists of a 12.0 x 6.0 x 4.0 mm aggregate of red-brown clotted blood.  The specimen is entirely submitted in 1 cassette.  C: The specimen is received in B plus fixative, and consists of 2 cores of red-brown bone, measuring 0.6 and 0.7 cm in length by 0.2 cm in diameter.  The  specimen is entirely submitted in 1 cassette following decalcification with Immunocal.  Lovey Newcomer 01/16/2023)   Final Diagnosis performed by Guerry Bruin, MD.   Electronically signed 01/17/2023 Technical and / or Professional components performed at [facility], [address], [place], [place] [address].  Immunohistochemistry Technical component (if app licable) was performed at [facility]. [address], [place], [place] [address].   IMMUNOHISTOCHEMISTRY DISCLAIMER (if applicable): Some of these immunohistochemical stains may have been developed and the performance characteristics determine by [facility]. Some may not have been cleared or approved by the U.S. Food and Drug Administration. The FDA has determined that such clearance or approval is not necessary. This test is used for clinical purposes. It should not be regarded as investigational or for research. This laboratory is certified under the Clinical Laboratory Improvement Amendments of 1988 (CLIA-88) as qualified to perform high complexity clinical laboratory testing.  The controls stained appropriately.   Surgical pathology     Status: None   Collection Time: 01/16/23 12:00 AM  Result Value Ref Range   SURGICAL PATHOLOGY      Surgical Pathology CASE: [id number] PATIENT: Devin Barker Flow Pathology Report     Clinical history: leukocytosis, eosinophilia, thrombocytosis     DIAGNOSIS:  -No significant CD34 positive blastic population identified -No monoclonal B-cell population or significant T-cell abnormalities identified.   GATING AND PHENOTYPIC ANALYSIS:  Gated population: Flow cytometric immunophenotyping is performed using antibodies to the antigens listed in the table below. Electronic gates are placed around a cell cluster displaying light scatter properties corresponding to: lymphocytes, blasts  Abnormal Cells in gated population: N/A  Phenotype of Abnormal Cells: N/A                        Lymphoid Antigens       Myeloid Antigens Miscellaneous CD2  tested    CD10 tested    CD11b     ND   CD45 tested CD3  tested    CD19 tested    CD11c     ND   HLA-Dr    ND CD4  tested    CD20 tested     CD13 ND   CD34 tested CD5  tested    CD22 ND   CD14 ND   CD38 t ested CD7  tested    CD79b     ND   CD15 ND   CD138     ND CD8  tested    CD103     ND   CD16 ND   TdT  ND CD25 ND   CD200     tested    CD33 ND   CD123     ND TCRab     ND   sKappa    tested    CD64 ND   CD41 ND TCRgd     tested    sLambda   tested    CD117     ND   CD61 ND CD56 tested    cKappa    ND   MPO  ND   CD71 ND CD57 ND   cLambda   ND        CD235aND      GROSS DESCRIPTION:  Reference Bone Marrow case [id number].    Final Diagnosis performed by Guerry Bruin, MD.   Electronically signed 01/17/2023 Technical and / or Professional components performed at [facility], [address] [place],  [place] [address].  The above tests were developed and their performance characteristics determined by the [facility] system for the physical and immunophenotypic characterization of cell populations. They have not been cleared by the U.S. Food and Drug administration. The  FDA has determined that such clearance or approval is not necessary. This  test is used for clinical purposes. It should not be  regarded as investigational or for research   CBC with Differential/Platelet     Status: Abnormal   Collection Time: 01/16/23  9:45 AM  Result Value Ref Range   WBC 15.1 (H) 4.0 - 10.5 K/uL   RBC 5.79 4.22 - 5.81 MIL/uL   Hemoglobin 14.1 13.0 - 17.0 g/dL   HCT 47.8 29.5 - 62.1 %   MCV 80.1 80.0 - 100.0 fL   MCH 24.4 (L) 26.0 - 34.0 pg   MCHC 30.4 30.0 - 36.0 g/dL   RDW 30.8 (H) 65.7 - 84.6 %   Platelets 595 (H) 150 - 400 K/uL   nRBC 0.0 0.0 - 0.2 %   Neutrophils Relative % 43 %   Neutro Abs 6.6 1.7 - 7.7 K/uL   Lymphocytes Relative 29 %   Lymphs Abs 4.4 (H) 0.7 - 4.0 K/uL   Monocytes Relative 4 %   Monocytes Absolute 0.6 0.1 - 1.0 K/uL   Eosinophils Relative 22 %   Eosinophils Absolute 3.3 (H) 0.0 - 0.5 K/uL   Basophils Relative 1 %   Basophils Absolute 0.1 0.0 - 0.1 K/uL   WBC Morphology  >10% Reactive Benign Lymphoctyes    Immature Granulocytes 1 %   Abs Immature Granulocytes 0.09 (H) 0.00 - 0.07 K/uL    Comment: Performed at [facility], [address] [place], [place] [address]     Psychiatric Specialty Exam: Physical Exam  Review of Systems  There were no vitals taken for this visit.There is no height or weight on file to calculate BMI.  General Appearance: Casual  Eye Contact:  Fair  Speech:  Normal Rate  Volume:  Decreased  Mood:  Euthymic  Affect:  Congruent  Thought Process:  Descriptions of Associations: Intact  Orientation:  Full (Time, Place, and Person)  Thought Content:  Rumination  Suicidal Thoughts:  No  Homicidal Thoughts:  No  Memory:  Immediate;   Good Recent;   Good Remote;   Fair  Judgement:  Intact  Insight:  Present  Psychomotor Activity:  Increased  Concentration:  Concentration: Fair and Attention Span: Fair  Recall:  [employer] of Knowledge:  Fair  Language:  Fair  Akathisia:  No  Handed:  Right  AIMS (if indicated):     Assets:  Communication Skills Desire for Improvement Housing Social Support Talents/Skills Transportation  ADL's:  Intact  Cognition:  WNL  Sleep:  ok     Assessment/Plan: Bipolar I disorder (HCC) - Plan: ARIPiprazole (ABILIFY) 5 MG tablet, gabapentin (NEURONTIN) 100 MG capsule, hydrOXYzine (ATARAX) 10 MG tablet  GAD (generalized anxiety disorder) - Plan: gabapentin (NEURONTIN) 100 MG capsule, hydrOXYzine (ATARAX) 10 MG tablet  Polysubstance abuse (HCC) - Plan: gabapentin (NEURONTIN) 100 MG capsule, hydrOXYzine (ATARAX) 10 MG tablet  Attention deficit hyperactivity disorder (ADHD), combined type - Plan: gabapentin (NEURONTIN) 100 MG capsule, hydrOXYzine (ATARAX) 10 MG tablet  Patient doing better on his current medication.  Denies any mania, psychosis, hallucination and not using drugs.  He is taking gabapentin 4 times a day and also sometimes takes hydroxyzine during the day.   Discussed medication side  effects and benefits.  Continue Abilify 5 mg daily, gabapentin 100 mg 4 times a day and Atarax 10 mg 2 times a day.  Discussed attention, concentration and memory issues.  He is not sure.  His medical condition and prolonged infection may have caused these issues but like to address with his doctor on his upcoming appointment.  I offer neurology referral and patient agree.  We will send a referral to [facility] neurology.  Follow-up in 3 months.  Recommend to call us back if is any question or any concern.   Follow Up Instructions:     I discussed the assessment and treatment plan with the patient. The patient was provided an opportunity to ask questions and all were answered. The patient agreed with the plan and demonstrated an understanding of the instructions.   The patient was advised to call back or seek an in-person evaluation if the symptoms worsen or if the condition fails to improve as anticipated.    Collaboration of Care: Other provider involved in patient's care AEB notes are available in epic to review.  Patient/Guardian was advised Release of Information must be obtained prior to any record release in order to collaborate their care with an outside provider. Patient/Guardian was advised if they have not already done so to contact the registration department to sign all necessary forms in order for [place] to release information regarding their care.   Consent: Patient/Guardian gives verbal consent for treatment and assignment of benefits for services provided during this visit. Patient/Guardian expressed understanding and agreed to proceed.     I provided 24 minutes of non face to face time during this encounter.  Note: This document was prepared by [employer] voice dictation technology and any errors that results from this process are unintentional.    Cleotis Nipper, MD 02/05/2023

## 2023-02-07 ENCOUNTER — Telehealth (HOSPITAL_COMMUNITY): Payer: Self-pay | Admitting: *Deleted

## 2023-02-07 NOTE — Telephone Encounter (Signed)
Referral for recent changes in memory/loss sent to [facility].

## 2023-02-28 ENCOUNTER — Other Ambulatory Visit (HOSPITAL_COMMUNITY): Payer: Self-pay | Admitting: Psychiatry

## 2023-02-28 DIAGNOSIS — F319 Bipolar disorder, unspecified: Secondary | ICD-10-CM

## 2023-02-28 DIAGNOSIS — F411 Generalized anxiety disorder: Secondary | ICD-10-CM

## 2023-02-28 DIAGNOSIS — F1910 Other psychoactive substance abuse, uncomplicated: Secondary | ICD-10-CM

## 2023-02-28 DIAGNOSIS — F902 Attention-deficit hyperactivity disorder, combined type: Secondary | ICD-10-CM

## 2023-05-03 ENCOUNTER — Ambulatory Visit: Payer: BLUE CROSS/BLUE SHIELD | Admitting: Neurology

## 2023-05-03 ENCOUNTER — Encounter: Payer: Self-pay | Admitting: Neurology

## 2023-05-03 VITALS — BP 125/76 | HR 83 | Ht 67.0 in | Wt 267.0 lb

## 2023-05-03 DIAGNOSIS — F1911 Other psychoactive substance abuse, in remission: Secondary | ICD-10-CM

## 2023-05-03 DIAGNOSIS — R4189 Other symptoms and signs involving cognitive functions and awareness: Secondary | ICD-10-CM | POA: Diagnosis not present

## 2023-05-03 DIAGNOSIS — F988 Other specified behavioral and emotional disorders with onset usually occurring in childhood and adolescence: Secondary | ICD-10-CM

## 2023-05-03 DIAGNOSIS — G060 Intracranial abscess and granuloma: Secondary | ICD-10-CM

## 2023-05-03 MED ORDER — VITAMIN B-12 1000 MCG PO TABS: 1000.0000 ug | ORAL_TABLET | Freq: Every day | ORAL | 3 refills | Status: DC
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
## 2023-05-03 NOTE — Patient Instructions (Signed)
Continue current medications  Continue to follow up with Dr. Lolly Mustache Start exercising, at least 3 times a week  Make sure you get plenty of sleep Return as needed

## 2023-05-11 ENCOUNTER — Telehealth (HOSPITAL_BASED_OUTPATIENT_CLINIC_OR_DEPARTMENT_OTHER): Payer: Self-pay | Admitting: Psychiatry

## 2023-05-11 ENCOUNTER — Encounter (HOSPITAL_COMMUNITY): Payer: Self-pay

## 2023-05-11 DIAGNOSIS — Z91199 Patient's noncompliance with other medical treatment and regimen due to unspecified reason: Secondary | ICD-10-CM

## 2023-05-11 NOTE — Progress Notes (Signed)
No show

## 2023-05-22 ENCOUNTER — Telehealth (HOSPITAL_COMMUNITY): Payer: Self-pay

## 2023-05-22 NOTE — Telephone Encounter (Signed)
Patient called for refill on Gabapentin, he has a few pills left but he left them in the car and they melted together. He was last seen 5/20 and was a no show on 8/23, patient is scheduled for tomorrow - he does not want to be with out his medication. Please review and advise, thank you

## 2023-05-23 ENCOUNTER — Encounter (HOSPITAL_COMMUNITY): Payer: Self-pay | Admitting: Psychiatry

## 2023-05-23 ENCOUNTER — Encounter (HOSPITAL_COMMUNITY): Payer: Self-pay

## 2023-05-23 ENCOUNTER — Telehealth (HOSPITAL_BASED_OUTPATIENT_CLINIC_OR_DEPARTMENT_OTHER): Payer: BLUE CROSS/BLUE SHIELD | Admitting: Psychiatry

## 2023-05-23 VITALS — Wt 267.0 lb

## 2023-05-23 DIAGNOSIS — F1910 Other psychoactive substance abuse, uncomplicated: Secondary | ICD-10-CM | POA: Diagnosis not present

## 2023-05-23 DIAGNOSIS — F319 Bipolar disorder, unspecified: Secondary | ICD-10-CM

## 2023-05-23 DIAGNOSIS — F411 Generalized anxiety disorder: Secondary | ICD-10-CM | POA: Diagnosis not present

## 2023-05-23 DIAGNOSIS — F902 Attention-deficit hyperactivity disorder, combined type: Secondary | ICD-10-CM

## 2023-05-23 MED ORDER — GABAPENTIN 100 MG PO CAPS: 100.0000 mg | ORAL_CAPSULE | Freq: Four times a day (QID) | ORAL | 2 refills | Status: DC

## 2023-05-23 MED ORDER — ARIPIPRAZOLE 5 MG PO TABS
5.0000 mg | ORAL_TABLET | Freq: Every day | ORAL | 2 refills | Status: DC
Start: 2023-05-23 — End: 2023-08-09

## 2023-05-23 NOTE — Telephone Encounter (Signed)
He is on schedule today.
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
## 2023-05-25 ENCOUNTER — Telehealth (HOSPITAL_BASED_OUTPATIENT_CLINIC_OR_DEPARTMENT_OTHER): Payer: BLUE CROSS/BLUE SHIELD | Admitting: Psychiatry

## 2023-05-25 ENCOUNTER — Encounter (HOSPITAL_COMMUNITY): Payer: Self-pay | Admitting: Licensed Clinical Social Worker

## 2023-05-25 ENCOUNTER — Encounter (HOSPITAL_COMMUNITY): Payer: Self-pay | Admitting: Psychiatry

## 2023-05-25 ENCOUNTER — Telehealth (HOSPITAL_COMMUNITY): Payer: Self-pay | Admitting: Licensed Clinical Social Worker

## 2023-05-25 VITALS — Wt 267.0 lb

## 2023-05-25 DIAGNOSIS — F902 Attention-deficit hyperactivity disorder, combined type: Secondary | ICD-10-CM

## 2023-05-25 DIAGNOSIS — F1910 Other psychoactive substance abuse, uncomplicated: Secondary | ICD-10-CM

## 2023-05-25 MED ORDER — ATOMOXETINE HCL 18 MG PO CAPS
18.0000 mg | ORAL_CAPSULE | Freq: Every day | ORAL | 0 refills | Status: DC
Start: 2023-05-25 — End: 2023-06-25

## 2023-05-25 NOTE — Telephone Encounter (Signed)
The therapist attempts to reach this patient in response to a referral received by Dr. Lolly Mustache; however, the therapist is unable to leave a HIPAA-compliant voicemail as the patient has a voicemail which has not been setup yet.  Thus, the therapist sends a letter to this patient via MyChart with his direct contact number.  Myrna Blazer, MA, LCSW, [facility], LCAS 05/25/2023

## 2023-05-25 NOTE — Progress Notes (Signed)
[facility] MD Virtual Progress Note   Patient Location: Home Provider Location: Home Office  I connect with patient by video and verified that I am speaking with correct person by using two identifiers. I discussed the limitations of evaluation and management by telemedicine and the availability of in person appointments. I also discussed with the patient that there may be a patient responsible charge related to this service. The patient expressed understanding and agreed to proceed.  Devin Barker [id number] 27 y.o.  05/25/2023 9:21 AM  History of Present Illness:  Patient is evaluated by video session.  He requested urgent appointment because he relapsed last time on meth.  He feels very guilty about it.  He admitted he was doing so well but the stress from the school may have triggered her.  He brought 1 g and he used at least 80%.  He admitted rest of drug he threw away.  He admitted that very scared, nervous, bad about himself.  He does not want to go back to his previous condition.  He admitted he had been doing very well lately and is planning to finish school, getting married and he do not want to mess up.  He admitted issue with attention, concentration, not able to focus and difficulty completing his task in assignment.  He also reported going late to school.  He also endorsed once in a while he used marijuana.  Denies any hallucination, paranoia, suicidal thoughts.  Denies any aggression, violence, or having arguments with family member.  He reported relationship is going well with his overseas.  His appetite is okay.  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  About his grades.  He recently saw neurology and his MoCa was 21/30.  He is disappointed because neurology did not do repeat MRI but told it could have swelling in his brain that may be reasoning of his memory issues.  He has no primary care since  her shoulders change.  Past Psychiatric History: History of poor impulse control, highs and lows, paranoia, heavy drug use.  History of using cocaine, IVDA, crystal meth and multiple rehab.  Diagnosed with ADHD and given Adderall by a local psychiatrist which [place].  History of overdose in 2019 when he was living in sedan. Given fluoxetine, amitriptyline and Tegretol overseas but stopped the medicine in [place] when it did not work.    Outpatient Encounter Medications as of 05/25/2023  Medication Sig   ARIPiprazole (ABILIFY) 5 MG tablet Take 1 tablet (5 mg total) by mouth daily.   cyanocobalamin (VITAMIN B12) 1000 MCG tablet Take 1 tablet (1,000 mcg total) by mouth daily.   gabapentin (NEURONTIN) 100 MG capsule Take 1 capsule (100 mg total) by mouth 4 (four) times daily. Take one capsule twice a day and two capsule at bed time.   hydrOXYzine (ATARAX) 10 MG tablet Take 1 tablet (10 mg total) by mouth 2 (two) times daily as needed for anxiety.   No facility-administered encounter medications on file as of 05/25/2023.    No results found for this or any previous visit (from the past 2160 hour(s)).   Psychiatric Specialty Exam: Physical Exam  Review of Systems  Weight 267 lb (121.1 kg).There is no height or weight on file to calculate BMI.  General Appearance: Casual  Eye Contact:  Fair  Speech:  Pressured and fast  Volume:  Increased  Mood:  Anxious, Depressed, and Dysphoric  Affect:  Constricted  Thought Process:  Descriptions of Associations: Intact  Orientation:  Full (Time, Place, and Person)  Thought Content:  Rumination  Suicidal Thoughts:  No  Homicidal Thoughts:  No  Memory:  Immediate;   Good Recent;   Fair Remote;   Fair  Judgement:  Fair  Insight:  Shallow  Psychomotor Activity:  Increased  Concentration:  Concentration: Fair and Attention Span: Fair  Recall:  [employer] of Knowledge:  Fair  Language:  Good  Akathisia:  No  Handed:  Right  AIMS (if  indicated):     Assets:  Communication Skills Desire for Improvement Housing Resilience Social Support Transportation  ADL's:  Intact  Cognition:  Impaired,  Mild  Sleep:  ok     Assessment/Plan: Polysubstance abuse (HCC)  Attention deficit hyperactivity disorder (ADHD), combined type - Plan: atomoxetine (STRATTERA) 18 MG capsule  I discussed about relapse into meth.  He is very stressed about his school.  He is not making good grades which could be due to his memory issues and ADHD.  He is also working 40 hours a week.  I recommend to take the semester off but patient refused because he made a deal with his father that he will finish the school so he can get married.  He like to try something to help his ADHD.  I explain working 40 hours a week, starting at school with memory impairment and ADHD may be a difficult at this time.  He also reported not very faithful with his religion and he like to go back to religion that helps him in the past.  He does not want to have an issue with his father and he like to finish the school.  We will start a low-dose Strattera with a nonstimulant ADHD however I explained it can only help the ADHD symptoms but may not help his cognitive impairment.  Due to his past procedure, brain abscess, he need to be very careful about the side effects.  He do not have any history of seizures.  I also emphasized the need to consider CDIOP strongly to help his coping skills and to remain sober from drugs.  Patient agreed with the plan.  We will refer her to CDIOP.  Start Strattera 80 mg daily.  He will continue all his medication.  Discussed medication side effects and benefits.  Discussed safety concerns and any time having active suicidal thoughts or homicidal thought any need to call 911 or go to local emergency room.  I will follow up after he finished CDIOP.  I also emphasized the need to call his insurance to find a PCP.  His last MRI was done in 2023 and I do believe we  need to repeat the MRI to see if swelling resolved.  Patient acknowledged and agreed with the recommendations.   Follow Up Instructions:     I discussed the assessment and treatment plan with the patient. The patient was provided an opportunity to ask questions and all were answered. The patient agreed with the plan and demonstrated an understanding of the instructions.   The patient was advised to call back or seek an in-person evaluation if the symptoms worsen or if the condition fails to improve as anticipated.    Collaboration of Care: Other provider involved in patient's care AEB 's are available in epic to review.  Patient/Guardian was advised Release of Information must be obtained prior to any record release in order to collaborate their care with an  outside provider. Patient/Guardian was advised if they have not already done so to contact the registration department to sign all necessary forms in order for [place] to release information regarding their care.   Consent: Patient/Guardian gives verbal consent for treatment and assignment of benefits for services provided during this visit. Patient/Guardian expressed understanding and agreed to proceed.     I provided 32 minutes of non face to face time during this encounter.  Note: This document was prepared by [employer] voice dictation technology and any errors that results from this process are unintentional.    Cleotis Nipper, MD 05/25/2023

## 2023-05-28 ENCOUNTER — Telehealth (HOSPITAL_COMMUNITY): Payer: Self-pay

## 2023-05-28 NOTE — Telephone Encounter (Signed)
This therapist calls this patient that was referred by Dr. Lolly Mustache for substance use services.  Therapist reached VM which was full.  No message could be left.  Devin Barker, M.S.,  LMFT, LCAS

## 2023-05-29 ENCOUNTER — Telehealth (HOSPITAL_COMMUNITY): Payer: Self-pay

## 2023-05-29 NOTE — Telephone Encounter (Signed)
This therapist contacts Bill Garrot, LCSW, [facility] , LCAS to ask if he could put this patient on his individual schedule.  Bill, in turn asked front desk to schedule this patient on his Tuesday/Thursday schedule.  Remigio Eisenmenger, MS, LMFT, LCAS

## 2023-05-30 ENCOUNTER — Other Ambulatory Visit (HOSPITAL_COMMUNITY): Payer: Self-pay

## 2023-05-30 ENCOUNTER — Ambulatory Visit (HOSPITAL_COMMUNITY): Payer: BLUE CROSS/BLUE SHIELD

## 2023-05-30 DIAGNOSIS — Z79899 Other long term (current) drug therapy: Secondary | ICD-10-CM

## 2023-05-30 NOTE — Progress Notes (Signed)
Patient arrived for his lab work. Dr. Lolly Mustache ordered a CBC, CMP and an A!c. Patient asked what the labs were for and I explained each one. Patient was satisfied and we proceeded to collect the specimens. Venipuncture done with a 22g straight needle in the right AC. Patient tolerated well and without complaint. I advised patient we would call with results. He had no more questions.

## 2023-05-31 ENCOUNTER — Telehealth (HOSPITAL_COMMUNITY): Payer: Self-pay | Admitting: *Deleted

## 2023-05-31 LAB — COMPREHENSIVE METABOLIC PANEL
ALT: 16 IU/L (ref 0–44)
AST: 16 IU/L (ref 0–40)
Albumin: 3.9 g/dL — ABNORMAL LOW (ref 4.3–5.2)
Alkaline Phosphatase: 79 IU/L (ref 44–121)
BUN/Creatinine Ratio: 9 (ref 9–20)
BUN: 7 mg/dL (ref 6–20)
Bilirubin Total: <0.2 mg/dL (ref 0.0–1.2)
CO2: 24 mmol/L (ref 20–29)
Calcium: 9.4 mg/dL (ref 8.7–10.2)
Chloride: 100 mmol/L (ref 96–106)
Creatinine, Ser: 0.76 mg/dL (ref 0.76–1.27)
Globulin, Total: 3.2 g/dL (ref 1.5–4.5)
Glucose: 92 mg/dL (ref 70–99)
Potassium: 4.7 mmol/L (ref 3.5–5.2)
Sodium: 138 mmol/L (ref 134–144)
Total Protein: 7.1 g/dL (ref 6.0–8.5)
eGFR: 127 mL/min/{1.73_m2} (ref >59)

## 2023-05-31 LAB — CBC WITH DIFFERENTIAL/PLATELET
Basophils Absolute: 0.2 10*3/uL (ref 0.0–0.2)
Basos: 1 %
EOS (ABSOLUTE): 2.4 10*3/uL — ABNORMAL HIGH (ref 0.0–0.4)
Eos: 17 %
Hematocrit: 46.4 % (ref 37.5–51.0)
Hemoglobin: 14.7 g/dL (ref 13.0–17.7)
Immature Grans (Abs): 0.1 10*3/uL (ref 0.0–0.1)
Immature Granulocytes: 1 %
Lymphocytes Absolute: 4.7 10*3/uL — ABNORMAL HIGH (ref 0.7–3.1)
Lymphs: 33 %
MCH: 25.2 pg — ABNORMAL LOW (ref 26.6–33.0)
MCHC: 31.7 g/dL (ref 31.5–35.7)
MCV: 80 fL (ref 79–97)
Monocytes Absolute: 0.7 10*3/uL (ref 0.1–0.9)
Monocytes: 5 %
Neutrophils Absolute: 6.3 10*3/uL (ref 1.4–7.0)
Neutrophils: 43 %
Platelets: 550 10*3/uL — ABNORMAL HIGH (ref 150–450)
RBC: 5.84 x10E6/uL — ABNORMAL HIGH (ref 4.14–5.80)
RDW: 15.5 % — ABNORMAL HIGH (ref 11.6–15.4)
WBC: 14.3 10*3/uL — ABNORMAL HIGH (ref 3.4–10.8)

## 2023-05-31 LAB — HEMOGLOBIN A1C
Est. average glucose Bld gHb Est-mCnc: 123 mg/dL
Hgb A1c MFr Bld: 5.9 % — ABNORMAL HIGH (ref 4.8–5.6)

## 2023-05-31 NOTE — Telephone Encounter (Signed)
Results for labs (CBC W/Diff, CMP, HgBA1C) drawn 05/30/23 received from [facility] this morning. Please review and note: WBC 14.3 RBC 5.84 PLATELETS 550 LYMPHS (ABSOLUTE) 4.7 EOS (ABSOLUTE) 2.4 MCH 25.2 RDW 15.5 ALBUMIN 3.9 HgBA1c 5.9  PREVIOUS LABS IN EMR WERE RESULTED ON 01/16/23.

## 2023-06-04 ENCOUNTER — Ambulatory Visit: Payer: Self-pay | Admitting: Neurology

## 2023-06-07 ENCOUNTER — Ambulatory Visit (INDEPENDENT_AMBULATORY_CARE_PROVIDER_SITE_OTHER): Payer: BLUE CROSS/BLUE SHIELD | Admitting: Licensed Clinical Social Worker

## 2023-06-07 DIAGNOSIS — F1910 Other psychoactive substance abuse, uncomplicated: Secondary | ICD-10-CM | POA: Diagnosis not present

## 2023-06-07 DIAGNOSIS — F319 Bipolar disorder, unspecified: Secondary | ICD-10-CM

## 2023-06-07 NOTE — Progress Notes (Signed)
THERAPIST PROGRESS NOTE  Session Time: 4:10 p.m. to 5:20 p.m.   Type of Therapy: Individual   Therapist Response/Interventions: Solution Focused/The therapist observes that Julien is working a schedule that appears to be overloaded and not conducive to his mood stability questioning if he might possibly be able to work less or take less classes in school. Additionally, the therapist observes that Ellen lacks sober supports and talks to him about the need for NA attendance.  The therapist questions how Alexaner would be able to fit the time required to attend SA IOP into his current schedule talking to him about inpatient residential treatment as another option. He also makes Jakeb aware of the evening IOP at the [facility].  He observes that Selso's major relapse was precipitated by smoking marijuana and explains to him about how Delta 8 use will negatively impact his ability to maintain long-term sobriety from other substances as well.   Treatment Goals addressed: The therapist defers doing an electroinc Care Plan as Zia is not going to return to see this therapist as he will be seeking a higher level of care outside of [place]. His goal is to abstain from using Cocaine and Meth and for improvement in his mood.   Summary: Antrone presents having been referred to this therapist by Dr. Lolly Mustache to attend SA IOP. Currently, Tait is working and attending school a combined 60-75 hours per week. He gets a combined 8 hours of sleep noting that he takes a 2 hour nap when at work and sleeps 6 hours at night.His PHQ-9 is a 22 and his GAD-7 is a 17.  Montrell last used Meth a week ago via IV and snorted Cocaine about 2 weeks ago. Additionally, he has been using Delta-8 daily having smoked it this morning and smokes a pack of cigarettes per day. Xsavior says that he has never attended NA. He says that his longest sobriety was a year-and-nine months  beginning in 2020. This sobriety occurred when he went to [place] and attended some "detox programs" which he admits were subpar. Since returning to the [place], he has relapsed three times. He says that his initial relapse occurred when he "ran across some weed" and smoked it which eventually led to using Cocaine and Meth. He admits to testing positive for opiates a couple of years ago; however, says that this was mostly an isolated incident denying regular use of them.   Bijon questions if his relapses may not have been triggered by forgetting to take his Bipolar medications and later says that the stress of school is a trigger. He started school in 2016; however, due to his substance use has been unable to finish and  now has a year or two left. He is trying to rush to finish school as he wants to marry is fiance in [place]. Graiden notes that he fled a war in [place] wondering if he may have some unresolved trauma issues.  He is very tearful during the later half of the session understanding that it would probably be best to go to residential treatment; however, he says that he lives with his parents and does not want to tell them that he relapsed given that they put so much into his being sent to [place] for almost two years to stop using. Also, he believes that if he were to let his fiance in [place] know he had used that she would break off the marriage. He has no one with whom he can talk  about his relapse or his struggles with his addictiton.   Progress Towards Goals: Initial  Suicidal/Homicidal: No SI or HI  Plan: Parson will consider whether to go to inpatient residential or pursue SA IOP. If he pursues SA IOP, he will call the [facility] for their evening program as it will not conflict with his school schedule. He will continue to see Dr. Lolly Mustache and will call this therapist again on a p.r.n. basis. He is encouraged to attend NA meetings as he presently has no sober  supports.  Diagnosis: Polysubstance Abuse and Bipolar I Disorder  Collaboration of Care: Other N/A  Patient/Guardian was advised Release of Information must be obtained prior to any record release in order to collaborate their care with an outside provider. Patient/Guardian was advised if they have not already done so to contact the registration department to sign all necessary forms in order for [place] to release information regarding their care.   Consent: Patient/Guardian gives verbal consent for treatment and assignment of benefits for services provided during this visit. Patient/Guardian expressed understanding and agreed to proceed.   Myrna Blazer, MA, LCSW, [facility], LCAS 06/07/2023

## 2023-06-21 ENCOUNTER — Other Ambulatory Visit (HOSPITAL_COMMUNITY): Payer: Self-pay | Admitting: Psychiatry

## 2023-06-21 DIAGNOSIS — F902 Attention-deficit hyperactivity disorder, combined type: Secondary | ICD-10-CM

## 2023-06-25 ENCOUNTER — Telehealth (HOSPITAL_COMMUNITY): Payer: Self-pay

## 2023-06-25 DIAGNOSIS — F902 Attention-deficit hyperactivity disorder, combined type: Secondary | ICD-10-CM

## 2023-06-25 MED ORDER — ATOMOXETINE HCL 18 MG PO CAPS
18.0000 mg | ORAL_CAPSULE | Freq: Every day | ORAL | 1 refills | Status: DC
Start: 2023-06-25 — End: 2023-08-22

## 2023-06-25 NOTE — Telephone Encounter (Signed)
Patient is calling for a refill on his Strattera last filled on 9/6, patient has a follow up on 12/4. Please review and advise,thank you

## 2023-06-25 NOTE — Telephone Encounter (Signed)
I sent prescription to [facility].  Patient should stop using drugs.  We have recommended CDIOP.

## 2023-08-09 ENCOUNTER — Other Ambulatory Visit (HOSPITAL_COMMUNITY): Payer: Self-pay

## 2023-08-09 ENCOUNTER — Telehealth (HOSPITAL_COMMUNITY): Payer: Self-pay

## 2023-08-09 DIAGNOSIS — F411 Generalized anxiety disorder: Secondary | ICD-10-CM

## 2023-08-09 DIAGNOSIS — F902 Attention-deficit hyperactivity disorder, combined type: Secondary | ICD-10-CM

## 2023-08-09 DIAGNOSIS — F1910 Other psychoactive substance abuse, uncomplicated: Secondary | ICD-10-CM

## 2023-08-09 DIAGNOSIS — F319 Bipolar disorder, unspecified: Secondary | ICD-10-CM

## 2023-08-09 MED ORDER — GABAPENTIN 100 MG PO CAPS: 100.0000 mg | ORAL_CAPSULE | Freq: Four times a day (QID) | ORAL | 0 refills | Status: DC

## 2023-08-09 MED ORDER — ARIPIPRAZOLE 5 MG PO TABS: 5.0000 mg | ORAL_TABLET | Freq: Every day | ORAL | 0 refills | Status: DC

## 2023-08-09 NOTE — Telephone Encounter (Signed)
Medication management - Telephone call with Sheral Flow, pharmacist at [facility] in [place], [place] 2 times and with patient twice to follow up on why patient was needing to fill a new 10 day orer of Gabapentin early when he last filled for #120 on 07/24/23.  Patient stated he had left the medication at his home in [place] and was now back in [place] and needed to have a 10 day order to last until could be filled again.  Discussed with Dr. Lolly Mustache who approved this one time fill early of the 10 day supply he sent in today and pharmacist agreed to fill.  Informed patient Dr. Lolly Mustache would not do this again as would need to keep up with his medications for future refills. Patient stated understanding.

## 2023-08-20 ENCOUNTER — Other Ambulatory Visit (HOSPITAL_COMMUNITY): Payer: Self-pay | Admitting: Psychiatry

## 2023-08-20 DIAGNOSIS — F319 Bipolar disorder, unspecified: Secondary | ICD-10-CM

## 2023-08-20 DIAGNOSIS — F1910 Other psychoactive substance abuse, uncomplicated: Secondary | ICD-10-CM

## 2023-08-20 DIAGNOSIS — F902 Attention-deficit hyperactivity disorder, combined type: Secondary | ICD-10-CM

## 2023-08-20 DIAGNOSIS — F411 Generalized anxiety disorder: Secondary | ICD-10-CM

## 2023-08-22 ENCOUNTER — Encounter (HOSPITAL_COMMUNITY): Payer: Self-pay | Admitting: Psychiatry

## 2023-08-22 ENCOUNTER — Telehealth (HOSPITAL_BASED_OUTPATIENT_CLINIC_OR_DEPARTMENT_OTHER): Payer: BLUE CROSS/BLUE SHIELD | Admitting: Psychiatry

## 2023-08-22 VITALS — Wt 270.0 lb

## 2023-08-22 DIAGNOSIS — F319 Bipolar disorder, unspecified: Secondary | ICD-10-CM

## 2023-08-22 DIAGNOSIS — F902 Attention-deficit hyperactivity disorder, combined type: Secondary | ICD-10-CM | POA: Diagnosis not present

## 2023-08-22 DIAGNOSIS — F1910 Other psychoactive substance abuse, uncomplicated: Secondary | ICD-10-CM | POA: Diagnosis not present

## 2023-08-22 DIAGNOSIS — F411 Generalized anxiety disorder: Secondary | ICD-10-CM | POA: Diagnosis not present

## 2023-08-22 MED ORDER — ATOMOXETINE HCL 18 MG PO CAPS: 18.0000 mg | ORAL_CAPSULE | Freq: Every day | ORAL | 1 refills | Status: DC

## 2023-08-22 MED ORDER — GABAPENTIN 100 MG PO CAPS: 100.0000 mg | ORAL_CAPSULE | Freq: Every day | ORAL | 1 refills | Status: DC

## 2023-08-22 MED ORDER — ARIPIPRAZOLE 15 MG PO TABS: 7.5000 mg | ORAL_TABLET | Freq: Every day | ORAL | 1 refills | Status: DC
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
## 2023-10-04 ENCOUNTER — Encounter: Payer: BLUE CROSS/BLUE SHIELD | Admitting: Urology

## 2023-10-04 NOTE — Progress Notes (Deleted)
Assessment: No diagnosis found.   Plan: ***  Chief Complaint: No chief complaint on file.   History of Present Illness:  Devin Barker is a 28 y.o. 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, bipolar disorder, anxiety, ADD  who is seen      Past Medical History:  Past Medical History:  Diagnosis Date   Knee dislocation    Obesity     Past Surgical History:  Past Surgical History:  Procedure Laterality Date   LUMBAR WOUND DEBRIDEMENT Right 07/31/2022   Procedure: Right Craniectomy for Mass Resection;  Surgeon: Coletta Memos, MD;  Location: [facility] OR;  Service: Neurosurgery;  Laterality: Right;   MEDIAL PATELLOFEMORAL LIGAMENT REPAIR Right 10/16/2013   Procedure: ARTHROSCOPY, RIGHT OPEN RECONSTRUCTION AT THE MEDIAL PATELLA FEMORAL LIGAMENT;  Surgeon: Senaida Lange, MD;  Location: [facility] OR;  Service: Orthopedics;  Laterality: Right;  ANESTHESIA: GENERAL/FEMORAL NERVE BLOCK   TONSILLECTOMY      Allergies:  Allergies  Allergen Reactions   Other     Pt prefers medications that do not have gelatins. No capsules, gel caps etc....due to religious reasons  Pt also had a reaction to laughing gas (liquid form)     Family History:  Family History  Problem Relation Age of Onset   Diabetes Mother    Hyperlipidemia Mother    Hypertension Father    Kidney disease Sister    Diabetes Maternal Grandmother    Diabetes Paternal Grandfather     Social History:  Social History   Tobacco Use   Smoking status: Every Day    Current packs/day: 1.00    Types: Cigarettes   Smokeless tobacco: Former    Types: Associate Professor status: Never Used  Substance Use Topics   Alcohol use: No    Comment: occasionally   Drug use: Yes    Types: Marijuana    Comment: last dose yesterday    Review of symptoms:  Constitutional:  Negative for unexplained weight loss, night sweats, fever,  chills ENT:  Negative for nose bleeds, sinus pain, painful swallowing CV:  Negative for chest pain, shortness of breath, exercise intolerance, palpitations, loss of consciousness Resp:  Negative for cough, wheezing, shortness of breath GI:  Negative for nausea, vomiting, diarrhea, bloody stools GU:  Positives noted in HPI; otherwise negative for gross hematuria, dysuria, urinary incontinence Neuro:  Negative for seizures, poor balance, limb weakness, slurred speech Psych:  Negative for lack of energy, depression, anxiety Endocrine:  Negative for polydipsia, polyuria, symptoms of hypoglycemia (dizziness, hunger, sweating) Hematologic:  Negative for anemia, purpura, petechia, prolonged or excessive bleeding, use of anticoagulants  Allergic:  Negative for difficulty breathing or choking as a result of exposure to anything; no shellfish allergy; no allergic response (rash/itch) to materials, foods  Physical exam: There were no vitals taken for this visit. GENERAL APPEARANCE:  Well appearing, well developed, well nourished, NAD HEENT: Atraumatic, Normocephalic, oropharynx clear. NECK: Supple without lymphadenopathy or thyromegaly. LUNGS: Clear to auscultation bilaterally. HEART: Regular Rate and Rhythm without murmurs, gallops, or rubs. ABDOMEN: Soft, non-tender, No Masses. EXTREMITIES: Moves all extremities well.  Without clubbing, cyanosis, or edema. NEUROLOGIC:  Alert and oriented x 3, normal gait, CN II-XII grossly intact.  MENTAL STATUS:  Appropriate. BACK:  Non-tender to palpation.  No CVAT SKIN:  Warm, dry and intact.    Results: No results found for this or any previous visit (from  the past 24 hours).

## 2023-10-08 ENCOUNTER — Encounter: Payer: BLUE CROSS/BLUE SHIELD | Admitting: Urology

## 2023-10-08 NOTE — Progress Notes (Deleted)
Assessment: 1. Erectile dysfunction, unspecified erectile dysfunction type      Plan: ***  Chief Complaint: No chief complaint on file.   History of Present Illness:  Devin Barker is a 28 y.o. 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, bipolar disorder, anxiety, ADD  who is seen      Past Medical History:  Past Medical History:  Diagnosis Date   Knee dislocation    Obesity     Past Surgical History:  Past Surgical History:  Procedure Laterality Date   LUMBAR WOUND DEBRIDEMENT Right 07/31/2022   Procedure: Right Craniectomy for Mass Resection;  Surgeon: Coletta Memos, MD;  Location: [facility] OR;  Service: Neurosurgery;  Laterality: Right;   MEDIAL PATELLOFEMORAL LIGAMENT REPAIR Right 10/16/2013   Procedure: ARTHROSCOPY, RIGHT OPEN RECONSTRUCTION AT THE MEDIAL PATELLA FEMORAL LIGAMENT;  Surgeon: Senaida Lange, MD;  Location: [facility] OR;  Service: Orthopedics;  Laterality: Right;  ANESTHESIA: GENERAL/FEMORAL NERVE BLOCK   TONSILLECTOMY      Allergies:  Allergies  Allergen Reactions   Other     Pt prefers medications that do not have gelatins. No capsules, gel caps etc....due to religious reasons  Pt also had a reaction to laughing gas (liquid form)     Family History:  Family History  Problem Relation Age of Onset   Diabetes Mother    Hyperlipidemia Mother    Hypertension Father    Kidney disease Sister    Diabetes Maternal Grandmother    Diabetes Paternal Grandfather     Social History:  Social History   Tobacco Use   Smoking status: Every Day    Current packs/day: 1.00    Types: Cigarettes   Smokeless tobacco: Former    Types: Associate Professor status: Never Used  Substance Use Topics   Alcohol use: No    Comment: occasionally   Drug use: Yes    Types: Marijuana    Comment: last dose yesterday    Review of symptoms:  Constitutional:  Negative for  unexplained weight loss, night sweats, fever, chills ENT:  Negative for nose bleeds, sinus pain, painful swallowing CV:  Negative for chest pain, shortness of breath, exercise intolerance, palpitations, loss of consciousness Resp:  Negative for cough, wheezing, shortness of breath GI:  Negative for nausea, vomiting, diarrhea, bloody stools GU:  Positives noted in HPI; otherwise negative for gross hematuria, dysuria, urinary incontinence Neuro:  Negative for seizures, poor balance, limb weakness, slurred speech Psych:  Negative for lack of energy, depression, anxiety Endocrine:  Negative for polydipsia, polyuria, symptoms of hypoglycemia (dizziness, hunger, sweating) Hematologic:  Negative for anemia, purpura, petechia, prolonged or excessive bleeding, use of anticoagulants  Allergic:  Negative for difficulty breathing or choking as a result of exposure to anything; no shellfish allergy; no allergic response (rash/itch) to materials, foods  Physical exam: There were no vitals taken for this visit. GENERAL APPEARANCE:  Well appearing, well developed, well nourished, NAD HEENT: Atraumatic, Normocephalic, oropharynx clear. NECK: Supple without lymphadenopathy or thyromegaly. LUNGS: Clear to auscultation bilaterally. HEART: Regular Rate and Rhythm without murmurs, gallops, or rubs. ABDOMEN: Soft, non-tender, No Masses. EXTREMITIES: Moves all extremities well.  Without clubbing, cyanosis, or edema. NEUROLOGIC:  Alert and oriented x 3, normal gait, CN II-XII grossly intact.  MENTAL STATUS:  Appropriate. BACK:  Non-tender to palpation.  No CVAT SKIN:  Warm, dry and intact.   GU: Penis:  {Exam; penis:5791}  Meatus: {Meatus:15530} Scrotum: {pe scrotum:310183} Testis: {Exam; testicles:5790}   Results: None

## 2023-10-15 ENCOUNTER — Other Ambulatory Visit (HOSPITAL_COMMUNITY): Payer: Self-pay | Admitting: Psychiatry

## 2023-10-15 ENCOUNTER — Telehealth (HOSPITAL_COMMUNITY): Payer: Self-pay

## 2023-10-15 DIAGNOSIS — F411 Generalized anxiety disorder: Secondary | ICD-10-CM

## 2023-10-15 DIAGNOSIS — F1910 Other psychoactive substance abuse, uncomplicated: Secondary | ICD-10-CM

## 2023-10-15 DIAGNOSIS — F902 Attention-deficit hyperactivity disorder, combined type: Secondary | ICD-10-CM

## 2023-10-15 DIAGNOSIS — F319 Bipolar disorder, unspecified: Secondary | ICD-10-CM

## 2023-10-15 NOTE — Telephone Encounter (Signed)
He is on gabapentin 100 mg 5 times a day.  He can take extra 1 pill to help with anxiety.

## 2023-10-15 NOTE — Telephone Encounter (Signed)
Patient is calling because his anxiety has gotten much worse, he is getting married next month and he thinks that this may have something to do with it. Patient follows up with you on 10/23/2023. Please review and advise, thank you

## 2023-10-16 ENCOUNTER — Other Ambulatory Visit (HOSPITAL_COMMUNITY): Payer: Self-pay

## 2023-10-16 DIAGNOSIS — F319 Bipolar disorder, unspecified: Secondary | ICD-10-CM

## 2023-10-16 DIAGNOSIS — F1910 Other psychoactive substance abuse, uncomplicated: Secondary | ICD-10-CM

## 2023-10-16 DIAGNOSIS — F411 Generalized anxiety disorder: Secondary | ICD-10-CM

## 2023-10-16 DIAGNOSIS — F902 Attention-deficit hyperactivity disorder, combined type: Secondary | ICD-10-CM

## 2023-10-16 MED ORDER — GABAPENTIN 100 MG PO CAPS: 100.0000 mg | ORAL_CAPSULE | Freq: Every day | ORAL | 0 refills | Status: DC

## 2023-10-16 NOTE — Telephone Encounter (Signed)
Called patient and let him know - new rx was sent to the pharmacy. Patient voiced his understanding and confirmed his upcoming appointment

## 2023-10-24 ENCOUNTER — Encounter (HOSPITAL_COMMUNITY): Payer: Self-pay | Admitting: Psychiatry

## 2023-10-24 ENCOUNTER — Telehealth (HOSPITAL_COMMUNITY): Payer: No Typology Code available for payment source | Admitting: Psychiatry

## 2023-10-24 ENCOUNTER — Telehealth (HOSPITAL_COMMUNITY): Payer: Self-pay | Admitting: Psychiatry

## 2023-10-24 VITALS — Wt 269.0 lb

## 2023-10-24 DIAGNOSIS — F319 Bipolar disorder, unspecified: Secondary | ICD-10-CM

## 2023-10-24 DIAGNOSIS — F1910 Other psychoactive substance abuse, uncomplicated: Secondary | ICD-10-CM | POA: Diagnosis not present

## 2023-10-24 DIAGNOSIS — F902 Attention-deficit hyperactivity disorder, combined type: Secondary | ICD-10-CM

## 2023-10-24 DIAGNOSIS — F411 Generalized anxiety disorder: Secondary | ICD-10-CM

## 2023-10-24 MED ORDER — GABAPENTIN 300 MG PO CAPS: 300.0000 mg | ORAL_CAPSULE | Freq: Three times a day (TID) | ORAL | 1 refills | Status: DC

## 2023-10-24 MED ORDER — ARIPIPRAZOLE 15 MG PO TABS: 7.5000 mg | ORAL_TABLET | Freq: Every day | ORAL | 1 refills | Status: DC

## 2023-10-24 MED ORDER — ATOMOXETINE HCL 18 MG PO CAPS: 18.0000 mg | ORAL_CAPSULE | Freq: Every day | ORAL | 1 refills | Status: DC

## 2023-10-24 NOTE — Progress Notes (Signed)
[facility] MD Virtual Progress Note   Patient Location: In Car Provider Location: Home Office  I connect with patient by video and verified that I am speaking with correct person by using two identifiers. I discussed the limitations of evaluation and management by telemedicine and the availability of in person appointments. I also discussed with the patient that there may be a patient responsible charge related to this service. The patient expressed understanding and agreed to proceed.  Devin Barker [id number] 28 y.o.  10/24/2023 9:25 AM  History of Present Illness:  Patient is evaluated by video session.  He admitted a lot of excitement as getting matted in few weeks.  Patient told he is going to [place] for marriage as his future wife studying there.  He reported to school is going well and he is making better grades this year.  He had accommodation from the professor and that has been helpful.  Patient told he quit working for [employer] because it was not working well for him.  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  He called few days ago requesting higher dose of gabapentin because he feels nervous anxious and not sure because he is getting married or worried about his future.  He feels very proud that he stopped using drugs.  He also started going to gym.  He is hoping to graduate in a 1 year.  He started walking every day.  He lost 2 pounds.  We have increased Abilify on the last visit and that helping him a lot.  Patient told that he is sleeping at least 7 hours.  He has no tremors, shakes or any EPS.  He is taking Strattera which is helping his focus attention.  He reported his memory is somewhat better but he need to follow-up with the neurology for repeat MRI.  Patient has appointment coming up with his primary care in few days.  He will get his labs.  Denies any mania, psychosis, impulsive  behavior.  Denies any major panic attack but admitted feeling nervous and anxious sometimes.  He is taking gabapentin 100 mg up to 7 to 8 pills a day which he feels to keep him calm.  He is getting along very well with his parents.  Past Psychiatric History: History of poor impulse control, highs and lows, paranoia, heavy drug use.  History of using cocaine, IVDA, crystal meth and multiple rehab.  Diagnosed with ADHD and given Adderall by a local psychiatrist [place].  History of overdose in 2019 when he was living in sedan. Given fluoxetine, amitriptyline and Tegretol overseas but stopped the medicine in [place] when it did not work.    Outpatient Encounter Medications as of 10/24/2023  Medication Sig   ARIPiprazole (ABILIFY) 15 MG tablet Take 0.5 tablets (7.5 mg total) by mouth daily.   atomoxetine (STRATTERA) 18 MG capsule Take 1 capsule (18 mg total) by mouth daily.   cyanocobalamin (VITAMIN B12) 1000 MCG tablet Take 1 tablet (1,000 mcg total) by mouth daily.   gabapentin (NEURONTIN) 100 MG capsule Take 1 capsule (100 mg total) by mouth 5 (five) times daily. Take one capsule twice a day and two capsule at bed time - may take one extra a day for anxiety.   hydrOXYzine (ATARAX) 10 MG tablet Take 1 tablet (10 mg total) by mouth 2 (two) times daily as needed for anxiety.   No facility-administered encounter  medications on file as of 10/24/2023.    No results found for this or any previous visit (from the past 2160 hours).   Psychiatric Specialty Exam: Physical Exam  Review of Systems  There were no vitals taken for this visit.There is no height or weight on file to calculate BMI.  General Appearance: Casual  Eye Contact:  Good  Speech:   fast  Volume:  Increased  Mood:   emotional  Affect:  Labile  Thought Process:  Descriptions of Associations: Intact  Orientation:  Full (Time, Place, and Person)  Thought Content:  Logical  Suicidal Thoughts:  No  Homicidal Thoughts:   No  Memory:  Immediate;   Good Recent;   Fair Remote;   Fair  Judgement:  Intact  Insight:  Present  Psychomotor Activity:  Increased  Concentration:  Concentration: Fair and Attention Span: Fair  Recall:  Good  Fund of Knowledge:  Good  Language:  Good  Akathisia:  No  Handed:  Right  AIMS (if indicated):     Assets:  Communication Skills Desire for Improvement Housing Resilience Talents/Skills Transportation  ADL's:  Intact  Cognition:  WNL  Sleep:  ok     Assessment/Plan: Bipolar I disorder (HCC) - Plan: ARIPiprazole (ABILIFY) 15 MG tablet, gabapentin (NEURONTIN) 300 MG capsule  Attention deficit hyperactivity disorder (ADHD), combined type - Plan: atomoxetine (STRATTERA) 18 MG capsule, gabapentin (NEURONTIN) 300 MG capsule  GAD (generalized anxiety disorder) - Plan: gabapentin (NEURONTIN) 300 MG capsule  Polysubstance abuse (HCC) - Plan: gabapentin (NEURONTIN) 300 MG capsule  Discussed upcoming wedding and traveling.  He is going to [place] on March 6.  We talked about long-term prognosis, risk of relapse of the medication.  Emphasized given to take the medicine as prescribed and not to miss the dose.  Encouraged to keep appointment with his primary care.  He would like to increase dose of Abilify and tolerating very well.  We talked about adjusting the gabapentin to take 300 mg 3 times a day as currently he is taking 100 mg up to 7 to 8 pills a day.  He feel it helps his anxiety.  He will talk to his primary care for labs.  Will also leave the appointment to neurology for repeat MRI.  Discussed medication side effects and benefits.  Encourage walking, exercise, continue gym.  Will follow-up in 2 months.  Brief psychotherapy provided at this visit.   Follow Up Instructions:     I discussed the assessment and treatment plan with the patient. The patient was provided an opportunity to ask questions and all were answered. The patient agreed with the plan and demonstrated  an understanding of the instructions.   The patient was advised to call back or seek an in-person evaluation if the symptoms worsen or if the condition fails to improve as anticipated.    Collaboration of Care: Other provider involved in patient's care AEB notes are available in epic to review  Patient/Guardian was advised Release of Information must be obtained prior to any record release in order to collaborate their care with an outside provider. Patient/Guardian was advised if they have not already done so to contact the registration department to sign all necessary forms in order for [place] to release information regarding their care.   Consent: Patient/Guardian gives verbal consent for treatment and assignment of benefits for services provided during this visit. Patient/Guardian expressed understanding and agreed to proceed.     I provided 28 minutes of non face  to face time during this encounter.  Note: This document was prepared by [employer] voice dictation technology and any errors that results from this process are unintentional.    Cleotis Nipper, MD 10/24/2023

## 2023-10-26 ENCOUNTER — Encounter: Payer: Self-pay | Admitting: Family

## 2023-10-26 ENCOUNTER — Ambulatory Visit (INDEPENDENT_AMBULATORY_CARE_PROVIDER_SITE_OTHER): Payer: No Typology Code available for payment source | Admitting: Family

## 2023-10-26 VITALS — BP 122/83 | HR 93 | Temp 98.3°F | Ht 67.0 in | Wt 277.4 lb

## 2023-10-26 DIAGNOSIS — N529 Male erectile dysfunction, unspecified: Secondary | ICD-10-CM | POA: Diagnosis not present

## 2023-10-26 DIAGNOSIS — R0681 Apnea, not elsewhere classified: Secondary | ICD-10-CM

## 2023-10-26 DIAGNOSIS — E10A1 Type 1 diabetes mellitus, presymptomatic, stage 1: Secondary | ICD-10-CM | POA: Insufficient documentation

## 2023-10-26 DIAGNOSIS — R7303 Prediabetes: Secondary | ICD-10-CM | POA: Insufficient documentation

## 2023-10-26 DIAGNOSIS — Z720 Tobacco use: Secondary | ICD-10-CM | POA: Insufficient documentation

## 2023-10-26 DIAGNOSIS — Z8661 Personal history of infections of the central nervous system: Secondary | ICD-10-CM

## 2023-10-26 DIAGNOSIS — Z833 Family history of diabetes mellitus: Secondary | ICD-10-CM | POA: Insufficient documentation

## 2023-10-26 DIAGNOSIS — G060 Intracranial abscess and granuloma: Secondary | ICD-10-CM

## 2023-10-26 MED ORDER — WEGOVY 0.25 MG/0.5ML ~~LOC~~ SOAJ: 0.2500 mg | SUBCUTANEOUS | 1 refills | Status: DC

## 2023-10-26 NOTE — Patient Instructions (Addendum)
 Welcome to [facility], It was a pleasure meeting you today!    As discussed, I have sent a referral to Infectious disease, Urology and Neurology (sleep center). They will call you to schedule.  Let Dr Curry know about your erectile dysfunction as most likely a medication side effect and see what he thinks, as sometimes Wellbutrin  is used to counter this side effect.  I have sent in Wegovy  to your pharmacy, but will require authorization from your insurance co first before you can pick it up, we will let you know.   Please schedule a 1 month follow up visit after you start the Wegovy .  Start working on what we discussed today regarding your weight loss goals:  1) Eat small portions, ok to eat 6 mini meals vs. 3 big meals if this controls your hunger  better. 2) Eat until full and then STOP! This is your body telling you it has had enough. 3) Drink at least 64 oz water = 2 liters = 8, 8oz cups DAILY. 4) Eat most of your calories earlier in the day (if you work during the day).  Want to eat within a 8-10hour window if possible. No eating after 7pm, only no calorie drinks or water from 7p until bedtime. 5) Look for low carb, high protein foods/recipes. Avoid simple carbs including sweets, white bread, rice, potatoes, pasta. Look for whole grain/whole wheat/or almond flour if eating these foods. 6) High protein foods: meats (limit red meat), including turkey, chicken, pork, FISH (best source) nuts, cheese, beans (black beans, soy/edamame beans are best). Protein drinks, bars are also good but must have low sugar, look for < 10 grams. 7) Avoid processed/refined sugar and artificial sweeteners if possible. Stevia, Erythritol, or Monk fruit sweetener are preferred, natural, no calorie sweeteners.  Natural sweeteners like Agave or honey in very small amounts are ok also.   PLEASE NOTE: If you had any LAB tests please let us  know if you have not heard back within a few  days. You may see your results on MyChart before we have a chance to review them but we will give you a call once they are reviewed by us . If we ordered any REFERRALS today, please let us  know if you have not heard from their office within the next week.  Let us  know through MyChart if you are needing REFILLS, or have your pharmacy send us  the request. You can also use MyChart to communicate with me or any office staff.

## 2023-10-26 NOTE — Progress Notes (Signed)
 New Patient Office Visit  Subjective:  Patient ID: Devin Barker, male    DOB: 05/10/1996  Age: 28 y.o. MRN: [id number]  CC:  Chief Complaint  Patient presents with   New Patient (Initial Visit)   abnormal weight gain    Pt would like to discuss abnormal weight gain and discuss injectables.    ADHD    Pt would like to discuss starting back on Adderall due to starting school but would like MRI done first. 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    Erectile Dysfunction    Pt c/o erectile dysfunction, he is unable to last long.    HPI Devin Barker presents for establishing care today.  Discussed the use of AI scribe software for clinical note transcription with the patient, who gave verbal consent to proceed.  History of Present Illness   Devin Barker is a 28 year old male with Bipolar, ADHD, obesity, and erectile dysfunction who presents to establish care. He has ADHD and is currently taking Strattera , which he feels is not effectively managing his symptoms. He is a paramedic working full-time and is experiencing difficulty managing his responsibilities. He is also preparing for his upcoming wedding in a month, which adds to his stress. He is experiencing erectile dysfunction, specifically difficulty maintaining an erection after the first intercourse. He has been with his fiance for five years and notes that this issue has been more prominent lately. No pain, swelling, or irritation in the scrotum, and there are no issues with libido. He is currently on gabapentin  for anxiety and Abilify  for Bipolar, has been on various medications over the past year, but the erectile dysfunction has persisted even before starting these medications. He reports a possible sleep apnea issue, as noted by friends who have observed loud breathing and possible apneic episodes during sleep. He smokes cigarettes and experiences frequent coughing. He  has concerns about his weight, which has remained stable but has been a lifelong issue. He has attempted various weight loss strategies, including diet and exercise, without significant success. He has a family history of diabetes, with his mother, maternal grandmother, and other relatives affected by the condition. He had an A1C checked in September which is 5.9, borderline DM. He has a history of an intracranial abscess, previously treated by the infectious disease department, and has not had a follow-up since last year. He reports memory problems and would like to start a stimulant for his ADHD, but his Psych stated he needed an updated MRI done first.      Assessment & Plan:     Erectile Dysfunction - Recent onset, no pain or swelling. Currently on Strattera , Abilify  & Gabapentin  which may contribute to the issue, but it has taken a long time to get to the right mix of meds and he does not want to change them. He also reports having the issue prior to starting the medications. -Advised to communicate with Dr. Curry regarding the issue and the possibility of adding Wellbutrin  to counteract side effects. -Sending referral to urology if no improvement or if Dr. Curry does not want to adjust medication.  Weight Management/Prediabetes - Patient has a strong family history of diabetes and is concerned about weight gain despite efforts to lose weight. Currently on psych meds that may be contributing. -Submit prior authorization for Wegovy  for weight loss. Follow up in one month if approved.  -Advised on use & SE of Wegovy .  Sleep Apnea evaluation -  Reports of loud breathing and possible cessation of breathing during sleep. -Refer to sleep medicine for evaluation of possible sleep apnea.  History of Osteomyelitis/Intracranial Abscess - No current signs of infection. Needs new MRI before his Psych will consider prescribing stimulant meds for his ADHD. -Refer back to infectious disease for  re-evaluation and possible MRI.  Smoking - Patient reports coughing associated with smoking. -Encouraged smoking cessation for overall health improvement.      Subjective:    Outpatient Medications Prior to Visit  Medication Sig Dispense Refill   ARIPiprazole  (ABILIFY ) 15 MG tablet Take 0.5 tablets (7.5 mg total) by mouth daily. 15 tablet 1   atomoxetine  (STRATTERA ) 18 MG capsule Take 1 capsule (18 mg total) by mouth daily. 30 capsule 1   cyanocobalamin  (VITAMIN B12) 1000 MCG tablet Take 1 tablet (1,000 mcg total) by mouth daily. 90 tablet 3   gabapentin  (NEURONTIN ) 300 MG capsule Take 1 capsule (300 mg total) by mouth 3 (three) times daily. Take one capsule twice a day and two capsule at bed time - may take one extra a day for anxiety. 90 capsule 1   hydrOXYzine  (ATARAX ) 10 MG tablet Take 1 tablet (10 mg total) by mouth 2 (two) times daily as needed for anxiety. 60 tablet 1   No facility-administered medications prior to visit.   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   Status post surgery 07/31/2022   Past Surgical History:  Procedure Laterality Date   LUMBAR WOUND DEBRIDEMENT Right 07/31/2022   Procedure: Right Craniectomy for Mass Resection;  Surgeon: Gillie Duncans, MD;  Location: [facility] OR;  Service: Neurosurgery;  Laterality: Right;   MEDIAL PATELLOFEMORAL LIGAMENT REPAIR Right 10/16/2013   Procedure: ARTHROSCOPY, RIGHT OPEN RECONSTRUCTION AT THE MEDIAL PATELLA FEMORAL LIGAMENT;  Surgeon: Franky CHRISTELLA Pointer, MD;  Location: [facility] OR;  Service: Orthopedics;  Laterality: Right;  ANESTHESIA: GENERAL/FEMORAL NERVE BLOCK   TONSILLECTOMY      Objective:   Today's Vitals: BP 122/83 (BP Location: Left Arm, Patient Position: Sitting, Cuff Size: Large)   Pulse 93   Temp 98.3 F (36.8 C) (Temporal)   Ht  5' 7 (1.702 m)   Wt 277 lb 6.4 oz (125.8 kg)   SpO2 98%   BMI 43.45 kg/m   Physical Exam Vitals and nursing note reviewed.  Constitutional:      General: He is not in acute distress.    Appearance: Normal appearance. He is morbidly obese.  HENT:     Head: Normocephalic.  Cardiovascular:     Rate and Rhythm: Normal rate and regular rhythm.  Pulmonary:     Effort: Pulmonary effort is normal.     Breath sounds: Normal breath sounds.  Musculoskeletal:        General: Normal range of motion.     Cervical back: Normal range of motion.  Skin:    General: Skin is warm and dry.  Neurological:     Mental Status: He is alert and oriented to person, place, and time.  Psychiatric:        Mood and Affect: Mood normal.    Meds ordered this encounter  Medications   Semaglutide -Weight Management (WEGOVY ) 0.25 MG/0.5ML SOAJ    Sig: Inject 0.25 mg into the skin once a week.    Dispense:  2 mL  Refill:  1    Supervising Provider:   ANDY, CAMILLE L [[id number]]    Lucius Krabbe, NP

## 2023-10-28 NOTE — Assessment & Plan Note (Signed)
 No current signs of infection. Needs new MRI before his Psych will consider prescribing stimulant meds for his ADHD. -Refer back to infectious disease for re-evaluation and possible MRI.

## 2023-10-28 NOTE — Assessment & Plan Note (Signed)
 Patient reports coughing associated with smoking. -Encouraged smoking cessation for overall health improvement.

## 2023-10-28 NOTE — Assessment & Plan Note (Signed)
 Patient has a strong family history of diabetes and is concerned about weight gain despite efforts to lose weight. Currently on psych meds that may be contributing. -Submit prior authorization for Wegovy  for weight loss. Follow up in one month if approved.  -Advised on use & SE of Wegovy .

## 2023-10-28 NOTE — Assessment & Plan Note (Signed)
 Recent onset, no pain or swelling. Currently on Strattera , Abilify  & Gabapentin  which may contribute to the issue, but it has taken a long time to get to the right mix of meds and he does not want to change them. He also reports having the issue prior to starting the medications. -Advised to communicate with Dr. Curry regarding the issue and the possibility of adding Wellbutrin  to counteract side effects. -Sending referral to urology if no improvement or if Dr. Curry does not want to adjust medication.

## 2023-10-29 ENCOUNTER — Telehealth (HOSPITAL_COMMUNITY): Payer: Self-pay

## 2023-10-29 NOTE — Telephone Encounter (Signed)
 He needs to see primary care.

## 2023-10-29 NOTE — Telephone Encounter (Signed)
 Patient is calling today to report he is having an issue with erectile dysfunction - It was difficult to hear the patient but he said he had discussed with you previously. Please review and advise, thank you

## 2023-10-31 NOTE — Telephone Encounter (Signed)
I reviewed the notes from PCP.  He he had erectile dysfunction prior to taking psychotropic medication.  I could try Wellbutrin XL 150 mg in the morning and he can stop the Strattera as Wellbutrin also helps ADHD symptoms and beneficial for his sexual side effects if psych medication causing it.  Other option is to see urology for erectile dysfunction.  If agree please discontinue Strattera and try Wellbutrin XL 150 mg in the morning.

## 2023-10-31 NOTE — Telephone Encounter (Signed)
I called the patient and he said that it was his PCP that told him to call you because she thinks it is from his medication, please advise, thank you

## 2023-11-01 ENCOUNTER — Telehealth: Payer: Self-pay

## 2023-11-01 ENCOUNTER — Other Ambulatory Visit (HOSPITAL_COMMUNITY): Payer: Self-pay

## 2023-11-01 ENCOUNTER — Other Ambulatory Visit: Payer: Self-pay

## 2023-11-01 ENCOUNTER — Ambulatory Visit (INDEPENDENT_AMBULATORY_CARE_PROVIDER_SITE_OTHER): Payer: No Typology Code available for payment source | Admitting: Internal Medicine

## 2023-11-01 VITALS — BP 120/77 | HR 94 | Temp 98.8°F | Wt 278.0 lb

## 2023-11-01 DIAGNOSIS — G060 Intracranial abscess and granuloma: Secondary | ICD-10-CM | POA: Diagnosis not present

## 2023-11-01 MED ORDER — ALPRAZOLAM 0.5 MG PO TABS: 0.5000 mg | ORAL_TABLET | Freq: Once | ORAL | 0 refills | Status: DC | PRN

## 2023-11-01 NOTE — Telephone Encounter (Signed)
Copied from CRM [id number]. Topic: Clinical - Medication Question >> Nov 01, 2023 10:53 AM Almira Coaster wrote: Reason for CRM: Patient is calling to get an update on his Semaglutide-Weight Management (WEGOVY) 0.25 MG/0.5ML SOAJ prescription and prior authorization status. Best call back number [phone].  Please start PA for pt.  Thank you!

## 2023-11-01 NOTE — Patient Instructions (Signed)
I have ordered brain mri at your team request   You do not have clinically evidence of infection  No need to follow up with our id clinic   I'll communicate via mychart once mri is read  Thank you

## 2023-11-01 NOTE — Telephone Encounter (Signed)
Pharmacy Patient Advocate Encounter   Received notification from Pt Calls Messages that prior authorization for Wegovy 0.25mg /0.77ml is required/requested.   Insurance verification completed.   The patient is insured through  [employer]  .   Per test claim: PA required; PA submitted to above mentioned insurance via CoverMyMeds Key/confirmation #/EOC [id number] Status is pending

## 2023-11-01 NOTE — Progress Notes (Addendum)
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   ATOMOXETINE (STRATTERA) 18 MG CAPSULE    Take 1 capsule (18 mg total) by mouth daily.   CYANOCOBALAMIN (VITAMIN B12) 1000 MCG TABLET    Take 1 tablet (1,000 mcg total) by mouth daily.   GABAPENTIN (NEURONTIN) 300 MG CAPSULE    Take 1 capsule (300 mg total) by mouth 3 (three) times daily. Take one capsule twice a day and two capsule at bed time - may take one extra a day for anxiety.   SEMAGLUTIDE-WEIGHT MANAGEMENT (WEGOVY) 0.25 MG/0.5ML SOAJ    Inject 0.25 mg into the skin once a week.  Modified Medications   No medications on file  Discontinued Medications   No medications on file    Subjective: [name] with  past medical history of IVDA, knee dislocation, obesity presents for hospital follow-up of skull abscess status post right craniectomy with massive sections, cultures growing P acnes.  Patient reports there was a mass there for a couple months with headache.  He noted the mass after a fall.  MRI showed peripheral enhancing collection along the right frontal lobe concerning for abscess versus neoplasm.  Started on vancomycin, cefepime, metronidazole.Taken to OR on 11/14 for right craniectomy with NSY.  Cultures  eventually grew P acnes.  Patient was transitioned to penicillin.  Given his a history of IVDA he was discharged on linezolid x 1 month.  He received IV antibiotics while hospitalized 11/14-12/5/23.   09/21/22:Pt reports feel emotionally labile. He reports IVDA was prior to hospitalization. Pt has not gone back to work.  Missed a couple of doses of abx.  10/17/22: Pt reports he has appointment with behavioral health. He denies HA, fever and chills  Today 12/04/22: Doing well. Stopped linezolid. No new complaints.   ------------ 11/01/23 id clinic f/u Patient referred by pcp back here as there is plan by mental health provider to start adhd/psychiatric meds and they want a brain mri to assess for ongoing infection  I reviewed epic/pcp's note and prior id clinic notes; last seen ID 11/2022  Clinically no f/c. He has been a year out from treatment so I am not worried about any cranial infection at this time  However, will get brain mri at team's request   Review of Systems: Review of Systems  All other systems reviewed and are negative.   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   Status post surgery 07/31/2022    Social History   Tobacco Use   Smoking  status: Every Day    Current packs/day: 1.00    Types: Cigarettes   Smokeless tobacco: Former    Types: Engineer, drilling   Vaping status: Never Used  Substance Use Topics   Alcohol use: Yes    Comment: occasionally   Drug use: Yes    Types: Marijuana    Comment: last dose yesterday    Family History  Problem Relation Age of Onset   Diabetes Mother    Hyperlipidemia Mother    Hypertension Father    Kidney disease Sister    Diabetes Maternal Grandmother    Diabetes Paternal Grandfather     Allergies  Allergen Reactions   Other      Pt prefers medications that do not have gelatins. No capsules, gel caps etc....due to religious reasons  Pt also had a reaction to laughing gas (liquid form)     Health Maintenance  Topic Date Due   COVID-19 Vaccine (1) 11/11/2023 (Originally 12/11/2001)   INFLUENZA VACCINE  12/17/2023 (Originally 04/19/2023)   Pneumococcal Vaccine 45-24 Years old (1 of 2 - PCV) 10/25/2024 (Originally 12/12/2002)   HPV VACCINES (1 - Male 3-dose series) 10/25/2024 (Originally 12/12/2011)   DTaP/Tdap/Td (2 - Td or Tdap) 07/31/2032   Hepatitis C Screening  Completed   HIV Screening  Completed    Objective:  Vitals:   11/01/23 1532  BP: 120/77  Pulse: 94  Temp: 98.8 F (37.1 C)  TempSrc: Oral  SpO2: 94%  Weight: 278 lb (126.1 kg)   Body mass index is 43.54 kg/m.  Physical Exam Constitutional:      General: He is not in acute distress.    Appearance: He is normal weight. He is not toxic-appearing.  HENT:     Head: Normocephalic and atraumatic.     Right Ear: External ear normal.     Left Ear: External ear normal.     Nose: No congestion or rhinorrhea.     Mouth/Throat:     Mouth: Mucous membranes are moist.     Pharynx: Oropharynx is clear.  Eyes:     Extraocular Movements: Extraocular movements intact.     Conjunctiva/sclera: Conjunctivae normal.     Pupils: Pupils are equal, round, and reactive to light.  Cardiovascular:     Rate and Rhythm: Normal rate and regular rhythm.     Heart sounds: No murmur heard.    No friction rub. No gallop.  Pulmonary:     Effort: Pulmonary effort is normal.     Breath sounds: Normal breath sounds.  Abdominal:     General: Abdomen is flat. Bowel sounds are normal.     Palpations: Abdomen is soft.  Musculoskeletal:        General: No swelling. Normal range of motion.     Cervical back: Normal range of motion and neck supple.  Skin:    General: Skin is warm and dry.  Neurological:     General: No focal deficit present.     Mental Status: He is  oriented to person, place, and time.  Psychiatric:        Mood and Affect: Mood normal.     Lab Results Lab Results  Component Value Date   WBC 14.3 (H) 05/30/2023   HGB 14.7 05/30/2023   HCT 46.4 05/30/2023   MCV 80 05/30/2023   PLT 550 (H) 05/30/2023    Lab Results  Component Value Date   CREATININE 0.76 05/30/2023   BUN 7 05/30/2023   NA 138  05/30/2023   K 4.7 05/30/2023   CL 100 05/30/2023   CO2 24 05/30/2023    Lab Results  Component Value Date   ALT 16 05/30/2023   AST 16 05/30/2023   ALKPHOS 79 05/30/2023   BILITOT <0.2 05/30/2023    Lab Results  Component Value Date   CHOL 157 04/06/2022   HDL 60 04/06/2022   LDLCALC 84 04/06/2022   TRIG 57 04/06/2022   CHOLHDL 2.6 04/06/2022   Lab Results  Component Value Date   LABRPR NON REACTIVE 08/01/2022   No results found for: "HIV1RNAQUANT", "HIV1RNAVL", "CD4TABS"   Problem List Items Addressed This Visit   None Visit Diagnoses       Brain abscess    -  Primary   Relevant Orders   MR BRAIN W WO CONTRAST      Assessment/Plan #Persistent leukocytosis-likely was drug related as eos >3k on linezolid # 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 -Repeat MRI seem stable -Given P acnes and leukocytosis. did a prolonged course aof abx. Repeat MRI on 2/2 showed no evidence of  intracranial infection. Linezolid stopped. Plan: -cbc today(f/u on wbc) -F/U PRN  ------------------- Patient is one year removed from finish several months of antibiotics for skull based and intracranial abscess. Cx p-acnes  He clinically doesn't have evidence of any relapse  Brain mri ordered  No need for f/u  I'll communicate via mychart with patient about result  F/u pcp/psychiatric providers per  their discretion  Patient wants a xanax prior to procedure and rx given   Raymondo Band, MD [facility] [place] 11/01/2023, 3:41 PM

## 2023-11-01 NOTE — Addendum Note (Signed)
Addended byRutha Bouchard T on: 11/01/2023 03:55 PM   Modules accepted: Orders

## 2023-11-02 ENCOUNTER — Other Ambulatory Visit (HOSPITAL_COMMUNITY): Payer: Self-pay

## 2023-11-02 MED ORDER — BUPROPION HCL ER (XL) 150 MG PO TB24: 150.0000 mg | ORAL_TABLET | Freq: Every day | ORAL | 0 refills | Status: DC

## 2023-11-02 NOTE — Telephone Encounter (Signed)
I called patient and he is willing to stop the Strattera and start Wellbutrin. 30 day rx was sent to his pharmacy

## 2023-11-04 ENCOUNTER — Ambulatory Visit
Admission: EM | Admit: 2023-11-04 | Discharge: 2023-11-04 | Disposition: A | Discharge status: Home / Self Care | Payer: No Typology Code available for payment source | Attending: Family Medicine | Admitting: Family Medicine

## 2023-11-04 DIAGNOSIS — J101 Influenza due to other identified influenza virus with other respiratory manifestations: Secondary | ICD-10-CM | POA: Diagnosis not present

## 2023-11-04 LAB — POC COVID19/FLU A&B COMBO
Covid Antigen, POC: NEGATIVE
Influenza A Antigen, POC: POSITIVE — AB
Influenza B Antigen, POC: NEGATIVE

## 2023-11-04 MED ORDER — PROMETHAZINE-DM 6.25-15 MG/5ML PO SYRP
5.0000 mL | ORAL_SOLUTION | Freq: Three times a day (TID) | ORAL | 0 refills | Status: DC | PRN
Start: 2023-11-04 — End: 2024-04-15

## 2023-11-04 MED ORDER — OSELTAMIVIR PHOSPHATE 75 MG PO CAPS: 75.0000 mg | ORAL_CAPSULE | Freq: Two times a day (BID) | ORAL | 0 refills | Status: DC

## 2023-11-04 MED ORDER — ALBUTEROL SULFATE HFA 108 (90 BASE) MCG/ACT IN AERS: 1.0000 | INHALATION_SPRAY | Freq: Four times a day (QID) | RESPIRATORY_TRACT | 0 refills | Status: DC | PRN

## 2023-11-04 NOTE — ED Triage Notes (Signed)
Pt c/o body aches, nasal congestion, fever x 2 days-last dose advil 2-3 hours PTA-NAD-steady gait

## 2023-11-04 NOTE — Discharge Instructions (Signed)
Start Tamiflu for influenza. For sore throat or cough try using a honey-based tea. Use 3 teaspoons of honey with juice squeezed from half lemon. Place shaved pieces of ginger into 1/2-1 cup of water and warm over stove top. Then mix the ingredients and repeat every 4 hours as needed. Please take ibuprofen 600mg  every 6 hours with food alternating with OR taken together with Tylenol 500mg -650mg  every 6 hours for throat pain, fevers, aches and pains. Hydrate very well with at least 2 liters of water. Eat light meals such as soups (chicken and noodles, vegetable, chicken and wild rice).  Do not eat foods that you are allergic to.  Taking an antihistamine like Zyrtec (10mg  daily) can help against postnasal drainage, sinus congestion which can cause sinus pain, sinus headaches, throat pain, painful swallowing, coughing.  You can take this together with albuterol and cough syrup as needed.

## 2023-11-04 NOTE — ED Provider Notes (Signed)
[facility]  Note:  This document was prepared using Conservation officer, historic buildings and may include unintentional dictation errors.  MRN: [id number] DOB: [birth year]  Subjective:   Devin Barker is a 28 y.o. male presenting for 2-day history of acute onset persistent and worsening sinus congestion, fever, chest tightness, body pains.  Suspects he has asthma and would like an albuterol inhaler.  Patient is a smoker.  No current facility-administered medications for this encounter.  Current Outpatient Medications:    buPROPion (WELLBUTRIN XL) 150 MG 24 hr tablet, Take 1 tablet (150 mg total) by mouth daily., Disp: 30 tablet, Rfl: 0   ALPRAZolam (XANAX) 0.5 MG tablet, Take 1 tablet (0.5 mg total) by mouth once as needed for up to 1 dose for anxiety (take prior to mri procedure)., Disp: 1 tablet, Rfl: 0   ARIPiprazole (ABILIFY) 15 MG tablet, Take 0.5 tablets (7.5 mg total) by mouth daily., Disp: 15 tablet, Rfl: 1   atomoxetine (STRATTERA) 18 MG capsule, Take 1 capsule (18 mg total) by mouth daily., Disp: 30 capsule, Rfl: 1   cyanocobalamin (VITAMIN B12) 1000 MCG tablet, Take 1 tablet (1,000 mcg total) by mouth daily. (Patient not taking: Reported on 11/01/2023), Disp: 90 tablet, Rfl: 3   gabapentin (NEURONTIN) 300 MG capsule, Take 1 capsule (300 mg total) by mouth 3 (three) times daily. Take one capsule twice a day and two capsule at bed time - may take one extra a day for anxiety., Disp: 90 capsule, Rfl: 1   Semaglutide-Weight Management (WEGOVY) 0.25 MG/0.5ML SOAJ, Inject 0.25 mg into the skin once a week. (Patient not taking: Reported on 11/01/2023), Disp: 2 mL, Rfl: 1   Allergies  Allergen Reactions   Other     Pt prefers medications that do not have gelatins. 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   Status post surgery 07/31/2022     Past Surgical History:  Procedure Laterality Date   LUMBAR WOUND DEBRIDEMENT Right 07/31/2022   Procedure: Right Craniectomy for Mass Resection;  Surgeon: Coletta Memos, MD;  Location: [facility] OR;  Service: Neurosurgery;  Laterality: Right;   MEDIAL PATELLOFEMORAL LIGAMENT REPAIR Right 10/16/2013   Procedure: ARTHROSCOPY, RIGHT OPEN RECONSTRUCTION AT THE MEDIAL PATELLA FEMORAL LIGAMENT;  Surgeon: Senaida Lange, MD;  Location: [facility] OR;  Service: Orthopedics;  Laterality: Right;  ANESTHESIA: GENERAL/FEMORAL NERVE BLOCK   TONSILLECTOMY      Family History  Problem Relation Age of Onset   Diabetes Mother    Hyperlipidemia Mother    Hypertension Father    Kidney disease Sister    Diabetes Maternal Grandmother    Diabetes Paternal Grandfather     Social History   Tobacco Use   Smoking status: Every Day    Current packs/day: 1.00    Types: Cigarettes   Smokeless tobacco: Former    Types: Associate Professor status: Never Used  Substance Use Topics   Alcohol use: Not Currently   Drug use: Not Currently    Types: Marijuana    ROS   Objective:   Vitals: BP 125/78 (BP Location: Right Arm)   Pulse 92  Temp 98.1 F (36.7 C) (Oral)   Resp 20   SpO2 95%   Physical Exam Constitutional:      General: He is not in acute distress.    Appearance: Normal appearance. He is well-developed and normal weight. He is not ill-appearing, toxic-appearing or diaphoretic.  HENT:     Head: Normocephalic and atraumatic.     Right Ear: Tympanic membrane, ear canal and external ear normal. No drainage, swelling or tenderness. No middle ear effusion. There is no impacted cerumen. Tympanic membrane is not erythematous or bulging.     Left Ear: Tympanic membrane, ear canal and external ear normal. No  drainage, swelling or tenderness.  No middle ear effusion. There is no impacted cerumen. Tympanic membrane is not erythematous or bulging.     Nose: Nose normal. No congestion or rhinorrhea.     Mouth/Throat:     Mouth: Mucous membranes are moist.     Pharynx: No oropharyngeal exudate or posterior oropharyngeal erythema.  Eyes:     General: No scleral icterus.       Right eye: No discharge.        Left eye: No discharge.     Extraocular Movements: Extraocular movements intact.     Conjunctiva/sclera: Conjunctivae normal.  Cardiovascular:     Rate and Rhythm: Normal rate and regular rhythm.     Heart sounds: Normal heart sounds. No murmur heard.    No friction rub. No gallop.  Pulmonary:     Effort: Pulmonary effort is normal. No respiratory distress.     Breath sounds: Normal breath sounds. No stridor. No wheezing, rhonchi or rales.  Musculoskeletal:     Cervical back: Normal range of motion and neck supple. No rigidity. No muscular tenderness.  Neurological:     General: No focal deficit present.     Mental Status: He is alert and oriented to person, place, and time.  Psychiatric:        Mood and Affect: Mood normal.        Behavior: Behavior normal.        Thought Content: Thought content normal.     Results for orders placed or performed during the hospital encounter of 11/04/23 (from the past 24 hours)  POC Covid19/Flu A&B Antigen     Status: Abnormal   Collection Time: 11/04/23  8:52 AM  Result Value Ref Range   Influenza A Antigen, POC Positive (A) Negative   Influenza B Antigen, POC Negative Negative   Covid Antigen, POC Negative Negative    Assessment and Plan :   PDMP not reviewed this encounter.  1. Influenza A    Provided patient with albuterol inhaler.  Deferred imaging given clear cardiopulmonary exam, hemodynamically stable vital signs. Will cover for influenza with Tamiflu given + results.  Use supportive care, rest, fluids, hydration, light meals, schedule  Tylenol and ibuprofen. Counseled patient on potential for adverse effects with medications prescribed today, patient verbalized understanding. ER and return-to-clinic precautions discussed, patient verbalized understanding.    Wallis Bamberg, [place] 11/04/23 [phone]

## 2023-11-05 ENCOUNTER — Telehealth: Payer: Self-pay

## 2023-11-05 NOTE — Telephone Encounter (Signed)
Copied from CRM [id number]. Topic: Clinical - Prescription Issue >> Nov 05, 2023 12:11 PM Gaetano Hawthorne wrote: Reason for CRM: Patient is calling in to check on the status of the prior authorization on the [place]. Please call him with a status when you have a moment.  Contact Center Agent noted that it was pending as of 11/01/2023.     Any update on PA.

## 2023-11-06 NOTE — Telephone Encounter (Signed)
Pharmacy Patient Advocate Encounter  Received notification from  [employer]  that Prior Authorization for [facility] 0.25mg /0.68ml has been DENIED.  Full denial letter will be uploaded to the media tab. See denial reason below.

## 2023-11-07 ENCOUNTER — Ambulatory Visit (HOSPITAL_COMMUNITY)
Admission: RE | Admit: 2023-11-07 | Discharge: 2023-11-07 | Disposition: A | Discharge status: Home / Self Care | Payer: No Typology Code available for payment source | Source: Ambulatory Visit | Attending: Internal Medicine | Admitting: Internal Medicine

## 2023-11-07 DIAGNOSIS — G060 Intracranial abscess and granuloma: Secondary | ICD-10-CM | POA: Insufficient documentation

## 2023-11-07 MED ORDER — GADOBUTROL 1 MMOL/ML IV SOLN
10.0000 mL | Freq: Once | INTRAVENOUS | Status: AC | PRN
  Administered 2023-11-07: 10 mL via INTRAVENOUS

## 2023-11-07 NOTE — Telephone Encounter (Signed)
I guess [employer] does not cover wt loss meds - please let Oris Drone know.

## 2023-11-08 NOTE — Telephone Encounter (Signed)
 MyChart message sent to pt

## 2023-11-14 ENCOUNTER — Encounter: Payer: BLUE CROSS/BLUE SHIELD | Admitting: Internal Medicine

## 2023-11-15 ENCOUNTER — Telehealth: Payer: Self-pay

## 2023-11-15 NOTE — Telephone Encounter (Signed)
 Copied from CRM [id number]. Topic: General - Other >> Nov 12, 2023 10:44 AM Truddie Crumble wrote: Reason for CRM: patient called he want to know the next steps for his wegovy medication since it was denied

## 2023-11-21 ENCOUNTER — Telehealth (HOSPITAL_COMMUNITY): Payer: Self-pay

## 2023-11-21 DIAGNOSIS — F411 Generalized anxiety disorder: Secondary | ICD-10-CM

## 2023-11-21 DIAGNOSIS — F1910 Other psychoactive substance abuse, uncomplicated: Secondary | ICD-10-CM

## 2023-11-21 DIAGNOSIS — F902 Attention-deficit hyperactivity disorder, combined type: Secondary | ICD-10-CM

## 2023-11-21 DIAGNOSIS — F319 Bipolar disorder, unspecified: Secondary | ICD-10-CM

## 2023-11-21 NOTE — Telephone Encounter (Signed)
 Patient is calling to let you know that he will be out of town for his wedding for 2 months not 2 weeks. Patient would like enough medication to get through 2 months. Please review and advise, thank you

## 2023-11-22 ENCOUNTER — Other Ambulatory Visit (HOSPITAL_COMMUNITY): Payer: Self-pay

## 2023-11-22 DIAGNOSIS — F1910 Other psychoactive substance abuse, uncomplicated: Secondary | ICD-10-CM

## 2023-11-22 DIAGNOSIS — F319 Bipolar disorder, unspecified: Secondary | ICD-10-CM

## 2023-11-22 DIAGNOSIS — F411 Generalized anxiety disorder: Secondary | ICD-10-CM

## 2023-11-22 DIAGNOSIS — F902 Attention-deficit hyperactivity disorder, combined type: Secondary | ICD-10-CM

## 2023-11-22 MED ORDER — BUPROPION HCL ER (XL) 150 MG PO TB24: 150.0000 mg | ORAL_TABLET | Freq: Every day | ORAL | 0 refills | Status: DC

## 2023-11-22 MED ORDER — GABAPENTIN 300 MG PO CAPS: 300.0000 mg | ORAL_CAPSULE | Freq: Four times a day (QID) | ORAL | 0 refills | Status: DC

## 2023-11-22 MED ORDER — ARIPIPRAZOLE 15 MG PO TABS: 7.5000 mg | ORAL_TABLET | Freq: Every day | ORAL | 0 refills | Status: DC

## 2023-11-22 MED ORDER — ARIPIPRAZOLE 15 MG PO TABS: 7.5000 mg | ORAL_TABLET | Freq: Every day | ORAL | 45 refills | Status: DC

## 2023-11-22 NOTE — Telephone Encounter (Signed)
 I called patient and he has already left [place], he is in [place] and will call me back with a pharmacy to send his prescriptions to.

## 2023-11-22 NOTE — Telephone Encounter (Signed)
I sent his prescription

## 2023-11-22 NOTE — Telephone Encounter (Signed)
 I have been on the phone with the patient for the better part of an hour. Patient picked up his medications at the end of Feb. (A 30 day supply) and when I attempted to send the prescriptions to the pharmacy in [place] they said that they would not fill because his insurance will not pay. I explained that the patient is leaving the country and will need his medication, patient was willing to pay out of pocket. They refused to fill. Patient [facility] another [facility] was willing to fill the prescriptions and let patient pay cash. I needed Dr. Lolly Mustache to send in the Gabapentin because in [place] it is a controlled substance and they would not let me call it in. I discussed with Dr. Lolly Mustache and he will send in the Gabapentin. I called and let the patient know and he was very grateful as he was worried he would be without medication in another country.

## 2023-11-22 NOTE — Telephone Encounter (Signed)
 I sent his prescription to [facility].  Continue Wellbutrin and Abilify.  Discontinue Strattera since we giving him Wellbutrin.  Please inform the patient

## 2023-12-03 ENCOUNTER — Encounter: Payer: Self-pay | Admitting: Internal Medicine

## 2023-12-25 ENCOUNTER — Telehealth (HOSPITAL_BASED_OUTPATIENT_CLINIC_OR_DEPARTMENT_OTHER): Payer: BLUE CROSS/BLUE SHIELD | Admitting: Psychiatry

## 2023-12-25 ENCOUNTER — Encounter (HOSPITAL_COMMUNITY): Payer: Self-pay | Admitting: Psychiatry

## 2023-12-25 DIAGNOSIS — F902 Attention-deficit hyperactivity disorder, combined type: Secondary | ICD-10-CM

## 2023-12-25 DIAGNOSIS — F1910 Other psychoactive substance abuse, uncomplicated: Secondary | ICD-10-CM | POA: Diagnosis not present

## 2023-12-25 DIAGNOSIS — F319 Bipolar disorder, unspecified: Secondary | ICD-10-CM | POA: Diagnosis not present

## 2023-12-25 DIAGNOSIS — F411 Generalized anxiety disorder: Secondary | ICD-10-CM

## 2023-12-25 MED ORDER — ATOMOXETINE HCL 25 MG PO CAPS: 25.0000 mg | ORAL_CAPSULE | Freq: Every day | ORAL | 1 refills | Status: DC

## 2023-12-25 MED ORDER — ARIPIPRAZOLE 15 MG PO TABS: 7.5000 mg | ORAL_TABLET | Freq: Every day | ORAL | 1 refills | Status: DC

## 2023-12-25 MED ORDER — GABAPENTIN 300 MG PO CAPS: 300.0000 mg | ORAL_CAPSULE | Freq: Four times a day (QID) | ORAL | 1 refills | Status: DC

## 2023-12-25 NOTE — Progress Notes (Signed)
 [facility] MD Virtual Progress Note   Patient Location: In Car Provider Location: Home Office  I connect with patient by video and verified that I am speaking with correct person by using two identifiers. I discussed the limitations of evaluation and management by telemedicine and the availability of in person appointments. I also discussed with the patient that there may be a patient responsible charge related to this service. The patient expressed understanding and agreed to proceed.  Devin Barker [id number] 28 y.o.  12/25/2023 10:44 AM  History of Present Illness:  Patient is evaluated by video session.  He admitted things are going okay and he just recently back from [place] after he got married.  He is very happy and excited.  He is going back in the summer for his honeymoon.  His wife is not in the [place] and hoping that she will come soon.  Patient is back to his previous job and he is reported things are going very well.  He is in a [employer].  Denies any irritability, anger, mania, psychosis.  Recently had an MRI of the brain and he was told it is clear.  He admitted sometimes struggle with attention concentration and focus.  He is taking Strattera 18 mg.  So far no tremor or shakes or any EPS.  He reported it is helping but sometime the medicine does not last long.  He denies any agitation, anger, mania psychosis.  He feels proud that he is not doing drugs.  He is getting along with his family member.  His appetite is okay.  He lost weight but is not sure how much he had lost.  He started going to gym.  Denies any panic attack.  Patient had complained about sexual side effects and we recommend to try Wellbutrin but he did not picked up this medication.  Past Psychiatric History: H/O poor impulse control, highs and lows and paranoia. H/O using cocaine, IVDA, crystal meth and multiple rehab.  Diagnosed with ADHD and given Adderall by a local  psychiatrist [place].  History of overdose in 2019 when he was living in sedan. Given fluoxetine, amitriptyline and Tegretol overseas but stopped the medicine in [place] when it did not work.    Recent Results (from the past 2160 hours)  POC Covid19/Flu A&B Antigen     Status: Abnormal   Collection Time: 11/04/23  8:52 AM  Result Value Ref Range   Influenza A Antigen, POC Positive (A) Negative   Influenza B Antigen, POC Negative Negative   Covid Antigen, POC Negative Negative     Psychiatric Specialty Exam: Physical Exam  Review of Systems  There were no vitals taken for this visit.There is no height or weight on file to calculate BMI.  General Appearance: Casual  Eye Contact:  Good  Speech:   fast  Volume:  Increased  Mood:  Euphoric  Affect:  Labile  Thought Process:  Descriptions of Associations: Intact  Orientation:  Full (Time, Place, and Person)  Thought Content:  Logical  Suicidal Thoughts:  No  Homicidal Thoughts:  No  Memory:  Immediate;   Good Recent;   Good Remote;   Fair  Judgement:  Intact  Insight:  Present  Psychomotor Activity:  Increased  Concentration:  Concentration: Fair and Attention Span: Fair  Recall:  [employer] of Knowledge:  Fair  Language:  Good  Akathisia:  No  Handed:  Right  AIMS (if indicated):  Assets:  Communication Skills Desire for Improvement Housing Resilience Social Support Talents/Skills Transportation  ADL's:  Intact  Cognition:  WNL  Sleep:  better     Assessment/Plan: Bipolar I disorder (HCC) - Plan: ARIPiprazole (ABILIFY) 15 MG tablet, gabapentin (NEURONTIN) 300 MG capsule  Attention deficit hyperactivity disorder (ADHD), combined type - Plan: atomoxetine (STRATTERA) 25 MG capsule, gabapentin (NEURONTIN) 300 MG capsule  GAD (generalized anxiety disorder) - Plan: gabapentin (NEURONTIN) 300 MG capsule  Polysubstance abuse (HCC) - Plan: gabapentin (NEURONTIN) 300 MG capsule  I reviewed this  medication.  He is not taking Wellbutrin which was recommended for sexual side effects.  He feels things are better however may consider once his wife comes [place].  His struggle with attention and focus sometimes.  I recommend to increase the Strattera and take 25 mg daily.  He is now taking gabapentin 4 times a day which is keeping his anxiety stable.  He does not have craving for drugs.  Continue gabapentin 300 mg 4 times a day, Abilify 7.5 mg daily and we will increase Strattera 25 mg daily.  I reviewed records from other providers.  Encouraged to continue gym and exercise.  He is happy since he started his previous job.  I encouraged monitor his weight.  Follow-up in 2 months.   Follow Up Instructions:     I discussed the assessment and treatment plan with the patient. The patient was provided an opportunity to ask questions and all were answered. The patient agreed with the plan and demonstrated an understanding of the instructions.   The patient was advised to call back or seek an in-person evaluation if the symptoms worsen or if the condition fails to improve as anticipated.    Collaboration of Care: Other provider involved in patient's care AEB notes are available in epic to review  Patient/Guardian was advised Release of Information must be obtained prior to any record release in order to collaborate their care with an outside provider. Patient/Guardian was advised if they have not already done so to contact the registration department to sign all necessary forms in order for [place] to release information regarding their care.   Consent: Patient/Guardian gives verbal consent for treatment and assignment of benefits for services provided during this visit. Patient/Guardian expressed understanding and agreed to proceed.     I provided 29 minutes of non face to face time during this encounter.  Note: This document was prepared by [employer] voice dictation technology and any errors that results from  this process are unintentional.    Cleotis Nipper, MD 12/25/2023

## 2023-12-28 ENCOUNTER — Emergency Department (HOSPITAL_COMMUNITY)

## 2023-12-28 ENCOUNTER — Emergency Department (HOSPITAL_COMMUNITY)
Admission: EM | Admit: 2023-12-28 | Discharge: 2023-12-28 | Disposition: A | Discharge status: Home / Self Care | Attending: Emergency Medicine | Admitting: Emergency Medicine

## 2023-12-28 DIAGNOSIS — S0990XA Unspecified injury of head, initial encounter: Secondary | ICD-10-CM | POA: Insufficient documentation

## 2023-12-28 DIAGNOSIS — S3991XA Unspecified injury of abdomen, initial encounter: Secondary | ICD-10-CM | POA: Insufficient documentation

## 2023-12-28 DIAGNOSIS — M79605 Pain in left leg: Secondary | ICD-10-CM | POA: Diagnosis not present

## 2023-12-28 DIAGNOSIS — S299XXA Unspecified injury of thorax, initial encounter: Secondary | ICD-10-CM | POA: Diagnosis not present

## 2023-12-28 DIAGNOSIS — Z794 Long term (current) use of insulin: Secondary | ICD-10-CM | POA: Diagnosis not present

## 2023-12-28 DIAGNOSIS — S7012XA Contusion of left thigh, initial encounter: Secondary | ICD-10-CM | POA: Insufficient documentation

## 2023-12-28 DIAGNOSIS — F0781 Postconcussional syndrome: Secondary | ICD-10-CM | POA: Diagnosis not present

## 2023-12-28 DIAGNOSIS — Y92410 Unspecified street and highway as the place of occurrence of the external cause: Secondary | ICD-10-CM | POA: Diagnosis not present

## 2023-12-28 DIAGNOSIS — S79922A Unspecified injury of left thigh, initial encounter: Secondary | ICD-10-CM | POA: Diagnosis present

## 2023-12-28 LAB — COMPREHENSIVE METABOLIC PANEL WITH GFR
ALT: 16 U/L (ref 0–44)
AST: 20 U/L (ref 15–41)
Albumin: 3.4 g/dL — ABNORMAL LOW (ref 3.5–5.0)
Alkaline Phosphatase: 66 U/L (ref 38–126)
Anion gap: 11 (ref 5–15)
BUN: 8 mg/dL (ref 6–20)
CO2: 24 mmol/L (ref 22–32)
Calcium: 8.8 mg/dL — ABNORMAL LOW (ref 8.9–10.3)
Chloride: 103 mmol/L (ref 98–111)
Creatinine, Ser: 0.83 mg/dL (ref 0.61–1.24)
GFR, Estimated: >60 mL/min (ref >60)
Glucose, Bld: 75 mg/dL (ref 70–99)
Potassium: 3.8 mmol/L (ref 3.5–5.1)
Sodium: 138 mmol/L (ref 135–145)
Total Bilirubin: 0.4 mg/dL (ref 0.0–1.2)
Total Protein: 7.5 g/dL (ref 6.5–8.1)

## 2023-12-28 LAB — I-STAT CHEM 8, ED
BUN: 8 mg/dL (ref 6–20)
Calcium, Ion: 1.02 mmol/L — ABNORMAL LOW (ref 1.15–1.40)
Chloride: 104 mmol/L (ref 98–111)
Creatinine, Ser: 0.8 mg/dL (ref 0.61–1.24)
Glucose, Bld: 74 mg/dL (ref 70–99)
HCT: 49 % (ref 39.0–52.0)
Hemoglobin: 16.7 g/dL (ref 13.0–17.0)
Potassium: 3.7 mmol/L (ref 3.5–5.1)
Sodium: 139 mmol/L (ref 135–145)
TCO2: 26 mmol/L (ref 22–32)

## 2023-12-28 LAB — CBC
HCT: 49.3 % (ref 39.0–52.0)
Hemoglobin: 15.2 g/dL (ref 13.0–17.0)
MCH: 25.6 pg — ABNORMAL LOW (ref 26.0–34.0)
MCHC: 30.8 g/dL (ref 30.0–36.0)
MCV: 83 fL (ref 80.0–100.0)
Platelets: 521 10*3/uL — ABNORMAL HIGH (ref 150–400)
RBC: 5.94 MIL/uL — ABNORMAL HIGH (ref 4.22–5.81)
RDW: 16 % — ABNORMAL HIGH (ref 11.5–15.5)
WBC: 17.6 10*3/uL — ABNORMAL HIGH (ref 4.0–10.5)
nRBC: 0 % (ref 0.0–0.2)

## 2023-12-28 LAB — ETHANOL: Alcohol, Ethyl (B): <10 mg/dL (ref <10)

## 2023-12-28 MED ORDER — METHOCARBAMOL 500 MG PO TABS: 500.0000 mg | ORAL_TABLET | Freq: Three times a day (TID) | ORAL | 0 refills | Status: DC | PRN

## 2023-12-28 MED ORDER — ONDANSETRON HCL 4 MG/2ML IJ SOLN
4.0000 mg | Freq: Once | INTRAMUSCULAR | Status: AC
  Administered 2023-12-28: 4 mg via INTRAVENOUS
  Filled 2023-12-28: qty 2

## 2023-12-28 MED ORDER — IOHEXOL 350 MG/ML SOLN
75.0000 mL | Freq: Once | INTRAVENOUS | Status: AC | PRN
  Administered 2023-12-28: 75 mL via INTRAVENOUS

## 2023-12-28 MED ORDER — LACTATED RINGERS IV BOLUS
1000.0000 mL | Freq: Once | INTRAVENOUS | Status: AC
  Administered 2023-12-28: 1000 mL via INTRAVENOUS

## 2023-12-28 MED ORDER — IBUPROFEN 800 MG PO TABS
800.0000 mg | ORAL_TABLET | Freq: Once | ORAL | Status: AC
  Administered 2023-12-28: 800 mg via ORAL
  Filled 2023-12-28: qty 1

## 2023-12-28 MED ORDER — METHOCARBAMOL 500 MG PO TABS
500.0000 mg | ORAL_TABLET | Freq: Once | ORAL | Status: AC
  Administered 2023-12-28: 500 mg via ORAL
  Filled 2023-12-28: qty 1

## 2023-12-28 MED ORDER — MORPHINE SULFATE (PF) 4 MG/ML IV SOLN
4.0000 mg | Freq: Once | INTRAVENOUS | Status: AC
  Administered 2023-12-28: 4 mg via INTRAVENOUS
  Filled 2023-12-28: qty 1

## 2023-12-28 MED ORDER — IBUPROFEN 800 MG PO TABS: 800.0000 mg | ORAL_TABLET | Freq: Three times a day (TID) | ORAL | 0 refills | Status: AC

## 2023-12-28 NOTE — Discharge Instructions (Addendum)
 As we discussed, your CT scans are normal today  You likely have muscle strain   You also may have a concussion  You are expected to be stiff and sore for several days  Take Motrin for pain and Robaxin for muscle spasm  See your doctor for follow-up  Return to ER if you have severe vomiting or altered mental status or uncontrolled pain
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
## 2024-01-07 ENCOUNTER — Telehealth (HOSPITAL_COMMUNITY): Payer: Self-pay | Admitting: Psychiatry
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
## 2024-01-07 NOTE — Telephone Encounter (Signed)
 Rita Clark (IOP CM) advised.

## 2024-02-17 ENCOUNTER — Other Ambulatory Visit (HOSPITAL_COMMUNITY): Payer: Self-pay | Admitting: Psychiatry

## 2024-02-17 DIAGNOSIS — F319 Bipolar disorder, unspecified: Secondary | ICD-10-CM

## 2024-02-17 DIAGNOSIS — F1910 Other psychoactive substance abuse, uncomplicated: Secondary | ICD-10-CM

## 2024-02-17 DIAGNOSIS — F902 Attention-deficit hyperactivity disorder, combined type: Secondary | ICD-10-CM

## 2024-02-17 DIAGNOSIS — F411 Generalized anxiety disorder: Secondary | ICD-10-CM

## 2024-02-20 ENCOUNTER — Other Ambulatory Visit (HOSPITAL_COMMUNITY): Payer: Self-pay | Admitting: *Deleted

## 2024-02-20 DIAGNOSIS — F411 Generalized anxiety disorder: Secondary | ICD-10-CM

## 2024-02-20 DIAGNOSIS — F1910 Other psychoactive substance abuse, uncomplicated: Secondary | ICD-10-CM

## 2024-02-20 DIAGNOSIS — F319 Bipolar disorder, unspecified: Secondary | ICD-10-CM

## 2024-02-20 DIAGNOSIS — F902 Attention-deficit hyperactivity disorder, combined type: Secondary | ICD-10-CM

## 2024-02-20 MED ORDER — GABAPENTIN 300 MG PO CAPS: 300.0000 mg | ORAL_CAPSULE | Freq: Four times a day (QID) | ORAL | 0 refills | Status: DC

## 2024-03-02 ENCOUNTER — Other Ambulatory Visit (HOSPITAL_COMMUNITY): Payer: Self-pay | Admitting: Psychiatry

## 2024-03-02 DIAGNOSIS — F902 Attention-deficit hyperactivity disorder, combined type: Secondary | ICD-10-CM

## 2024-03-05 ENCOUNTER — Other Ambulatory Visit (HOSPITAL_COMMUNITY): Payer: Self-pay | Admitting: *Deleted

## 2024-03-05 DIAGNOSIS — F902 Attention-deficit hyperactivity disorder, combined type: Secondary | ICD-10-CM

## 2024-03-05 MED ORDER — ATOMOXETINE HCL 25 MG PO CAPS: 25.0000 mg | ORAL_CAPSULE | Freq: Every day | ORAL | 0 refills | Status: DC

## 2024-03-12 ENCOUNTER — Other Ambulatory Visit (HOSPITAL_COMMUNITY): Payer: Self-pay | Admitting: Psychiatry

## 2024-03-12 DIAGNOSIS — F319 Bipolar disorder, unspecified: Secondary | ICD-10-CM

## 2024-03-12 DIAGNOSIS — F411 Generalized anxiety disorder: Secondary | ICD-10-CM

## 2024-03-12 DIAGNOSIS — F1910 Other psychoactive substance abuse, uncomplicated: Secondary | ICD-10-CM

## 2024-03-12 DIAGNOSIS — F902 Attention-deficit hyperactivity disorder, combined type: Secondary | ICD-10-CM

## 2024-03-17 ENCOUNTER — Telehealth (HOSPITAL_COMMUNITY): Admitting: Psychiatry

## 2024-03-19 ENCOUNTER — Other Ambulatory Visit (HOSPITAL_COMMUNITY): Payer: Self-pay | Admitting: *Deleted

## 2024-03-19 DIAGNOSIS — F411 Generalized anxiety disorder: Secondary | ICD-10-CM

## 2024-03-19 DIAGNOSIS — F319 Bipolar disorder, unspecified: Secondary | ICD-10-CM

## 2024-03-19 DIAGNOSIS — F902 Attention-deficit hyperactivity disorder, combined type: Secondary | ICD-10-CM

## 2024-03-19 DIAGNOSIS — F1910 Other psychoactive substance abuse, uncomplicated: Secondary | ICD-10-CM

## 2024-03-19 MED ORDER — GABAPENTIN 300 MG PO CAPS: 300.0000 mg | ORAL_CAPSULE | Freq: Four times a day (QID) | ORAL | 0 refills | Status: DC

## 2024-03-25 ENCOUNTER — Other Ambulatory Visit (HOSPITAL_COMMUNITY): Payer: Self-pay | Admitting: Psychiatry

## 2024-03-25 DIAGNOSIS — F902 Attention-deficit hyperactivity disorder, combined type: Secondary | ICD-10-CM

## 2024-04-02 ENCOUNTER — Telehealth (HOSPITAL_COMMUNITY): Payer: Self-pay

## 2024-04-02 NOTE — Telephone Encounter (Signed)
 He is taking Strattera  for past few months.  Never complained before about nausea.  He need to wait for his next appointment with us  coming up in 10 days.  Other option is to lower the Strattera  18 mg which he had taken in the past.  No new medication for now until his appointment.

## 2024-04-02 NOTE — Telephone Encounter (Signed)
 Patient is calling about the Strattera , he said it is making him extremely nauseous, he would like to know if you can provide something for the nausea or try a different medication. Please review and advise, thank you

## 2024-04-08 NOTE — Telephone Encounter (Signed)
 Called patient back and lvm letting him know that this will be addressed at his next appt

## 2024-04-14 ENCOUNTER — Telehealth: Payer: Self-pay

## 2024-04-14 ENCOUNTER — Telehealth (HOSPITAL_BASED_OUTPATIENT_CLINIC_OR_DEPARTMENT_OTHER): Admitting: Psychiatry

## 2024-04-14 ENCOUNTER — Encounter (HOSPITAL_COMMUNITY): Payer: Self-pay | Admitting: Psychiatry

## 2024-04-14 VITALS — Wt 250.0 lb

## 2024-04-14 DIAGNOSIS — F1910 Other psychoactive substance abuse, uncomplicated: Secondary | ICD-10-CM

## 2024-04-14 DIAGNOSIS — F411 Generalized anxiety disorder: Secondary | ICD-10-CM

## 2024-04-14 DIAGNOSIS — F902 Attention-deficit hyperactivity disorder, combined type: Secondary | ICD-10-CM | POA: Diagnosis not present

## 2024-04-14 DIAGNOSIS — F319 Bipolar disorder, unspecified: Secondary | ICD-10-CM

## 2024-04-14 MED ORDER — GABAPENTIN 400 MG PO CAPS: 400.0000 mg | ORAL_CAPSULE | Freq: Four times a day (QID) | ORAL | 1 refills | Status: DC

## 2024-04-14 MED ORDER — GUANFACINE HCL ER 1 MG PO TB24: 1.0000 mg | ORAL_TABLET | Freq: Every day | ORAL | 1 refills | Status: DC

## 2024-04-14 MED ORDER — ARIPIPRAZOLE 15 MG PO TABS: 7.5000 mg | ORAL_TABLET | Freq: Every day | ORAL | 1 refills | Status: DC
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
## 2024-04-14 NOTE — Telephone Encounter (Signed)
 Copied from CRM [id number]. Topic: Clinical - Medication Question >> Apr 14, 2024  8:02 AM [name] wrote: Reason for CRM: Patient calling in regarding Semaglutide -Weight Management (WEGOVY ) 0.25 MG/0.5ML SOAJ , patient stated if he could change to Ozempic  he has good rx and will get a discount   Pt will need an office visit to discuss change of medication. Please schedule.  Thank you!

## 2024-04-15 ENCOUNTER — Ambulatory Visit (INDEPENDENT_AMBULATORY_CARE_PROVIDER_SITE_OTHER): Admitting: Family

## 2024-04-15 ENCOUNTER — Encounter: Payer: Self-pay | Admitting: Family

## 2024-04-15 ENCOUNTER — Encounter (INDEPENDENT_AMBULATORY_CARE_PROVIDER_SITE_OTHER): Payer: Self-pay

## 2024-04-15 DIAGNOSIS — Z833 Family history of diabetes mellitus: Secondary | ICD-10-CM

## 2024-04-15 DIAGNOSIS — R7303 Prediabetes: Secondary | ICD-10-CM

## 2024-04-15 NOTE — Progress Notes (Signed)
 "  Patient ID: Devin Barker, male    DOB: [birth year], 28 y.o.   MRN: [id number]  Chief Complaint  Patient presents with   Weight Loss  Discussed the use of AI scribe software for clinical note transcription with the patient, who gave verbal consent to proceed.  History of Present Illness Devin Barker is a 28 year old male who presents for weight management consultation.  Weight management concerns - Motivated to manage weight due to upcoming wedding in two months - Identifies emotional triggers for increased food consumption, sometimes d/t stress, sadness, or boredom  Physical activity and functional status - Work involves walking and physical activity - Spends significant time in car to avoid heat, limiting physical activity to approximately one hour during a three-hour work period - During physical activity, experiences sweating and increased heart rate  Barriers to weight management - Insurance [employer] [place] ) does not cover weight loss medications - Exploring alternative options for weight management support, including potential visits to specialists for guidance and a structured plan  Assessment & Plan Morbid Obesity Obesity requires lifestyle changes. Abdel's work schedule and lifestyle hinder weight management. Insurance covers specialist visits but not medications. Virtual visits considered. Discussed exercise andfood perception changes. - Refer to weight management specialist for monthly visits. - Wt. Loss strategies reviewed including portion control, less carbs including sweets, eating most of calories earlier in day, drinking 64oz water qd, and establishing daily exercise routine. - Encourage sustained exercise for up to  to increase heart rate b/w 120-140 - Advise on changing perceptions about food and finding non-food related pleasures. - Will continue to follow.   Subjective:    Outpatient  Medications Prior to Visit  Medication Sig Dispense Refill   albuterol  (VENTOLIN  HFA) 108 (90 Base) MCG/ACT inhaler Inhale 1-2 puffs into the lungs every 6 (six) hours as needed for wheezing or shortness of breath. 18 g 0   cyanocobalamin  (VITAMIN B12) 1000 MCG tablet Take 1 tablet (1,000 mcg total) by mouth daily. 90 tablet 3   gabapentin  (NEURONTIN ) 400 MG capsule Take 1 capsule (400 mg total) by mouth 4 (four) times daily. 120 capsule 1   guanFACINE  (INTUNIV ) 1 MG TB24 ER tablet Take 1 tablet (1 mg total) by mouth daily. 30 tablet 1   ibuprofen  (ADVIL ) 800 MG tablet Take 1 tablet (800 mg total) by mouth 3 (three) times daily. 21 tablet 0   ARIPiprazole  (ABILIFY ) 15 MG tablet Take 0.5 tablets (7.5 mg total) by mouth daily. (Patient not taking: Reported on 04/15/2024) 15 tablet 1   promethazine -dextromethorphan  (PROMETHAZINE -DM) 6.25-15 MG/5ML syrup Take 5 mLs by mouth 3 (three) times daily as needed for cough. (Patient not taking: Reported on 04/15/2024) 200 mL 0   Semaglutide -Weight Management (WEGOVY ) 0.25 MG/0.5ML SOAJ Inject 0.25 mg into the skin once a week. (Patient not taking: Reported on 04/15/2024) 2 mL 1   No facility-administered medications prior to visit.   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   Status post surgery 07/31/2022   Past Surgical History:  Procedure Laterality Date   LUMBAR WOUND DEBRIDEMENT Right 07/31/2022   Procedure: Right Craniectomy for Mass Resection;  Surgeon: Gillie Duncans, MD;  Location: [facility] OR;  Service: Neurosurgery;  Laterality: Right;   MEDIAL PATELLOFEMORAL LIGAMENT  REPAIR Right 10/16/2013   Procedure: ARTHROSCOPY, RIGHT OPEN RECONSTRUCTION AT THE MEDIAL PATELLA FEMORAL LIGAMENT;  Surgeon: Franky CHRISTELLA Pointer, MD;  Location: [facility] OR;  Service: Orthopedics;  Laterality:  Right;  ANESTHESIA: GENERAL/FEMORAL NERVE BLOCK   TONSILLECTOMY     Allergies  Allergen Reactions   Other     Pt prefers medications that do not have gelatins. No capsules, gel caps etc....due to religious reasons  Pt also had a reaction to laughing gas (liquid form)       Objective:    Physical Exam Vitals and nursing note reviewed.  Constitutional:      General: He is not in acute distress.    Appearance: Normal appearance. He is morbidly obese.  HENT:     Head: Normocephalic.  Cardiovascular:     Rate and Rhythm: Normal rate and regular rhythm.  Pulmonary:     Effort: Pulmonary effort is normal.     Breath sounds: Normal breath sounds.  Musculoskeletal:        General: Normal range of motion.     Cervical back: Normal range of motion.  Skin:    General: Skin is warm and dry.  Neurological:     Mental Status: He is alert and oriented to person, place, and time.  Psychiatric:        Mood and Affect: Mood normal.    BP 118/80 (BP Location: Left Arm, Patient Position: Sitting, Cuff Size: Large)   Pulse 76   Temp 98.1 F (36.7 C) (Temporal)   Ht 5' 7 (1.702 m)   Wt 277 lb 3.2 oz (125.7 kg)   SpO2 94%   BMI 43.42 kg/m  Wt Readings from Last 3 Encounters:  04/15/24 277 lb 3.2 oz (125.7 kg)  12/28/23 240 lb (108.9 kg)  11/01/23 278 lb (126.1 kg)      Lucius Krabbe, NP  "

## 2024-04-16 ENCOUNTER — Other Ambulatory Visit (HOSPITAL_COMMUNITY): Payer: Self-pay | Admitting: Psychiatry

## 2024-04-16 DIAGNOSIS — F902 Attention-deficit hyperactivity disorder, combined type: Secondary | ICD-10-CM

## 2024-04-16 DIAGNOSIS — F1910 Other psychoactive substance abuse, uncomplicated: Secondary | ICD-10-CM

## 2024-04-16 DIAGNOSIS — F411 Generalized anxiety disorder: Secondary | ICD-10-CM

## 2024-04-16 DIAGNOSIS — F319 Bipolar disorder, unspecified: Secondary | ICD-10-CM

## 2024-05-21 ENCOUNTER — Ambulatory Visit
Admission: EM | Admit: 2024-05-21 | Discharge: 2024-05-21 | Disposition: A | Discharge status: Home / Self Care | Attending: Family Medicine | Admitting: Family Medicine

## 2024-05-21 DIAGNOSIS — J208 Acute bronchitis due to other specified organisms: Secondary | ICD-10-CM

## 2024-05-21 DIAGNOSIS — F17200 Nicotine dependence, unspecified, uncomplicated: Secondary | ICD-10-CM

## 2024-05-21 MED ORDER — ALBUTEROL SULFATE HFA 108 (90 BASE) MCG/ACT IN AERS: 1.0000 | INHALATION_SPRAY | Freq: Four times a day (QID) | RESPIRATORY_TRACT | 0 refills | Status: AC | PRN

## 2024-05-21 MED ORDER — CETIRIZINE HCL 10 MG PO TABS
10.0000 mg | ORAL_TABLET | Freq: Every day | ORAL | 0 refills | Status: AC
Start: 2024-05-21 — End: ?

## 2024-05-21 MED ORDER — PSEUDOEPHEDRINE HCL 30 MG PO TABS
30.0000 mg | ORAL_TABLET | Freq: Three times a day (TID) | ORAL | 0 refills | Status: DC | PRN
Start: 2024-05-21 — End: 2024-06-09

## 2024-05-21 MED ORDER — PROMETHAZINE-DM 6.25-15 MG/5ML PO SYRP
5.0000 mL | ORAL_SOLUTION | Freq: Three times a day (TID) | ORAL | 0 refills | Status: DC | PRN
Start: 2024-05-21 — End: 2024-06-09

## 2024-05-21 NOTE — ED Triage Notes (Signed)
 Pt repost cough, chest congestion, body aches and nasal congestion x 3 days. Pt has not taken any meds fro complaint .

## 2024-05-21 NOTE — ED Provider Notes (Signed)
 [facility]  Note:  This document was prepared using Conservation officer, historic buildings and may include unintentional dictation errors.  MRN: [id number] DOB: October 31, 1995  Subjective:   Tawan Degroote is a 28 y.o. male presenting for 3-day history of chest tightness, difficulty taking a full breath, sinus congestion and coughing.  No fever, chest pain, wheezing.  Has a history of reactive airways but  does not currently have an inhaler.  Does not want a COVID test.  Smokes 1 PPD.  No current facility-administered medications for this encounter.  Current Outpatient Medications:    ARIPiprazole  (ABILIFY ) 15 MG tablet, Take by mouth., Disp: , Rfl:    albuterol  (VENTOLIN  HFA) 108 (90 Base) MCG/ACT inhaler, Inhale 1-2 puffs into the lungs every 6 (six) hours as needed for wheezing or shortness of breath., Disp: 18 g, Rfl: 0   cyanocobalamin  (VITAMIN B12) 1000 MCG tablet, Take 1 tablet (1,000 mcg total) by mouth daily., Disp: 90 tablet, Rfl: 3   gabapentin  (NEURONTIN ) 400 MG capsule, Take 1 capsule (400 mg total) by mouth 4 (four) times daily., Disp: 120 capsule, Rfl: 1   guanFACINE  (INTUNIV ) 1 MG TB24 ER tablet, Take 1 tablet (1 mg total) by mouth daily., Disp: 30 tablet, Rfl: 1   ibuprofen  (ADVIL ) 800 MG tablet, Take 1 tablet (800 mg total) by mouth 3 (three) times daily., Disp: 21 tablet, Rfl: 0   Allergies  Allergen Reactions   Other     Pt prefers medications that do not have gelatins. 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   Status post surgery 07/31/2022     Past Surgical History:  Procedure Laterality Date   LUMBAR WOUND DEBRIDEMENT Right  07/31/2022   Procedure: Right Craniectomy for Mass Resection;  Surgeon: Gillie Duncans, MD;  Location: [facility] OR;  Service: Neurosurgery;  Laterality: Right;   MEDIAL PATELLOFEMORAL LIGAMENT REPAIR Right 10/16/2013   Procedure: ARTHROSCOPY, RIGHT OPEN RECONSTRUCTION AT THE MEDIAL PATELLA FEMORAL LIGAMENT;  Surgeon: Franky CHRISTELLA Pointer, MD;  Location: [facility] OR;  Service: Orthopedics;  Laterality: Right;  ANESTHESIA: GENERAL/FEMORAL NERVE BLOCK   TONSILLECTOMY      Family History  Problem Relation Age of Onset   Diabetes Mother    Hyperlipidemia Mother    Hypertension Father    Kidney disease Sister    Diabetes Maternal Grandmother    Diabetes Paternal Grandfather     Social History   Tobacco Use   Smoking status: Every Day    Current packs/day: 1.00    Types: Cigarettes   Smokeless tobacco: Former    Types: Associate Professor status: Never Used  Substance Use Topics   Alcohol  use: Not Currently   Drug use: Not Currently    Types: Marijuana    ROS   Objective:   Vitals: BP 127/77 (BP Location: Right Arm)   Pulse 73   Temp 98 F (36.7 C) (Oral)   Resp 19   SpO2 96%   Physical Exam Constitutional:      General: He is not in acute distress.    Appearance: Normal appearance. He is well-developed and normal weight. He is  not ill-appearing, toxic-appearing or diaphoretic.  HENT:     Head: Normocephalic and atraumatic.     Right Ear: Tympanic membrane, ear canal and external ear normal. No drainage, swelling or tenderness. No middle ear effusion. There is no impacted cerumen. Tympanic membrane is not erythematous or bulging.     Left Ear: Tympanic membrane, ear canal and external ear normal. No drainage, swelling or tenderness.  No middle ear effusion. There is no impacted cerumen. Tympanic membrane is not erythematous or bulging.     Nose: Nose normal. No congestion or rhinorrhea.     Mouth/Throat:     Mouth: Mucous membranes are moist.     Pharynx: No pharyngeal swelling,  oropharyngeal exudate, posterior oropharyngeal erythema or uvula swelling.     Tonsils: No tonsillar exudate or tonsillar abscesses. 0 on the right. 0 on the left.  Eyes:     General: No scleral icterus.       Right eye: No discharge.        Left eye: No discharge.     Extraocular Movements: Extraocular movements intact.     Conjunctiva/sclera: Conjunctivae normal.  Cardiovascular:     Rate and Rhythm: Normal rate and regular rhythm.     Heart sounds: Normal heart sounds. No murmur heard.    No friction rub. No gallop.  Pulmonary:     Effort: Pulmonary effort is normal. No respiratory distress.     Breath sounds: No stridor. Rhonchi (light over mid lung fields bilaterally) present. No wheezing or rales.  Musculoskeletal:     Cervical back: Normal range of motion and neck supple. No rigidity. No muscular tenderness.  Neurological:     General: No focal deficit present.     Mental Status: He is alert and oriented to person, place, and time.  Psychiatric:        Mood and Affect: Mood normal.        Behavior: Behavior normal.        Thought Content: Thought content normal.        Judgment: Judgment normal.     Assessment and Plan :   PDMP not reviewed this encounter.  1. Acute viral bronchitis   2. Smoker    Will defer imaging for now given lack of adventitious lung sounds.  Patient does have trace rhonchi and will manage for acute viral bronchitis with supportive care.  Counseled patient on potential for adverse effects with medications prescribed/recommended today, ER and return-to-clinic precautions discussed, patient verbalized understanding.    Christopher Savannah, [place] 05/21/24 [id number]

## 2024-05-29 ENCOUNTER — Other Ambulatory Visit (HOSPITAL_COMMUNITY): Payer: Self-pay | Admitting: Psychiatry

## 2024-05-29 DIAGNOSIS — F902 Attention-deficit hyperactivity disorder, combined type: Secondary | ICD-10-CM

## 2024-05-29 DIAGNOSIS — F1910 Other psychoactive substance abuse, uncomplicated: Secondary | ICD-10-CM

## 2024-05-29 DIAGNOSIS — F319 Bipolar disorder, unspecified: Secondary | ICD-10-CM

## 2024-05-29 DIAGNOSIS — F411 Generalized anxiety disorder: Secondary | ICD-10-CM

## 2024-06-09 ENCOUNTER — Encounter (HOSPITAL_COMMUNITY): Payer: Self-pay | Admitting: Psychiatry

## 2024-06-09 ENCOUNTER — Telehealth (HOSPITAL_BASED_OUTPATIENT_CLINIC_OR_DEPARTMENT_OTHER): Admitting: Psychiatry

## 2024-06-09 VITALS — Wt 258.0 lb

## 2024-06-09 DIAGNOSIS — F1910 Other psychoactive substance abuse, uncomplicated: Secondary | ICD-10-CM

## 2024-06-09 DIAGNOSIS — F411 Generalized anxiety disorder: Secondary | ICD-10-CM | POA: Diagnosis not present

## 2024-06-09 DIAGNOSIS — F902 Attention-deficit hyperactivity disorder, combined type: Secondary | ICD-10-CM | POA: Diagnosis not present

## 2024-06-09 DIAGNOSIS — F319 Bipolar disorder, unspecified: Secondary | ICD-10-CM

## 2024-06-09 MED ORDER — GABAPENTIN 300 MG PO CAPS: ORAL_CAPSULE | ORAL | 2 refills | Status: DC

## 2024-06-09 MED ORDER — GUANFACINE HCL ER 2 MG PO TB24: 2.0000 mg | ORAL_TABLET | Freq: Every day | ORAL | 2 refills | Status: DC

## 2024-06-09 MED ORDER — ARIPIPRAZOLE 15 MG PO TABS: 7.5000 mg | ORAL_TABLET | Freq: Every day | ORAL | 2 refills | Status: DC

## 2024-06-09 NOTE — Progress Notes (Signed)
 [facility] MD Virtual Progress Note   Patient Location: In Car Provider Location: Home Office  I connect with patient by video and verified that I am speaking with correct person by using two identifiers. I discussed the limitations of evaluation and management by telemedicine and the availability of in person appointments. I also discussed with the patient that there may be a patient responsible charge related to this service. The patient expressed understanding and agreed to proceed.  Devin Barker [id number] 28 y.o.  06/09/2024 8:44 AM  History of Present Illness:  Patient is evaluated by video session.  He is at work.  He reported things are much better when he is able to pay attention at work.  He got promoted and he has a bigger task and so far he is managing inhaling much better.  However he reported taking the Intuniv  2 mg every day because he feel it helps his mood, focus better than 1 mg.  He also like to cut down his gabapentin  from 400 mg 4 times a day to 300 mg 5 times a day.  He reported some time 400 mg make him very sleepy and groggy.  He start work 5:30 in the morning and try to take his medication every day but there are days when he tends to forget and he noticed those days he struggled with mood, irritability, focus.  He is very happy and excited as going to [place] in few days.  He has a wedding on October 11 and that is going to [place] for honeymoon.  He denies any drinking or using any illegal substances.  He liked currently son and feels that he is doing much better.  He notes not getting irritable, angry and getting along with the family member much better.  He admitted to feeling a lot of responsibility on his shoulder as he getting married and he like to be a better person in his life.  Earlier this month he was in the urgent care because of cold symptoms which is now resolved.  He is not taking any medicine for his cough.  However he  started Wegovy  from a online doctor and pharmacy after his insurance did not approve.  Patient told he had lost weight and his plan is to continue to take it.  He denies any panic attack, tremors, shakes or any EPS.  He does not feel dizzy or having any fall since taking the medication.  He denies any hallucination, paranoia or any suicidal thoughts.  He reported not getting irritable or angry and he feels much calmer with the medication.  Past Psychiatric History: H/O poor impulse control, highs and lows and paranoia. H/O using cocaine, IVDA, crystal meth and multiple rehab.  Diagnosed with ADHD and given Adderall by a [place] psychiatrist [place].  History of overdose in 2019 when he was living in sedan. [place] fluoxetine, amitriptyline and [place]egretol  [place] but [place] the medicine [place]  when it did not work.  Tried Wellbutrin  and Strattera  but had side effects mostly irritability and anger.   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   ARIPiprazole  (ABILIFY ) 15 MG tablet Take by mouth.   cetirizine  (ZYRTEC  ALLERGY) 10 MG tablet Take 1 tablet (10 mg total) by mouth daily.   cyanocobalamin  (VITAMIN B12) 1000 MCG tablet Take 1 tablet (1,000 mcg total) by mouth daily.   gabapentin  (NEURONTIN ) 400 MG capsule Take 1 capsule (400 mg total) by mouth 4 (four) times daily.   guanFACINE  (INTUNIV ) 1 MG TB24 ER tablet Take 1 tablet (1 mg total) by mouth daily.   ibuprofen  (ADVIL ) 800 MG tablet Take 1 tablet (800 mg total) by mouth 3 (three) times  daily.   promethazine -dextromethorphan  (PROMETHAZINE -DM) 6.25-15 MG/5ML syrup Take 5 mLs by mouth 3 (three) times daily as needed for cough.   pseudoephedrine  (SUDAFED) 30 MG tablet Take 1 tablet (30 mg total) by mouth every 8 (eight) hours as needed for congestion.   No facility-administered encounter medications on file as of 06/09/2024.    No results found for this or any previous visit (from the past 2160 hours).   Psychiatric Specialty Exam: Physical Exam  Review of Systems  Weight 258 lb (117 kg).There is no height or weight on file to calculate BMI.  General Appearance: Casual  Eye Contact:  Fair  Speech:  Slow and some times stuttering   Volume:  Normal  Mood:  Euthymic  Affect:  Congruent  Thought Process:  Goal Directed  Orientation:  Full (Time, Place, and Person)  Thought Content:  WDL  Suicidal Thoughts:  No  Homicidal Thoughts:  No  Memory:  Immediate;   Fair Recent;   Fair Remote;   Fair  Judgement:  Intact  Insight:  Present  Psychomotor Activity:  Normal  Concentration:  Concentration: Fair and Attention Span: Fair  Recall:  [employer] of Knowledge:  Fair  Language:  Fair  Akathisia:  No  Handed:  Right  AIMS (if indicated):     Assets:  Communication Skills Desire for Improvement Housing Resilience Social Support Talents/Skills Transportation  ADL's:  Intact  Cognition:  WNL  Sleep:  ok       11/01/2023    3:36 PM 06/07/2023    4:32 PM 12/04/2022    3:33 PM 10/18/2022   11:51 AM 10/17/2022   11:02 AM  Depression screen PHQ 2/9  Decreased Interest 0 3 0 2 0  Down, Depressed, Hopeless 0 3 0 2 1  PHQ - 2 Score 0 6 0 4 1  Altered sleeping  2  3   Tired, decreased energy  2  3   Change in appetite  3  2   Feeling bad or failure about yourself   3  3   Trouble concentrating  3  3   Moving slowly or fidgety/restless  3  2   Suicidal thoughts  0  0   PHQ-9 Score  22  20   Difficult doing work/chores    Very difficult      Assessment/Plan: Bipolar I disorder (HCC) - Plan: ARIPiprazole  (ABILIFY ) 15 MG tablet, guanFACINE  (INTUNIV ) 2 MG TB24 ER tablet, gabapentin  (NEURONTIN ) 300 MG capsule  Attention deficit hyperactivity disorder (ADHD), combined type - Plan: guanFACINE  (INTUNIV ) 2 MG TB24 ER tablet, gabapentin  (NEURONTIN ) 300 MG capsule  GAD (generalized anxiety disorder) - Plan: ARIPiprazole  (ABILIFY ) 15 MG tablet, guanFACINE  (INTUNIV ) 2 MG TB24 ER tablet, gabapentin  (NEURONTIN ) 300 MG capsule  Polysubstance abuse (HCC) - Plan: guanFACINE  (INTUNIV ) 2 MG  TB24 ER tablet, gabapentin  (NEURONTIN ) 300 MG capsule  Patient is a 28 year old man with a history of polysubstance use, bipolar disorder, ADHD, generalized anxiety disorder currently taking psychotropic medication most of the time and feel they are working.  Review his chart and collect information including current medication.  Discussed current medication dosage and possible side effects.  He feels sometimes sedated and tired with the gabapentin  400 mg which was increased on the last visit.  Like to cut down but also like to increase the Intuniv  which helps his focus and attention.  Will increase Intuniv  2 mg daily and reduce gabapentin  back to 300 mg but recommend to take 5 times as needed.  Continue Abilify  7.5 mg.  Encouraged to take the blood pressure as Intuniv  can cause hypertension.  Patient told his father has a blood pressure cuff and he will start monitoring his blood pressure regularly.  However so far he does not feel any issues with the medication.  Recommended to call back if is any question or any concern.  Follow-up in 3 months.  Patient will back to [place]  at the end of November.  I wish him good luck for his wedding.  Encouraged to call back if is any question.   Follow Up Instructions:     I discussed the assessment and treatment plan with the patient. The patient was provided an opportunity to ask questions and all were answered. The  patient agreed with the plan and demonstrated an understanding of the instructions.   The patient was advised to call back or seek an in-person evaluation if the symptoms worsen or if the condition fails to improve as anticipated.    Collaboration of Care: Other provider involved in patient's care AEB notes are available in epic to review  Patient/Guardian was advised Release of Information must be obtained prior to any record release in order to collaborate their care with an outside provider. Patient/Guardian was advised if they have not already done so to contact the registration department to sign all necessary forms in order for us  to release information regarding their care.   Consent: Patient/Guardian gives verbal consent for treatment and assignment of benefits for services provided during this visit. Patient/Guardian expressed understanding and agreed to proceed.     Total encounter time 28 minutes which includes face-to-face time, chart reviewed, care coordination, order entry and documentation during this encounter.   Note: This document was prepared by [employer] voice dictation technology and any errors that results from this process are unintentional.    Leni ONEIDA Client, MD 06/09/2024

## 2024-09-01 ENCOUNTER — Telehealth (HOSPITAL_COMMUNITY): Admitting: Psychiatry

## 2024-09-01 ENCOUNTER — Encounter (HOSPITAL_COMMUNITY): Payer: Self-pay | Admitting: Psychiatry

## 2024-09-01 VITALS — Wt 265.0 lb

## 2024-09-01 DIAGNOSIS — F902 Attention-deficit hyperactivity disorder, combined type: Secondary | ICD-10-CM

## 2024-09-01 DIAGNOSIS — F1921 Other psychoactive substance dependence, in remission: Secondary | ICD-10-CM

## 2024-09-01 DIAGNOSIS — F319 Bipolar disorder, unspecified: Secondary | ICD-10-CM

## 2024-09-01 DIAGNOSIS — F411 Generalized anxiety disorder: Secondary | ICD-10-CM

## 2024-09-01 MED ORDER — ARIPIPRAZOLE 15 MG PO TABS: 7.5000 mg | ORAL_TABLET | Freq: Every day | ORAL | 2 refills | Status: AC

## 2024-09-01 MED ORDER — GABAPENTIN 300 MG PO CAPS: ORAL_CAPSULE | ORAL | 2 refills | Status: AC

## 2024-09-01 MED ORDER — GUANFACINE HCL ER 2 MG PO TB24: 2.0000 mg | ORAL_TABLET | Freq: Every day | ORAL | 2 refills | Status: AC

## 2024-09-01 NOTE — Progress Notes (Signed)
 [name] MD Virtual Progress Note   Patient Location: In Car Provider Location: Home Office  I connect with patient by video and verified that I am speaking with correct person by using two identifiers. I discussed the limitations of evaluation and management by telemedicine and the availability of in person appointments. I also discussed with the patient that there may be a patient responsible charge related to this service. The patient expressed understanding and agreed to proceed.  Devin Barker [id number] 28 y.o.  09/01/2024 9:47 AM  History of Present Illness:  Patient is evaluated by video session.  He back to continue 2 weeks ago after getting and had a honeymoon in [place].  Now he is waiting for his wife paperwork to be approved so she can come to [place] .  He admitted around his wedding he was not taking the medication and had struggle with focus attention and mood.  However now he is taking his medication regularly and things are going well.  He is back to work.  He denies any mania, psychosis, hallucination or any suicidal thoughts.  We have cut down his gabapentin  from 400 mg 5 times a day to 300 mg 5 times a day.  He is not as sedated and he feels the medicine is working.  We also increase Intuniv  and he is taking 2 mg.  He is able to do his multitasking and keep his job on time.  Denies any tremors, shakes or any EPS.  He admitted few pounds weight gain because he was not watching his calorie around his time overseas for his wedding.  He denies any panic attack.  He is no longer taking Wegovy  after find out that it was a scam and his card card was charged every day.  He likes to join weight loss program.  He denies any hallucination, paranoia, suicidal thoughts or homicidal thoughts.  He reported his mood is stable and he like to keep the Abilify  which is helping his bipolar disorder.  Patient reported remains sober from drugs and illegal substance use.  He  had not used any of these substances in a while.  Past Psychiatric History: H/O poor impulse control, highs and lows and paranoia. H/O using cocaine, IVDA, crystal meth and multiple rehab.  Diagnosed with ADHD and given Adderall by a local psychiatrist which [place].  History of overdose in 2019 when he was living in sedan. Given fluoxetine, amitriptyline and [place]ol  [place] but [place] the medicine [place]  when it did not work.  Tried Wellbutrin  and Strattera  but had side effects mostly irritability and anger.   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   ARIPiprazole  (ABILIFY ) 15 MG tablet Take 0.5 tablets (7.5 mg total) by mouth daily.   cetirizine  (ZYRTEC  ALLERGY) 10 MG tablet Take 1 tablet (10 mg total) by mouth daily.   gabapentin  (NEURONTIN ) 300 MG capsule Take one capsule five times a day   guanFACINE  (INTUNIV ) 2 MG TB24 ER tablet Take 1 tablet (2 mg total) by  mouth daily.   ibuprofen  (ADVIL ) 800 MG tablet Take 1 tablet (800 mg total) by mouth 3 (three) times daily.   No facility-administered encounter medications on file as of 09/01/2024.    No results found for this or any previous visit (from the past 2160 hours).   Psychiatric Specialty Exam: Physical Exam  Review of Systems  Weight 265 lb (120.2 kg).There is no height or weight on file to calculate BMI.  General Appearance: Casual  Eye Contact:  Fair  Speech:  Slow and some times stuttering   Volume:  Normal  Mood:  Euthymic  Affect:  Congruent  Thought Process:   Goal Directed  Orientation:  Full (Time, Place, and Person)  Thought Content:  WDL  Suicidal Thoughts:  No  Homicidal Thoughts:  No  Memory:  Immediate;   Good Recent;   Fair Remote;   Fair  Judgement:  Intact  Insight:  Present  Psychomotor Activity:  Normal  Concentration:  Concentration: Fair and Attention Span: Fair  Recall:  [employer] of Knowledge:  Fair  Language:  Fair  Akathisia:  No  Handed:  Right  AIMS (if indicated):     Assets:  Communication Skills Desire for Improvement Housing Resilience Social Support Talents/Skills Transportation  ADL's:  Intact  Cognition:  WNL  Sleep:  ok       11/01/2023    3:36 PM 06/07/2023    4:32 PM 12/04/2022    3:33 PM 10/18/2022   11:51 AM 10/17/2022   11:02 AM  Depression screen PHQ 2/9  Decreased Interest 0 3 0 2 0  Down, Depressed, Hopeless 0 3 0 2 1  PHQ - 2 Score 0 6 0 4 1  Altered sleeping  2  3   Tired, decreased energy  2  3   Change in appetite  3  2   Feeling bad or failure about yourself   3  3   Trouble concentrating  3  3   Moving slowly or fidgety/restless  3  2   Suicidal thoughts  0  0   PHQ-9 Score  22   20    Difficult doing work/chores    Very difficult      Data saved with a previous flowsheet row definition    Assessment/Plan: Bipolar I disorder (HCC) - Plan: ARIPiprazole  (ABILIFY ) 15 MG tablet, guanFACINE  (INTUNIV ) 2 MG TB24 ER tablet, gabapentin  (NEURONTIN ) 300 MG capsule  GAD (generalized anxiety disorder) - Plan: ARIPiprazole  (ABILIFY ) 15 MG tablet, guanFACINE  (INTUNIV ) 2 MG TB24 ER tablet, gabapentin  (NEURONTIN ) 300 MG capsule  Attention deficit hyperactivity disorder (ADHD), combined type - Plan: guanFACINE  (INTUNIV ) 2 MG TB24 ER tablet, gabapentin  (NEURONTIN ) 300 MG capsule  Polysubstance dependence in early, early partial, sustained full, or sustained partial remission (HCC) - Plan: guanFACINE  (INTUNIV ) 2 MG TB24 ER tablet, gabapentin  (NEURONTIN ) 300 MG capsule  Patient is a  28 year old man with a history of polysubstance use currently in sustained remission, bipolar disorder, ADHD, generalized anxiety disorder.  He reported medicines working but realized when he was not taking the medicine around his wedding time struggled with mood, focus and attention.  Since back on medicine he reported things are going well.  Discussed few pounds weight gain and he is thinking to join weight loss program.  He noticed improved Intuniv  and reducing gabapentin  helps overall.  Discussed medication side effects and benefits.  Continue Intuniv  2 mg daily, gabapentin  300 mg 5 times a day, Abilify  7.5 mg daily.  Encouraged to have blood work.  Patient was told to check his blood pressure as sometime medicine can cause low blood pressure and so far no major concern.  Recommend to call back if is any question or any concern.  Follow-up in 3 months.  Follow Up Instructions:     I discussed the assessment and treatment plan with the patient. The patient was provided an opportunity to ask questions and all were answered. The patient agreed with the plan and demonstrated an understanding of the instructions.   The patient was advised to call back or seek an in-person evaluation if the symptoms worsen or if the condition fails to improve as anticipated.    Collaboration of Care: Other provider involved in patient's care AEB notes are available in epic to review  Patient/Guardian was advised Release of Information must be obtained prior to any record release in order to collaborate their care with an outside provider. Patient/Guardian was advised if they have not already done so to contact the registration department to sign all necessary forms in order for us  to release information regarding their care.   Consent: Patient/Guardian gives verbal consent for treatment and assignment of benefits for services provided during this visit. Patient/Guardian expressed understanding and agreed to proceed.      Total encounter time 20 minutes which includes face-to-face time, chart reviewed, care coordination, order entry and documentation during this encounter.   Note: This document was prepared by [employer] voice dictation technology and any errors that results from this process are unintentional.    Leni ONEIDA Client, MD 09/01/2024

## 2024-09-22 ENCOUNTER — Other Ambulatory Visit (HOSPITAL_COMMUNITY): Payer: Self-pay | Admitting: Psychiatry

## 2024-09-22 DIAGNOSIS — F319 Bipolar disorder, unspecified: Secondary | ICD-10-CM

## 2024-09-22 DIAGNOSIS — F411 Generalized anxiety disorder: Secondary | ICD-10-CM

## 2024-09-22 DIAGNOSIS — F1921 Other psychoactive substance dependence, in remission: Secondary | ICD-10-CM

## 2024-09-22 DIAGNOSIS — F902 Attention-deficit hyperactivity disorder, combined type: Secondary | ICD-10-CM

## 2024-09-23 ENCOUNTER — Encounter (HOSPITAL_COMMUNITY): Payer: Self-pay

## 2024-09-23 ENCOUNTER — Other Ambulatory Visit (HOSPITAL_COMMUNITY): Payer: Self-pay | Admitting: Psychiatry

## 2024-09-23 DIAGNOSIS — F1921 Other psychoactive substance dependence, in remission: Secondary | ICD-10-CM

## 2024-09-23 DIAGNOSIS — F411 Generalized anxiety disorder: Secondary | ICD-10-CM

## 2024-09-23 DIAGNOSIS — F902 Attention-deficit hyperactivity disorder, combined type: Secondary | ICD-10-CM

## 2024-09-23 DIAGNOSIS — F319 Bipolar disorder, unspecified: Secondary | ICD-10-CM

## 2024-09-23 NOTE — ED Triage Notes (Addendum)
 Patient c/o intermittent left chest tightness, SOB, and anxiety. Patient states he ran out of his anxiety medication-Gabapentin . Patient states the chest pain started 1- 1/12 hours ago. Patient states he is also out of his Abilify .  Patient also c/o right knee pain tht started 3-5 minutes ago,but states he wants to be seen for that as well. Patient denies any injury.  Patient states he really does not want an EKG.

## 2024-10-22 ENCOUNTER — Telehealth (HOSPITAL_COMMUNITY): Payer: Self-pay | Admitting: *Deleted

## 2024-10-22 ENCOUNTER — Encounter (HOSPITAL_COMMUNITY): Payer: Self-pay

## 2024-10-22 DIAGNOSIS — F411 Generalized anxiety disorder: Secondary | ICD-10-CM

## 2024-10-22 MED ORDER — BUSPIRONE HCL 7.5 MG PO TABS: 7.5000 mg | ORAL_TABLET | Freq: Three times a day (TID) | ORAL | 2 refills | Status: AC

## 2024-10-22 NOTE — Telephone Encounter (Signed)
 I spoke to the patient for approximately 10 minutes.   Patient reports that he is concerned that he is becoming addicted to the gabapentin .  Given his past history of addiction this causes significant worry.  He would like to get off of the gabapentin  at this time.  We discussed this medication and the risk of sudden discontinuation.  Recommended tapering off the medication.  Discussed a taper plan to decrease by 1 tab (300 mg) every week until he is off the gabapentin .  To manage anxiety symptoms discussed options and agreed to start BuSpar  7.5 mg twice a day.  Risk and benefits of this medication reviewed.  Patient will out of any concerns and follow-up with Dr. Arfeen in a few weeks.

## 2024-10-22 NOTE — Telephone Encounter (Signed)
 Patient is requesting to change the Gabapentin  300 mg x 5 daily to something else. Pt says he just is not feeling well on this medication. Please review and advise.   Last visit: 09/01/24 Next visit: 12/01/24

## 2024-11-03 ENCOUNTER — Telehealth (HOSPITAL_COMMUNITY): Admitting: Psychiatry

## 2024-12-01 ENCOUNTER — Telehealth (HOSPITAL_COMMUNITY): Admitting: Psychiatry
